# Patient Record
Sex: Female | Born: 1945
Health system: Southern US, Community
[De-identification: ages and names within clinical notes are randomized; demographics above are authoritative.]

## PROBLEM LIST (undated history)

## (undated) DIAGNOSIS — I10 Essential (primary) hypertension: Secondary | ICD-10-CM

## (undated) DIAGNOSIS — K449 Diaphragmatic hernia without obstruction or gangrene: Secondary | ICD-10-CM

## (undated) DIAGNOSIS — K224 Dyskinesia of esophagus: Secondary | ICD-10-CM

## (undated) DIAGNOSIS — I4891 Unspecified atrial fibrillation: Secondary | ICD-10-CM

## (undated) HISTORY — DX: Essential (primary) hypertension: I10

## (undated) HISTORY — DX: Dyskinesia of esophagus: K22.4

## (undated) HISTORY — DX: Diaphragmatic hernia without obstruction or gangrene: K44.9

## (undated) HISTORY — DX: Unspecified atrial fibrillation: I48.91

---
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
## 1988-01-05 HISTORY — PX: ABDOMINAL HYSTERECTOMY: SHX81

## 2000-01-26 ENCOUNTER — Encounter: Admission: RE | Admit: 2000-01-26 | Discharge: 2000-01-26 | Payer: Self-pay | Admitting: Internal Medicine

## 2000-01-26 ENCOUNTER — Encounter: Payer: Self-pay | Admitting: Obstetrics and Gynecology

## 2000-03-02 ENCOUNTER — Encounter: Admission: RE | Admit: 2000-03-02 | Discharge: 2000-03-02 | Payer: Self-pay | Admitting: Obstetrics and Gynecology

## 2000-03-02 ENCOUNTER — Encounter: Payer: Self-pay | Admitting: Obstetrics and Gynecology

## 2000-04-04 ENCOUNTER — Encounter: Admission: RE | Admit: 2000-04-04 | Discharge: 2000-04-04 | Payer: Self-pay | Admitting: Internal Medicine

## 2000-04-04 ENCOUNTER — Encounter: Payer: Self-pay | Admitting: Internal Medicine

## 2000-07-28 ENCOUNTER — Encounter: Payer: Self-pay | Admitting: Obstetrics and Gynecology

## 2000-07-28 ENCOUNTER — Encounter: Admission: RE | Admit: 2000-07-28 | Discharge: 2000-07-28 | Payer: Self-pay | Admitting: Obstetrics and Gynecology

## 2000-09-26 ENCOUNTER — Other Ambulatory Visit: Admission: RE | Admit: 2000-09-26 | Discharge: 2000-09-26 | Payer: Self-pay | Admitting: Obstetrics and Gynecology

## 2001-02-07 ENCOUNTER — Encounter: Payer: Self-pay | Admitting: *Deleted

## 2001-02-07 ENCOUNTER — Encounter: Admission: RE | Admit: 2001-02-07 | Discharge: 2001-02-07 | Payer: Self-pay | Admitting: *Deleted

## 2001-09-08 LAB — HM PAP SMEAR

## 2001-09-18 ENCOUNTER — Other Ambulatory Visit: Admission: RE | Admit: 2001-09-18 | Discharge: 2001-09-18 | Payer: Self-pay | Admitting: *Deleted

## 2002-03-22 ENCOUNTER — Encounter: Payer: Self-pay | Admitting: *Deleted

## 2002-03-22 ENCOUNTER — Encounter: Admission: RE | Admit: 2002-03-22 | Discharge: 2002-03-22 | Payer: Self-pay | Admitting: *Deleted

## 2002-12-13 ENCOUNTER — Encounter: Admission: RE | Admit: 2002-12-13 | Discharge: 2002-12-13 | Payer: Self-pay | Admitting: *Deleted

## 2004-01-09 ENCOUNTER — Encounter: Admission: RE | Admit: 2004-01-09 | Discharge: 2004-01-09 | Payer: Self-pay | Admitting: Internal Medicine

## 2004-12-01 ENCOUNTER — Encounter: Admission: RE | Admit: 2004-12-01 | Discharge: 2004-12-01 | Payer: Self-pay | Admitting: Obstetrics and Gynecology

## 2004-12-21 ENCOUNTER — Encounter: Admission: RE | Admit: 2004-12-21 | Discharge: 2004-12-21 | Payer: Self-pay | Admitting: Internal Medicine

## 2004-12-31 ENCOUNTER — Encounter: Admission: RE | Admit: 2004-12-31 | Discharge: 2004-12-31 | Payer: Self-pay | Admitting: Internal Medicine

## 2005-01-11 ENCOUNTER — Ambulatory Visit (HOSPITAL_COMMUNITY): Admission: RE | Admit: 2005-01-11 | Discharge: 2005-01-11 | Payer: Self-pay | Admitting: Internal Medicine

## 2005-01-15 ENCOUNTER — Ambulatory Visit (HOSPITAL_COMMUNITY): Admission: RE | Admit: 2005-01-15 | Discharge: 2005-01-15 | Payer: Self-pay | Admitting: Internal Medicine

## 2005-08-12 ENCOUNTER — Encounter: Admission: RE | Admit: 2005-08-12 | Discharge: 2005-08-12 | Payer: Self-pay | Admitting: Internal Medicine

## 2005-12-15 ENCOUNTER — Encounter: Admission: RE | Admit: 2005-12-15 | Discharge: 2005-12-15 | Payer: Self-pay | Admitting: Obstetrics and Gynecology

## 2007-01-11 ENCOUNTER — Encounter: Admission: RE | Admit: 2007-01-11 | Discharge: 2007-01-11 | Payer: Self-pay | Admitting: Internal Medicine

## 2008-01-16 ENCOUNTER — Encounter: Admission: RE | Admit: 2008-01-16 | Discharge: 2008-01-16 | Payer: Self-pay | Admitting: Internal Medicine

## 2008-10-02 ENCOUNTER — Ambulatory Visit (HOSPITAL_COMMUNITY): Admission: RE | Admit: 2008-10-02 | Discharge: 2008-10-02 | Payer: Self-pay | Admitting: Gastroenterology

## 2008-10-02 ENCOUNTER — Encounter (INDEPENDENT_AMBULATORY_CARE_PROVIDER_SITE_OTHER): Payer: Self-pay | Admitting: Gastroenterology

## 2009-02-06 ENCOUNTER — Encounter: Admission: RE | Admit: 2009-02-06 | Discharge: 2009-02-06 | Payer: Self-pay | Admitting: Obstetrics and Gynecology

## 2009-03-04 ENCOUNTER — Ambulatory Visit: Payer: Self-pay | Admitting: Internal Medicine

## 2009-03-18 ENCOUNTER — Ambulatory Visit: Payer: Self-pay | Admitting: Internal Medicine

## 2009-09-02 ENCOUNTER — Ambulatory Visit: Payer: Self-pay | Admitting: Internal Medicine

## 2010-01-24 ENCOUNTER — Other Ambulatory Visit: Payer: Self-pay | Admitting: Internal Medicine

## 2010-01-24 DIAGNOSIS — Z1239 Encounter for other screening for malignant neoplasm of breast: Secondary | ICD-10-CM

## 2010-02-10 ENCOUNTER — Ambulatory Visit
Admission: RE | Admit: 2010-02-10 | Discharge: 2010-02-10 | Disposition: A | Discharge status: Home / Self Care | Payer: Commercial Managed Care - PPO | Source: Ambulatory Visit | Attending: Internal Medicine | Admitting: Internal Medicine

## 2010-02-10 DIAGNOSIS — Z1239 Encounter for other screening for malignant neoplasm of breast: Secondary | ICD-10-CM

## 2010-02-24 ENCOUNTER — Ambulatory Visit: Payer: Self-pay | Admitting: Internal Medicine

## 2010-03-27 ENCOUNTER — Ambulatory Visit (INDEPENDENT_AMBULATORY_CARE_PROVIDER_SITE_OTHER): Payer: Commercial Managed Care - PPO | Admitting: Internal Medicine

## 2010-03-27 DIAGNOSIS — I10 Essential (primary) hypertension: Secondary | ICD-10-CM

## 2010-03-27 DIAGNOSIS — K219 Gastro-esophageal reflux disease without esophagitis: Secondary | ICD-10-CM

## 2010-03-27 DIAGNOSIS — E119 Type 2 diabetes mellitus without complications: Secondary | ICD-10-CM

## 2010-03-27 DIAGNOSIS — F411 Generalized anxiety disorder: Secondary | ICD-10-CM

## 2010-10-06 ENCOUNTER — Encounter: Payer: Self-pay | Admitting: Internal Medicine

## 2010-10-08 ENCOUNTER — Other Ambulatory Visit: Payer: 59 | Admitting: Internal Medicine

## 2010-10-08 DIAGNOSIS — Z0000 Encounter for general adult medical examination without abnormal findings: Secondary | ICD-10-CM

## 2010-10-08 DIAGNOSIS — E119 Type 2 diabetes mellitus without complications: Secondary | ICD-10-CM

## 2010-10-08 LAB — COMPREHENSIVE METABOLIC PANEL
ALT: 22 U/L (ref 0–35)
AST: 22 U/L (ref 0–37)
Albumin: 4.5 g/dL (ref 3.5–5.2)
Alkaline Phosphatase: 145 U/L — ABNORMAL HIGH (ref 39–117)
BUN: 11 mg/dL (ref 6–23)
CO2: 27 mEq/L (ref 19–32)
Calcium: 9.5 mg/dL (ref 8.4–10.5)
Chloride: 102 mEq/L (ref 96–112)
Creat: 0.73 mg/dL (ref 0.50–1.10)
Glucose, Bld: 129 mg/dL — ABNORMAL HIGH (ref 70–99)
Potassium: 4.8 mEq/L (ref 3.5–5.3)
Sodium: 141 mEq/L (ref 135–145)
Total Bilirubin: 0.4 mg/dL (ref 0.3–1.2)
Total Protein: 6.8 g/dL (ref 6.0–8.3)

## 2010-10-08 LAB — CBC WITH DIFFERENTIAL/PLATELET
Basophils Absolute: 0 10*3/uL (ref 0.0–0.1)
Basophils Relative: 1 % (ref 0–1)
Eosinophils Absolute: 0.1 10*3/uL (ref 0.0–0.7)
Eosinophils Relative: 2 % (ref 0–5)
HCT: 45 % (ref 36.0–46.0)
Hemoglobin: 14.9 g/dL (ref 12.0–15.0)
Lymphocytes Relative: 24 % (ref 12–46)
Lymphs Abs: 1.6 10*3/uL (ref 0.7–4.0)
MCH: 30.4 pg (ref 26.0–34.0)
MCHC: 33.1 g/dL (ref 30.0–36.0)
MCV: 91.8 fL (ref 78.0–100.0)
Monocytes Absolute: 0.4 10*3/uL (ref 0.1–1.0)
Monocytes Relative: 6 % (ref 3–12)
Neutro Abs: 4.5 10*3/uL (ref 1.7–7.7)
Neutrophils Relative %: 68 % (ref 43–77)
Platelets: 251 10*3/uL (ref 150–400)
RBC: 4.9 MIL/uL (ref 3.87–5.11)
RDW: 14 % (ref 11.5–15.5)
WBC: 6.6 10*3/uL (ref 4.0–10.5)

## 2010-10-08 LAB — LIPID PANEL
Cholesterol: 183 mg/dL (ref 0–200)
HDL: 51 mg/dL (ref >39)
LDL Cholesterol: 111 mg/dL — ABNORMAL HIGH (ref 0–99)
Total CHOL/HDL Ratio: 3.6 Ratio
Triglycerides: 103 mg/dL (ref <150)
VLDL: 21 mg/dL (ref 0–40)

## 2010-10-08 LAB — HEMOGLOBIN A1C
Hgb A1c MFr Bld: 6.4 % — ABNORMAL HIGH (ref <5.7)
Mean Plasma Glucose: 137 mg/dL — ABNORMAL HIGH (ref <117)

## 2010-10-08 LAB — TSH: TSH: 1.05 u[IU]/mL (ref 0.350–4.500)

## 2010-10-09 ENCOUNTER — Encounter: Payer: Self-pay | Admitting: Internal Medicine

## 2010-10-09 ENCOUNTER — Ambulatory Visit (INDEPENDENT_AMBULATORY_CARE_PROVIDER_SITE_OTHER): Payer: 59 | Admitting: Internal Medicine

## 2010-10-09 ENCOUNTER — Ambulatory Visit (HOSPITAL_COMMUNITY)
Admission: RE | Admit: 2010-10-09 | Discharge: 2010-10-09 | Disposition: A | Discharge status: Home / Self Care | Payer: 59 | Source: Ambulatory Visit | Attending: Internal Medicine | Admitting: Internal Medicine

## 2010-10-09 VITALS — BP 116/80 | HR 72 | Temp 99.0°F | Ht 63.0 in | Wt 261.0 lb

## 2010-10-09 DIAGNOSIS — K224 Dyskinesia of esophagus: Secondary | ICD-10-CM

## 2010-10-09 DIAGNOSIS — Z0000 Encounter for general adult medical examination without abnormal findings: Secondary | ICD-10-CM

## 2010-10-09 DIAGNOSIS — R0609 Other forms of dyspnea: Secondary | ICD-10-CM | POA: Insufficient documentation

## 2010-10-09 DIAGNOSIS — E119 Type 2 diabetes mellitus without complications: Secondary | ICD-10-CM

## 2010-10-09 DIAGNOSIS — R0989 Other specified symptoms and signs involving the circulatory and respiratory systems: Secondary | ICD-10-CM | POA: Insufficient documentation

## 2010-10-09 DIAGNOSIS — I10 Essential (primary) hypertension: Secondary | ICD-10-CM

## 2010-10-09 DIAGNOSIS — K209 Esophagitis, unspecified without bleeding: Secondary | ICD-10-CM

## 2010-10-09 DIAGNOSIS — R0600 Dyspnea, unspecified: Secondary | ICD-10-CM

## 2010-10-09 LAB — POCT URINALYSIS DIPSTICK
Bilirubin, UA: NEGATIVE
Blood, UA: NEGATIVE
Glucose, UA: NEGATIVE
Leukocytes, UA: NEGATIVE
Nitrite, UA: NEGATIVE
Protein, UA: NEGATIVE
Spec Grav, UA: 1.01
Urobilinogen, UA: NEGATIVE
pH, UA: 6

## 2010-10-09 LAB — VITAMIN D 25 HYDROXY (VIT D DEFICIENCY, FRACTURES): Vit D, 25-Hydroxy: 88 ng/mL (ref 30–89)

## 2010-11-04 DIAGNOSIS — K209 Esophagitis, unspecified without bleeding: Secondary | ICD-10-CM | POA: Insufficient documentation

## 2010-11-04 DIAGNOSIS — E119 Type 2 diabetes mellitus without complications: Secondary | ICD-10-CM | POA: Insufficient documentation

## 2010-11-04 DIAGNOSIS — R0600 Dyspnea, unspecified: Secondary | ICD-10-CM | POA: Insufficient documentation

## 2010-11-04 DIAGNOSIS — K224 Dyskinesia of esophagus: Secondary | ICD-10-CM | POA: Insufficient documentation

## 2010-11-04 DIAGNOSIS — I10 Essential (primary) hypertension: Secondary | ICD-10-CM | POA: Insufficient documentation

## 2010-11-04 NOTE — Progress Notes (Signed)
  Subjective:    Patient ID: Ebony Ashley, female    DOB: 1945-03-10, 65 y.o.   MRN: [id number]  HPI 65 year old white female with history of esophageal spasm, esophagitis, diabetes mellitus and hypertension for health maintenance and evaluation of medical problems. History of fractured left lower leg 1998. Total abdominal hysterectomy without oophorectomy for menorrhagia in 1991. Has had 2 D&C procedures. Had colonoscopy 2009 by Dr. Loreta Ave. Diabetic eye exam December 2010. Was reminded to do this. Tetanus immunization 2007, Pneumovax immunization August 2011. Patient complaining of some anterior chest pain. She is followed by [facility] [place]    Review of Systems  Constitutional: Negative.   HENT: Negative.   Eyes: Negative.   Cardiovascular: Positive for chest pain. Negative for palpitations and leg swelling.  Gastrointestinal: Negative.   Genitourinary: Negative.   Musculoskeletal: Negative.   Neurological: Negative.   Hematological: Negative.   Psychiatric/Behavioral: Negative.        Objective:   Physical Exam  Vitals reviewed. Constitutional: She is oriented to person, place, and time. She appears well-nourished.  HENT:  Head: Normocephalic and atraumatic.  Right Ear: External ear normal.  Left Ear: External ear normal.  Mouth/Throat: Oropharynx is clear and moist.  Eyes: Conjunctivae and EOM are normal. Pupils are equal, round, and reactive to light. No scleral icterus.  Neck: Normal range of motion. Neck supple. No JVD present. No thyromegaly present.       No carotid bruits  Cardiovascular: Normal rate and regular rhythm.   Murmur heard. Pulmonary/Chest: Effort normal and breath sounds normal. She has no wheezes. She has no rales.       Breasts normal female  Abdominal: Soft. Bowel sounds are normal. She exhibits no mass. There is no tenderness.  Musculoskeletal: She exhibits no edema.  Neurological: She is alert and oriented to person, place, and time. She  has normal reflexes. No cranial nerve deficit. Coordination normal.  Skin: Skin is warm and dry.  Psychiatric: She has a normal mood and affect. Her behavior is normal.          Assessment & Plan:  Hypertension  Diabetes mellitus  Recent episode of DOE  History of esophagitis and esophageal spasm  Systolic ejection murmur not previously noted  Plan: Cardiology evaluation. EKG performed today shows no change from EKG 2008

## 2010-11-04 NOTE — Patient Instructions (Signed)
Please continue to watch diet and try to exercise. We will arrange cardiology evaluation regarding shortness of breath and cardiac murmur. Return in 6 months for office visit in hemoglobin A 1C

## 2010-11-05 ENCOUNTER — Telehealth: Payer: Self-pay

## 2010-11-05 DIAGNOSIS — R011 Cardiac murmur, unspecified: Secondary | ICD-10-CM

## 2010-11-05 NOTE — Telephone Encounter (Signed)
Ambulatory order to Cardiology entered in Epic

## 2011-01-01 ENCOUNTER — Telehealth: Payer: Self-pay

## 2011-01-01 DIAGNOSIS — R011 Cardiac murmur, unspecified: Secondary | ICD-10-CM

## 2011-01-01 NOTE — Telephone Encounter (Signed)
Patient scheduled for an appointment with Dr. Antoine Poche at [facility] on Feb 09, 2011 at 3:30pm. Patient informed via mail.

## 2011-01-07 ENCOUNTER — Telehealth: Payer: Self-pay

## 2011-01-07 NOTE — Telephone Encounter (Signed)
Patient scheduled for an appointment with Dr. Antoine Poche on Feb 09, 2011 at 3:30 pm. Note mailed to her with this information.

## 2011-02-09 ENCOUNTER — Institutional Professional Consult (permissible substitution): Payer: 59 | Admitting: Cardiology

## 2011-03-01 ENCOUNTER — Encounter: Payer: Self-pay | Admitting: Cardiology

## 2011-03-01 ENCOUNTER — Ambulatory Visit (INDEPENDENT_AMBULATORY_CARE_PROVIDER_SITE_OTHER): Payer: 59 | Admitting: Cardiology

## 2011-03-01 VITALS — BP 140/80 | HR 57 | Ht 65.0 in | Wt 272.0 lb

## 2011-03-01 DIAGNOSIS — E669 Obesity, unspecified: Secondary | ICD-10-CM

## 2011-03-01 DIAGNOSIS — R011 Cardiac murmur, unspecified: Secondary | ICD-10-CM

## 2011-03-01 DIAGNOSIS — R0600 Dyspnea, unspecified: Secondary | ICD-10-CM

## 2011-03-01 DIAGNOSIS — I10 Essential (primary) hypertension: Secondary | ICD-10-CM

## 2011-03-01 NOTE — Assessment & Plan Note (Signed)
I suspect some mild septal hypertrophy.  I will order an echo and follow as needed based on these results.

## 2011-03-01 NOTE — Assessment & Plan Note (Signed)
The patient understands the need to lose weight with diet and exercise. We have discussed specific strategies for this.  

## 2011-03-01 NOTE — Progress Notes (Signed)
HPI  The patient presents for evaluation of a heart murmur. She has no prior cardiac history. She was told many years ago she had a heart murmur but Dr. Lenord Fellers noticed this recently as a new finding. The patient reports having an echo possibly a few years ago but doesn't recall the results. She's had no other cardiovascular testing. She does not exercise routinely but she takes care of her home. She might notice some shortness of breath when she's walking a moderate distance or up stairs. This is not new. She does not describe PND or orthopnea. She does not describe chest pressure, neck or arm discomfort. She doesn't notice palpitations, presyncope or syncope. She has had no edema.   Allergies  Allergen Reactions  . Adhesive (Tape) Rash  . Polysporin (Bacitracin-Polymyxin B) Rash    Current Outpatient Prescriptions  Medication Sig Dispense Refill  . amLODipine (NORVASC) 10 MG tablet Take 10 mg by mouth daily.        . Ascorbic Acid (VITAMIN C) 1000 MG tablet Take 1,000 mg by mouth daily.      Marland Kitchen CALCIUM PO Take 1,500 mg by mouth daily. Three tabs daily      . cholecalciferol (VITAMIN D) 1000 UNITS tablet Take 1,000 Units by mouth daily.        Marland Kitchen esomeprazole (NEXIUM) 40 MG capsule Take 40 mg by mouth daily before breakfast.        . fish oil-omega-3 fatty acids 1000 MG capsule Take 2 g by mouth daily.        Marland Kitchen FLAXSEED, LINSEED, PO Take by mouth daily.      . metoprolol (TOPROL-XL) 100 MG 24 hr tablet Take 100 mg by mouth daily.        . nitroGLYCERIN (NITROSTAT) 0.4 MG SL tablet Place 0.4 mg under the tongue every 5 (five) minutes as needed.        . solifenacin (VESICARE) 5 MG tablet Take 10 mg by mouth daily.        . vitamin E (VITAMIN E) 400 UNIT capsule Take 400 Units by mouth daily.        Past Medical History  Diagnosis Date  . Diabetes mellitus   . Esophageal spasm   . Esophagitis   . Hypertension     Past Surgical History  Procedure Date  . Abdominal hysterectomy   .  Tonsillectomy and adenoidectomy     Family History  Problem Relation Age of Onset  . Arthritis Mother   . Atrial fibrillation Mother 37    History   Social History  . Marital Status: Married    Spouse Name: N/A    Number of Children: N/A  . Years of Education: N/A   Occupational History  . Not on file.   Social History Main Topics  . Smoking status: Never Smoker   . Smokeless tobacco: Never Used  . Alcohol Use: Yes     socially  . Drug Use: No  . Sexually Active: Not on file   Other Topics Concern  . Not on file   Social History Narrative  . No narrative on file    ROS:  Positive for reflux, elbow and wrist pain, varicose veins. Otherwise as stated in the HPI and negative for all other systems.  PHYSICAL EXAM BP 140/80  Pulse 57  Ht 5\' 5"  (1.651 m)  Wt 272 lb (123.378 kg)  BMI 45.26 kg/m2 GENERAL:  Well appearing HEENT:  Pupils equal round and reactive, fundi  not visualized, oral mucosa unremarkable NECK:  No jugular venous distention, waveform within normal limits, carotid upstroke brisk and symmetric, no bruits, no thyromegaly LYMPHATICS:  No cervical, inguinal adenopathy LUNGS:  Clear to auscultation bilaterally BACK:  No CVA tenderness CHEST:  Unremarkable HEART:  PMI not displaced or sustained,S1 and S2 within normal limits, no S3, no S4, no clicks, no rubs, apical systolic radiating out the outflow tract early peaking murmur increased slightly with the strain phase of Valsalva.   ABD:  Flat, positive bowel sounds normal in frequency in pitch, no bruits, no rebound, no guarding, no midline pulsatile mass, no hepatomegaly, no splenomegaly EXT:  2 plus pulses throughout, no edema, no cyanosis no clubbing SKIN:  No rashes no nodules NEURO:  Cranial nerves II through XII grossly intact, motor grossly intact throughout PSYCH:  Cognitively intact, oriented to person place and time  EKG:  Sinus rhythm, rate 57, axis within normal limits, intervals within normal  limits, no acute ST-T wave changes.   ASSESSMENT AND PLAN

## 2011-03-01 NOTE — Assessment & Plan Note (Signed)
Her blood pressure is upper limits of normal. I reviewed this with her and suggested followup surveillance.

## 2011-03-01 NOTE — Patient Instructions (Signed)
The current medical regimen is effective;  continue present plan and medications.  Your physician has requested that you have an echocardiogram. Echocardiography is a painless test that uses sound waves to create images of your heart. It provides your doctor with information about the size and shape of your heart and how well your heart's chambers and valves are working. This procedure takes approximately one hour. There are no restrictions for this procedure.  Follow up in 6 months with Dr Hochrein.  You will receive a letter in the mail 2 months before you are due.  Please call us when you receive this letter to schedule your follow up appointment.  

## 2011-03-01 NOTE — Assessment & Plan Note (Signed)
I suspect this is related to weight and deconditioning. We discussed the specific strategy to include diet and exercise. If her breathing does not improve with this I would need to further evaluate.

## 2011-03-03 ENCOUNTER — Other Ambulatory Visit: Payer: Self-pay | Admitting: Internal Medicine

## 2011-03-03 DIAGNOSIS — Z1231 Encounter for screening mammogram for malignant neoplasm of breast: Secondary | ICD-10-CM

## 2011-03-09 ENCOUNTER — Ambulatory Visit
Admission: RE | Admit: 2011-03-09 | Discharge: 2011-03-09 | Disposition: A | Discharge status: Home / Self Care | Payer: 59 | Source: Ambulatory Visit | Attending: Internal Medicine | Admitting: Internal Medicine

## 2011-03-09 DIAGNOSIS — Z1231 Encounter for screening mammogram for malignant neoplasm of breast: Secondary | ICD-10-CM

## 2011-03-10 ENCOUNTER — Ambulatory Visit (HOSPITAL_COMMUNITY): Payer: 59 | Attending: Cardiology

## 2011-03-10 ENCOUNTER — Other Ambulatory Visit: Payer: Self-pay

## 2011-03-10 DIAGNOSIS — R0609 Other forms of dyspnea: Secondary | ICD-10-CM | POA: Insufficient documentation

## 2011-03-10 DIAGNOSIS — E119 Type 2 diabetes mellitus without complications: Secondary | ICD-10-CM | POA: Insufficient documentation

## 2011-03-10 DIAGNOSIS — R0989 Other specified symptoms and signs involving the circulatory and respiratory systems: Secondary | ICD-10-CM | POA: Insufficient documentation

## 2011-03-10 DIAGNOSIS — R011 Cardiac murmur, unspecified: Secondary | ICD-10-CM | POA: Insufficient documentation

## 2011-03-10 DIAGNOSIS — I10 Essential (primary) hypertension: Secondary | ICD-10-CM | POA: Insufficient documentation

## 2011-03-10 DIAGNOSIS — E669 Obesity, unspecified: Secondary | ICD-10-CM | POA: Insufficient documentation

## 2011-04-08 ENCOUNTER — Other Ambulatory Visit: Payer: 59 | Admitting: Internal Medicine

## 2011-04-08 LAB — HEMOGLOBIN A1C
Hgb A1c MFr Bld: 6.8 % — ABNORMAL HIGH (ref <5.7)
Mean Plasma Glucose: 148 mg/dL — ABNORMAL HIGH (ref <117)

## 2011-04-12 ENCOUNTER — Ambulatory Visit: Payer: 59 | Admitting: Internal Medicine

## 2011-04-14 ENCOUNTER — Ambulatory Visit (INDEPENDENT_AMBULATORY_CARE_PROVIDER_SITE_OTHER): Payer: 59 | Admitting: Internal Medicine

## 2011-04-14 ENCOUNTER — Encounter: Payer: Self-pay | Admitting: Internal Medicine

## 2011-04-14 VITALS — BP 136/90 | HR 68 | Temp 98.9°F | Wt 272.0 lb

## 2011-04-14 DIAGNOSIS — I10 Essential (primary) hypertension: Secondary | ICD-10-CM

## 2011-04-14 DIAGNOSIS — R5381 Other malaise: Secondary | ICD-10-CM

## 2011-04-14 DIAGNOSIS — R5383 Other fatigue: Secondary | ICD-10-CM

## 2011-04-14 DIAGNOSIS — E669 Obesity, unspecified: Secondary | ICD-10-CM

## 2011-04-14 DIAGNOSIS — E119 Type 2 diabetes mellitus without complications: Secondary | ICD-10-CM

## 2011-04-15 LAB — COMPREHENSIVE METABOLIC PANEL
ALT: 17 U/L (ref 0–35)
AST: 21 U/L (ref 0–37)
Albumin: 4.4 g/dL (ref 3.5–5.2)
Alkaline Phosphatase: 152 U/L — ABNORMAL HIGH (ref 39–117)
BUN: 14 mg/dL (ref 6–23)
CO2: 28 mEq/L (ref 19–32)
Calcium: 8.8 mg/dL (ref 8.4–10.5)
Chloride: 108 mEq/L (ref 96–112)
Creat: 0.62 mg/dL (ref 0.50–1.10)
Glucose, Bld: 101 mg/dL — ABNORMAL HIGH (ref 70–99)
Potassium: 4.5 mEq/L (ref 3.5–5.3)
Sodium: 142 mEq/L (ref 135–145)
Total Bilirubin: 0.2 mg/dL — ABNORMAL LOW (ref 0.3–1.2)
Total Protein: 6.3 g/dL (ref 6.0–8.3)

## 2011-04-15 LAB — CBC WITH DIFFERENTIAL/PLATELET
Basophils Absolute: 0 10*3/uL (ref 0.0–0.1)
Basophils Relative: 0 % (ref 0–1)
Eosinophils Absolute: 0.1 10*3/uL (ref 0.0–0.7)
Eosinophils Relative: 1 % (ref 0–5)
HCT: 42.9 % (ref 36.0–46.0)
Hemoglobin: 14.1 g/dL (ref 12.0–15.0)
Lymphocytes Relative: 23 % (ref 12–46)
Lymphs Abs: 1.8 10*3/uL (ref 0.7–4.0)
MCH: 30.1 pg (ref 26.0–34.0)
MCHC: 32.9 g/dL (ref 30.0–36.0)
MCV: 91.7 fL (ref 78.0–100.0)
Monocytes Absolute: 0.6 10*3/uL (ref 0.1–1.0)
Monocytes Relative: 7 % (ref 3–12)
Neutro Abs: 5.3 10*3/uL (ref 1.7–7.7)
Neutrophils Relative %: 68 % (ref 43–77)
Platelets: 251 10*3/uL (ref 150–400)
RBC: 4.68 MIL/uL (ref 3.87–5.11)
RDW: 14.5 % (ref 11.5–15.5)
WBC: 7.7 10*3/uL (ref 4.0–10.5)

## 2011-04-15 LAB — TSH: TSH: 1.244 u[IU]/mL (ref 0.350–4.500)

## 2011-04-26 ENCOUNTER — Other Ambulatory Visit: Payer: Self-pay | Admitting: Internal Medicine

## 2011-05-02 ENCOUNTER — Encounter: Payer: Self-pay | Admitting: Internal Medicine

## 2011-05-02 NOTE — Patient Instructions (Signed)
Continue same medications and return in 6 months 

## 2011-05-02 NOTE — Progress Notes (Signed)
  Subjective:    Patient ID: Ebony Ashley, female    DOB: 01-13-1945, 66 y.o.   MRN: [id number]  HPI 66 year old white female with history of diabetes mellitus, obesity, hypertension, esophagitis for six-month recheck. She takes Nexium, metoprolol, Vesicare for urinary incontinence, amlodipine, calcium supplement and vitamin D. Diabetes is currently diet controlled.    Review of Systems     Objective:   Physical Exam neck supple no JVD thyromegaly or carotid bruits. Chest clear to auscultation. Cardiac exam regular rate and rhythm. Extremities without edema. Diabetic foot exam no ulcers. Pulses intact.        Assessment & Plan:  Diet controlled diabetes mellitus type 2  Hypertension  Obesity  History of esophagitis  Plan: Patient is to return in 6 months for physical examination. Patient had CBC, TSH, C. met, hemoglobin A1c drawn. In 6 months she will just need a fasting lipid panel with hemoglobin A1c.

## 2011-06-28 ENCOUNTER — Other Ambulatory Visit: Payer: Self-pay | Admitting: Internal Medicine

## 2011-07-27 ENCOUNTER — Other Ambulatory Visit (HOSPITAL_COMMUNITY): Payer: Self-pay | Admitting: Orthopedic Surgery

## 2011-07-27 DIAGNOSIS — M25561 Pain in right knee: Secondary | ICD-10-CM

## 2011-07-29 ENCOUNTER — Other Ambulatory Visit: Payer: Self-pay

## 2011-07-29 ENCOUNTER — Inpatient Hospital Stay (HOSPITAL_COMMUNITY): Admission: RE | Admit: 2011-07-29 | Payer: 59 | Source: Ambulatory Visit

## 2011-07-29 MED ORDER — DIAZEPAM 10 MG PO TABS: 10.0000 mg | ORAL_TABLET | Freq: Two times a day (BID) | ORAL | Status: AC | PRN

## 2011-08-19 ENCOUNTER — Other Ambulatory Visit (HOSPITAL_COMMUNITY): Payer: 59

## 2011-09-10 ENCOUNTER — Encounter: Payer: Self-pay | Admitting: Internal Medicine

## 2011-09-10 ENCOUNTER — Ambulatory Visit (INDEPENDENT_AMBULATORY_CARE_PROVIDER_SITE_OTHER): Payer: 59 | Admitting: Internal Medicine

## 2011-09-10 VITALS — BP 124/92 | HR 60 | Temp 98.4°F | Ht 64.0 in | Wt 272.0 lb

## 2011-09-10 DIAGNOSIS — R32 Unspecified urinary incontinence: Secondary | ICD-10-CM

## 2011-09-10 DIAGNOSIS — F439 Reaction to severe stress, unspecified: Secondary | ICD-10-CM

## 2011-09-10 DIAGNOSIS — K219 Gastro-esophageal reflux disease without esophagitis: Secondary | ICD-10-CM

## 2011-09-10 DIAGNOSIS — F430 Acute stress reaction: Secondary | ICD-10-CM

## 2011-09-10 DIAGNOSIS — E785 Hyperlipidemia, unspecified: Secondary | ICD-10-CM

## 2011-09-10 DIAGNOSIS — E669 Obesity, unspecified: Secondary | ICD-10-CM

## 2011-09-10 DIAGNOSIS — Z8719 Personal history of other diseases of the digestive system: Secondary | ICD-10-CM

## 2011-09-10 DIAGNOSIS — E119 Type 2 diabetes mellitus without complications: Secondary | ICD-10-CM

## 2011-09-10 DIAGNOSIS — E8881 Metabolic syndrome: Secondary | ICD-10-CM

## 2011-09-10 LAB — LIPID PANEL
Cholesterol: 175 mg/dL (ref 0–200)
HDL: 44 mg/dL (ref >39)
LDL Cholesterol: 110 mg/dL — ABNORMAL HIGH (ref 0–99)
Total CHOL/HDL Ratio: 4 Ratio
Triglycerides: 104 mg/dL (ref <150)
VLDL: 21 mg/dL (ref 0–40)

## 2011-09-10 LAB — HEMOGLOBIN A1C
Hgb A1c MFr Bld: 6.9 % — ABNORMAL HIGH (ref <5.7)
Mean Plasma Glucose: 151 mg/dL — ABNORMAL HIGH (ref <117)

## 2011-09-10 NOTE — Progress Notes (Signed)
  Subjective:    Patient ID: Ebony Ashley, female    DOB: 02-Nov-1945, 66 y.o.   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 Her job was a limited dated and some cutbacks. This is been devastating to her self-esteem. Says her hair has been falling out and  her scalp has been tight. This is her six-month recheck appointment.         Review of Systems     Objective:   Physical Exam neck is supple without thyromegaly carotid bruits or adenopathy. Chest clear to auscultation. Cardiac exam regular rate and rhythm normal S1 and S2. Extremities: Trace lower extremity edema         Assessment & Plan:  Obesity  Situational stress  Urinary incontinence  GE reflux  Esophageal spasm  History of type 2 diabetes mellitus  Plan: At patient's request, Tenuate 75 mg dosepak and to take one tablet daily for appetite suppression. Also, we may substitute Protonix for Nexium if insurance will not cover this, we can substitute Ditropan for Vesicare if insurance will not cover this . Return in 6 months for physical exam.  Spent 30 minutes speaking with patient about job loss, situational stress, medical issues

## 2011-09-10 NOTE — Patient Instructions (Addendum)
Since your going on Medicare October 1, we can switch Nexium to Protonix if necessary. We can switch Vesicare to Protonix  if necessary. We will see in 6 months for physical exam. Continue diet exercise and weight loss.

## 2011-09-14 ENCOUNTER — Other Ambulatory Visit (HOSPITAL_COMMUNITY): Payer: Self-pay | Admitting: Orthopedic Surgery

## 2011-09-14 DIAGNOSIS — M25561 Pain in right knee: Secondary | ICD-10-CM

## 2011-09-15 ENCOUNTER — Ambulatory Visit (HOSPITAL_COMMUNITY)
Admission: RE | Admit: 2011-09-15 | Discharge: 2011-09-15 | Disposition: A | Discharge status: Home / Self Care | Payer: 59 | Source: Ambulatory Visit | Attending: Orthopedic Surgery | Admitting: Orthopedic Surgery

## 2011-09-15 DIAGNOSIS — M25561 Pain in right knee: Secondary | ICD-10-CM

## 2011-09-15 DIAGNOSIS — M23305 Other meniscus derangements, unspecified medial meniscus, unspecified knee: Secondary | ICD-10-CM | POA: Insufficient documentation

## 2011-09-15 DIAGNOSIS — M25569 Pain in unspecified knee: Secondary | ICD-10-CM | POA: Insufficient documentation

## 2011-09-24 ENCOUNTER — Other Ambulatory Visit: Payer: Self-pay | Admitting: Internal Medicine

## 2012-01-18 ENCOUNTER — Telehealth: Payer: Self-pay | Admitting: Internal Medicine

## 2012-01-18 ENCOUNTER — Other Ambulatory Visit: Payer: Self-pay

## 2012-01-18 MED ORDER — NEXIUM 40 MG PO CPDR: 40.0000 mg | DELAYED_RELEASE_CAPSULE | Freq: Every day | ORAL | Status: DC

## 2012-01-18 MED ORDER — METOPROLOL SUCCINATE ER 100 MG PO TB24: 100.0000 mg | ORAL_TABLET | Freq: Every day | ORAL | Status: DC

## 2012-01-18 MED ORDER — AMLODIPINE BESYLATE 10 MG PO TABS: 10.0000 mg | ORAL_TABLET | Freq: Every day | ORAL | Status: DC

## 2012-01-18 NOTE — Telephone Encounter (Signed)
I thought we gave her Rxs when she was here in late 2013. If not, please refill these for one year.

## 2012-02-23 LAB — HM DIABETES EYE EXAM

## 2012-07-28 ENCOUNTER — Other Ambulatory Visit: Payer: Self-pay

## 2012-07-28 MED ORDER — AMLODIPINE BESYLATE 10 MG PO TABS: 10.0000 mg | ORAL_TABLET | Freq: Every day | ORAL | Status: DC

## 2012-07-31 ENCOUNTER — Other Ambulatory Visit: Payer: Self-pay

## 2012-07-31 MED ORDER — METOPROLOL SUCCINATE ER 100 MG PO TB24: 100.0000 mg | ORAL_TABLET | Freq: Every day | ORAL | Status: DC

## 2012-09-13 ENCOUNTER — Encounter: Payer: Self-pay | Admitting: Nurse Practitioner

## 2012-09-13 ENCOUNTER — Other Ambulatory Visit: Payer: Self-pay

## 2012-09-13 ENCOUNTER — Ambulatory Visit (INDEPENDENT_AMBULATORY_CARE_PROVIDER_SITE_OTHER): Payer: MEDICARE | Admitting: Nurse Practitioner

## 2012-09-13 VITALS — BP 122/84 | HR 60 | Resp 16 | Ht 63.25 in | Wt 263.0 lb

## 2012-09-13 DIAGNOSIS — Z01419 Encounter for gynecological examination (general) (routine) without abnormal findings: Secondary | ICD-10-CM

## 2012-09-13 DIAGNOSIS — N3281 Overactive bladder: Secondary | ICD-10-CM

## 2012-09-13 DIAGNOSIS — E8881 Metabolic syndrome: Secondary | ICD-10-CM

## 2012-09-13 DIAGNOSIS — N318 Other neuromuscular dysfunction of bladder: Secondary | ICD-10-CM

## 2012-09-13 DIAGNOSIS — E669 Obesity, unspecified: Secondary | ICD-10-CM

## 2012-09-13 DIAGNOSIS — Z1231 Encounter for screening mammogram for malignant neoplasm of breast: Secondary | ICD-10-CM

## 2012-09-13 MED ORDER — SOLIFENACIN SUCCINATE 10 MG PO TABS: 10.0000 mg | ORAL_TABLET | Freq: Every day | ORAL | Status: DC

## 2012-09-13 NOTE — Patient Instructions (Addendum)

## 2012-09-13 NOTE — Progress Notes (Signed)
Patient ID: Ebony Ashley, female   DOB: 11-Oct-1945, 67 y.o.   MRN: [id number] 67 y.o. G3P2002 Married Caucasian Fe here for annual exam. Husband with recent illness of osteomyelitis and had to be on IV antibiotics for months.   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  OAB symptoms are about the same and doing well on Vesicare.  No LMP recorded. Patient has had a hysterectomy.          Sexually active: no  The current method of family planning is status post hysterectomy.    Exercising: no  The patient does not participate in regular exercise at present. Smoker:  no  Health Maintenance: Pap:  09/18/01, WNL MMG:  03/09/11, BI-Rads 1: negative scheduled 10/2 Colonoscopy:  09/2008, sigmoid diverticulitis, repeat 10 years EDG: 02/2008 gastric polyps benign BMD:   01/2010 T Score: spine 1.1; hip 1.4  TDaP:  09/2005 Labs: PCP   reports that she has never smoked. She has never used smokeless tobacco. She reports that  drinks alcohol. She reports that she does not use illicit drugs.  Past Medical History  Diagnosis Date  . Diabetes mellitus   . Esophageal spasm   . Esophagitis   . Hypertension   . Hiatal hernia     Past Surgical History  Procedure Laterality Date  . Abdominal hysterectomy  1990  . Tonsillectomy and adenoidectomy  1951    Current Outpatient Prescriptions  Medication Sig Dispense Refill  . amLODipine (NORVASC) 10 MG tablet Take 1 tablet (10 mg total) by mouth daily.  90 tablet  1  . Ascorbic Acid (VITAMIN C) 1000 MG tablet Take 1,000 mg by mouth daily.      Marland Kitchen CALCIUM PO Take 1,500 mg by mouth daily. Three tabs daily      . cholecalciferol (VITAMIN D) 1000 UNITS tablet Take 1,000 Units by mouth daily.        . fish oil-omega-3 fatty acids 1000 MG capsule Take 2 g by mouth daily.        Marland Kitchen FLAXSEED, LINSEED, PO Take by mouth daily.      . metoprolol succinate (TOPROL-XL) 100 MG 24 hr tablet Take 1 tablet (100 mg total) by mouth daily. Take with or  immediately following a meal.  90 tablet  1  . NEXIUM 40 MG capsule Take 1 capsule (40 mg total) by mouth daily before breakfast.  90 capsule  PRN  . nitroGLYCERIN (NITROSTAT) 0.4 MG SL tablet Place 0.4 mg under the tongue every 5 (five) minutes as needed.        . VESICARE 5 MG tablet TAKE 1 TABLET BY MOUTH ONCE A DAY AS DIRECTED  90 each  PRN  . vitamin E (VITAMIN E) 400 UNIT capsule Take 400 Units by mouth daily.       No current facility-administered medications for this visit.    Family History  Problem Relation Age of Onset  . Arthritis Mother   . Atrial fibrillation Mother 25  . Fibromyalgia Mother   . Osteoporosis Mother     ROS:  Pertinent items are noted in HPI.  Otherwise, a comprehensive ROS was negative.  Exam:   There were no vitals taken for this visit.    Ht Readings from Last 3 Encounters:  09/10/11 5\' 4"  (1.626 m)  03/01/11 5\' 5"  (1.651 m)  10/09/10 5\' 3"  (1.6 m)    General appearance: alert, cooperative and appears stated age Head: Normocephalic, without obvious abnormality, atraumatic  Neck: no adenopathy, supple, symmetrical, trachea midline and thyroid normal to inspection and palpation Lungs: clear to auscultation bilaterally Breasts: normal appearance, no masses or tenderness Heart: regular rate and rhythm Abdomen: soft, non-tender; no masses,  no organomegaly Extremities: extremities normal, atraumatic, no cyanosis or edema Skin: Skin color, texture, turgor normal. No rashes or lesions Lymph nodes: Cervical, supraclavicular, and axillary nodes normal. No abnormal inguinal nodes palpated Neurologic: Grossly normal   Pelvic: External genitalia:  no lesions              Urethra:  normal appearing urethra with no masses, tenderness or lesions              Bartholin's and Skene's: normal                 Vagina: normal appearing vagina with normal color and discharge, no lesions              Cervix: absent              Pap taken: no Bimanual Exam:   Uterus:  uterus absent              Adnexa: no mass, fullness, tenderness               Rectovaginal: Confirms               Anus:  normal sphincter tone, no lesions  A:  Well Woman with normal exam  S/P TAH secondary to DUB 1990, off ERT 04/2009  OAB - doing well on Vesicare  Obesity  Recent family stressors with husbands illness.    P:   Pap smear as per guidelines - not indicated  Mammogram due now and patient will schedule  Refill on Vesicare 10 mg for a year  counseled on breast self exam, adequate intake of calcium and vitamin D, diet and exercise return annually or prn  An After Visit Summary was printed and given to the patient.

## 2012-09-14 NOTE — Progress Notes (Signed)
Encounter reviewed by Dr. Brook Silva.  

## 2012-09-18 ENCOUNTER — Other Ambulatory Visit: Payer: MEDICARE | Admitting: Internal Medicine

## 2012-09-18 DIAGNOSIS — Z13220 Encounter for screening for lipoid disorders: Secondary | ICD-10-CM

## 2012-09-18 DIAGNOSIS — Z79899 Other long term (current) drug therapy: Secondary | ICD-10-CM

## 2012-09-18 DIAGNOSIS — Z1329 Encounter for screening for other suspected endocrine disorder: Secondary | ICD-10-CM

## 2012-09-18 DIAGNOSIS — E119 Type 2 diabetes mellitus without complications: Secondary | ICD-10-CM

## 2012-09-18 DIAGNOSIS — Z130 Encounter for screening for diseases of the blood and blood-forming organs and certain disorders involving the immune mechanism: Secondary | ICD-10-CM

## 2012-09-18 DIAGNOSIS — I10 Essential (primary) hypertension: Secondary | ICD-10-CM

## 2012-09-18 LAB — COMPREHENSIVE METABOLIC PANEL
ALT: 22 U/L (ref 0–35)
AST: 19 U/L (ref 0–37)
Albumin: 4.1 g/dL (ref 3.5–5.2)
Alkaline Phosphatase: 140 U/L — ABNORMAL HIGH (ref 39–117)
BUN: 14 mg/dL (ref 6–23)
CO2: 28 mEq/L (ref 19–32)
Calcium: 9.8 mg/dL (ref 8.4–10.5)
Chloride: 103 mEq/L (ref 96–112)
Creat: 0.7 mg/dL (ref 0.50–1.10)
Glucose, Bld: 136 mg/dL — ABNORMAL HIGH (ref 70–99)
Potassium: 4.7 mEq/L (ref 3.5–5.3)
Sodium: 139 mEq/L (ref 135–145)
Total Bilirubin: 0.4 mg/dL (ref 0.3–1.2)
Total Protein: 6.5 g/dL (ref 6.0–8.3)

## 2012-09-18 LAB — LIPID PANEL
Cholesterol: 166 mg/dL (ref 0–200)
HDL: 40 mg/dL (ref >39)
LDL Cholesterol: 104 mg/dL — ABNORMAL HIGH (ref 0–99)
Total CHOL/HDL Ratio: 4.2 Ratio
Triglycerides: 111 mg/dL (ref <150)
VLDL: 22 mg/dL (ref 0–40)

## 2012-09-18 LAB — CBC WITH DIFFERENTIAL/PLATELET
Basophils Absolute: 0 10*3/uL (ref 0.0–0.1)
Basophils Relative: 0 % (ref 0–1)
Eosinophils Absolute: 0.1 10*3/uL (ref 0.0–0.7)
Eosinophils Relative: 1 % (ref 0–5)
HCT: 45.6 % (ref 36.0–46.0)
Hemoglobin: 15.2 g/dL — ABNORMAL HIGH (ref 12.0–15.0)
Lymphocytes Relative: 19 % (ref 12–46)
Lymphs Abs: 1.3 10*3/uL (ref 0.7–4.0)
MCH: 29.1 pg (ref 26.0–34.0)
MCHC: 33.3 g/dL (ref 30.0–36.0)
MCV: 87.2 fL (ref 78.0–100.0)
Monocytes Absolute: 0.5 10*3/uL (ref 0.1–1.0)
Monocytes Relative: 7 % (ref 3–12)
Neutro Abs: 5.3 10*3/uL (ref 1.7–7.7)
Neutrophils Relative %: 73 % (ref 43–77)
Platelets: 269 10*3/uL (ref 150–400)
RBC: 5.23 MIL/uL — ABNORMAL HIGH (ref 3.87–5.11)
RDW: 15.3 % (ref 11.5–15.5)
WBC: 7.2 10*3/uL (ref 4.0–10.5)

## 2012-09-18 LAB — HEMOGLOBIN A1C
Hgb A1c MFr Bld: 7.1 % — ABNORMAL HIGH (ref <5.7)
Mean Plasma Glucose: 157 mg/dL — ABNORMAL HIGH (ref <117)

## 2012-09-19 ENCOUNTER — Encounter: Payer: Self-pay | Admitting: Internal Medicine

## 2012-09-19 ENCOUNTER — Ambulatory Visit (INDEPENDENT_AMBULATORY_CARE_PROVIDER_SITE_OTHER): Payer: MEDICARE | Admitting: Internal Medicine

## 2012-09-19 VITALS — BP 136/78 | HR 72 | Temp 97.8°F | Ht 63.5 in | Wt 262.0 lb

## 2012-09-19 DIAGNOSIS — E119 Type 2 diabetes mellitus without complications: Secondary | ICD-10-CM

## 2012-09-19 DIAGNOSIS — I10 Essential (primary) hypertension: Secondary | ICD-10-CM

## 2012-09-19 DIAGNOSIS — Z0000 Encounter for general adult medical examination without abnormal findings: Secondary | ICD-10-CM

## 2012-09-19 DIAGNOSIS — Z23 Encounter for immunization: Secondary | ICD-10-CM

## 2012-09-19 DIAGNOSIS — Z8719 Personal history of other diseases of the digestive system: Secondary | ICD-10-CM

## 2012-09-19 DIAGNOSIS — E669 Obesity, unspecified: Secondary | ICD-10-CM

## 2012-09-19 DIAGNOSIS — K219 Gastro-esophageal reflux disease without esophagitis: Secondary | ICD-10-CM

## 2012-09-19 LAB — TSH: TSH: 1.141 u[IU]/mL (ref 0.350–4.500)

## 2012-09-19 LAB — VITAMIN D 25 HYDROXY (VIT D DEFICIENCY, FRACTURES): Vit D, 25-Hydroxy: 86 ng/mL (ref 30–89)

## 2012-09-19 LAB — POCT URINALYSIS DIPSTICK
Bilirubin, UA: NEGATIVE
Blood, UA: NEGATIVE
Glucose, UA: NEGATIVE
Ketones, UA: NEGATIVE
Leukocytes, UA: NEGATIVE
Nitrite, UA: NEGATIVE
Protein, UA: NEGATIVE
Spec Grav, UA: <=1.005
Urobilinogen, UA: NEGATIVE
pH, UA: 6

## 2012-09-19 NOTE — Progress Notes (Signed)
Subjective:    Patient ID: Ebony Ashley, female    DOB: 1945-01-22, 67 y.o.   MRN: [id number]  HPI  67 year old White female for health maintenance and evaluation of medical problems. 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 She has a history of esophageal spasm, esophagitis, type 2 diabetes mellitus, and hypertension.  Past medical history: History of fractured left lower leg 1998. Total abdominal hysterectomy without oophorectomy for menorrhagia in 1991. Has had 2 D&C procedures in the past. Had colonoscopy in 2009 by Dr. Loreta Ave.  Tetanus immunization 2007, Pneumovax immunization August 2011.  History of urinary incontinence  GYN care done through [facility]   Social history: Patient does not smoke. Social alcohol consumption consisting of drinking a wine cooler on vacation. Patient graduated from [employer]. Formerly worked at [employer]. She is married. One adult daughter and one son.  Patient says Polysporin causes a rash and adhesives cause rash and irritation  Family history: Mother died with colon cancer at age 68. Father died at age 68 and now mobile accident. Stepbrother died at age 68 of a stroke. No sisters.  Patient had 2-D echocardiogram done by Dr. Baltazar Apo neck in 2009 showing mild left ventricular diastolic dysfunction  Patient had bone density study in 2010 with normal T-scores.    Review of Systems  Constitutional: Positive for fatigue.  HENT: Negative.   Eyes:       Wears  glasses  Respiratory: Negative.   Cardiovascular:       Some edema late day  Gastrointestinal: Negative.   Endocrine: Negative.   Genitourinary: Negative.   Allergic/Immunologic: Negative.   Neurological: Negative.   Hematological: Negative.   Psychiatric/Behavioral: Negative.        Objective:   Physical Exam  Vitals reviewed. Constitutional: She is oriented  to person, place, and time. She appears well-developed and well-nourished. No distress.  HENT:  Head: Normocephalic and atraumatic.  Right Ear: External ear normal.  Left Ear: External ear normal.  Mouth/Throat: Oropharynx is clear and moist. No oropharyngeal exudate.  Eyes: Conjunctivae and EOM are normal. Pupils are equal, round, and reactive to light. Right eye exhibits no discharge. Left eye exhibits no discharge. No scleral icterus.  Neck: No JVD present. No thyromegaly present.  Cardiovascular: Normal rate, regular rhythm, normal heart sounds and intact distal pulses.   No murmur heard. Pulmonary/Chest: Effort normal and breath sounds normal. No respiratory distress. She has no wheezes. She has no rales. She exhibits no tenderness.  Abdominal: Soft. Bowel sounds are normal. She exhibits no distension and no mass. There is no tenderness. There is no rebound and no guarding.  Genitourinary:  Pap deferred to [facility] s/p hysterectomy  Musculoskeletal: Normal range of motion.  Lymphadenopathy:    She has no cervical adenopathy.  Neurological: She is alert and oriented to person, place, and time. No cranial nerve deficit. Coordination normal.  Skin: Skin is warm and dry. No rash noted. She is not diaphoretic.  Psychiatric: She has a normal mood and affect. Her behavior is normal. Judgment and thought content normal.          Assessment & Plan:  History of GE reflux and esophagitis  Type 2 diabetes mellitus controlled  Hypertension-controlled  Obesity  Plan: Return in one year or as needed. Influenza immunization given today. Continue to monitor Accu-Cheks once daily. Try to maintain strict diet and  exercise regimen. Recommend annual eye exam and annual mammogram   Subjective:   Patient presents for Medicare Annual/Subsequent preventive examination.   Review Past Medical/Family/Social: see EPIC   Risk Factors  Current exercise habits: sedentary Dietary issues  discussed: low fat low carb  Cardiac risk factors: see above  Depression Screen  (Note: if answer to either of the following is "Yes", a more complete depression screening is indicated)   Over the past two weeks, have you felt down, depressed or hopeless? No  Over the past two weeks, have you felt little interest or pleasure in doing things? No Have you lost interest or pleasure in daily life? No Do you often feel hopeless? No Do you cry easily over simple problems? No   Activities of Daily Living  In your present state of health, do you have any difficulty performing the following activities?:   Driving? No  Managing money? No  Feeding yourself? No  Getting from bed to chair? No  Climbing a flight of stairs? No  Preparing food and eating?: No  Bathing or showering? No  Getting dressed: No  Getting to the toilet? No  Using the toilet:No  Moving around from place to place: No  In the past year have you fallen or had a near fall?:No  Are you sexually active? No  Do you have more than one partner? No   Hearing Difficulties: No  Do you often ask people to speak up or repeat themselves? No  Do you experience ringing or noises in your ears? No  Do you have difficulty understanding soft or whispered voices? No  Do you feel that you have a problem with memory? No Do you often misplace items? No    Home Safety:  Do you have a smoke alarm at your residence? Yes Do you have grab bars in the bathroom? no Do you have throw rugs in your house? yes   Cognitive Testing  Alert? Yes Normal Appearance?Yes  Oriented to person? Yes Place? Yes  Time? Yes  Recall of three objects? Yes  Can perform simple calculations? Yes  Displays appropriate judgment?Yes  Can read the correct time from a watch face?Yes   List the Names of Other Physician/Practitioners you currently use:  See referral list for the physicians patient is currently seeing.  Dr. Jimmey Ralph for eyes, [facility], NP for  GYN    Review of Systems: see EPIC   Objective:     General appearance: Appears stated age and mildly obese  Head: Normocephalic, without obvious abnormality, atraumatic  Eyes: conj clear, EOMi PEERLA  Ears: normal TM's and external ear canals both ears  Nose: Nares normal. Septum midline. Mucosa normal. No drainage or sinus tenderness.  Throat: lips, mucosa, and tongue normal; teeth and gums normal  Neck: no adenopathy, no carotid bruit, no JVD, supple, symmetrical, trachea midline and thyroid not enlarged, symmetric, no tenderness/mass/nodules  No CVA tenderness.  Lungs: clear to auscultation bilaterally  Breasts: normal appearance, no masses or tenderness. Heart: regular rate and rhythm, S1, S2 normal, no murmur, click, rub or gallop  Abdomen: soft, non-tender; bowel sounds normal; no masses, no organomegaly  Musculoskeletal: ROM normal in all joints, no crepitus, no deformity, Normal muscle strengthen. Back  is symmetric, no curvature. Skin: Skin color, texture, turgor normal. No rashes or lesions  Lymph nodes: Cervical, supraclavicular, and axillary nodes normal.  Neurologic: CN 2 -12 Normal, Normal symmetric reflexes. Normal coordination and gait  Psych: Alert & Oriented x 3, Mood  appear stable.    Assessment:    Annual wellness medicare exam   Plan:    During the course of the visit the patient was educated and counseled about appropriate screening and preventive services including:   Mammogram scheduled Flu vaccine given     Patient Instructions (the written plan) was given to the patient.  Medicare Attestation  I have personally reviewed:  The patient's medical and social history  Their use of alcohol, tobacco or illicit drugs  Their current medications and supplements  The patient's functional ability including ADLs,fall risks, home safety risks, cognitive, and hearing and visual impairment  Diet and physical activities  Evidence for depression or mood  disorders  The patient's weight, height, BMI, and visual acuity have been recorded in the chart. I have made referrals, counseling, and provided education to the patient based on review of the above and I have provided the patient with a written personalized care plan for preventive services.

## 2012-09-23 NOTE — Patient Instructions (Addendum)
Tried to be strict about diet exercise and weight loss. Return in one year or as needed. Continue same medications. Recommend annual diabetic eye exam. Recommend annual mammogram. Influenza vaccine given today.

## 2012-10-05 ENCOUNTER — Ambulatory Visit: Payer: 59

## 2012-10-10 ENCOUNTER — Ambulatory Visit
Admission: RE | Admit: 2012-10-10 | Discharge: 2012-10-10 | Disposition: A | Discharge status: Home / Self Care | Payer: Medicare Other | Source: Ambulatory Visit

## 2012-10-10 DIAGNOSIS — Z1231 Encounter for screening mammogram for malignant neoplasm of breast: Secondary | ICD-10-CM

## 2012-10-27 ENCOUNTER — Ambulatory Visit (INDEPENDENT_AMBULATORY_CARE_PROVIDER_SITE_OTHER): Payer: MEDICARE | Admitting: Internal Medicine

## 2012-10-27 DIAGNOSIS — Z23 Encounter for immunization: Secondary | ICD-10-CM

## 2013-03-05 ENCOUNTER — Telehealth: Payer: Self-pay | Admitting: Internal Medicine

## 2013-03-05 ENCOUNTER — Other Ambulatory Visit: Payer: Self-pay

## 2013-03-05 MED ORDER — METOPROLOL SUCCINATE ER 100 MG PO TB24: 100.0000 mg | ORAL_TABLET | Freq: Every day | ORAL | Status: DC

## 2013-03-05 MED ORDER — AMLODIPINE BESYLATE 10 MG PO TABS: 10.0000 mg | ORAL_TABLET | Freq: Every day | ORAL | Status: DC

## 2013-03-05 NOTE — Telephone Encounter (Signed)
Please refill.

## 2013-03-20 ENCOUNTER — Other Ambulatory Visit: Payer: MEDICARE | Admitting: Internal Medicine

## 2013-03-22 ENCOUNTER — Ambulatory Visit: Payer: MEDICARE | Admitting: Internal Medicine

## 2013-04-02 ENCOUNTER — Other Ambulatory Visit: Payer: Self-pay

## 2013-04-24 ENCOUNTER — Other Ambulatory Visit: Payer: MEDICARE | Admitting: Internal Medicine

## 2013-04-26 ENCOUNTER — Ambulatory Visit: Payer: MEDICARE | Admitting: Internal Medicine

## 2013-05-17 ENCOUNTER — Other Ambulatory Visit: Payer: MEDICARE | Admitting: Internal Medicine

## 2013-05-18 ENCOUNTER — Ambulatory Visit: Payer: MEDICARE | Admitting: Internal Medicine

## 2013-06-28 ENCOUNTER — Other Ambulatory Visit: Payer: MEDICARE | Admitting: Internal Medicine

## 2013-06-29 ENCOUNTER — Ambulatory Visit: Payer: MEDICARE | Admitting: Internal Medicine

## 2013-07-16 ENCOUNTER — Other Ambulatory Visit: Payer: Self-pay

## 2013-07-16 MED ORDER — METOPROLOL SUCCINATE ER 100 MG PO TB24: 100.0000 mg | ORAL_TABLET | Freq: Every day | ORAL | Status: DC

## 2013-07-16 MED ORDER — AMLODIPINE BESYLATE 10 MG PO TABS
10.0000 mg | ORAL_TABLET | Freq: Every day | ORAL | Status: DC
Start: 2013-07-16 — End: 2014-04-08

## 2013-08-30 ENCOUNTER — Other Ambulatory Visit: Payer: MEDICARE | Admitting: Internal Medicine

## 2013-08-31 ENCOUNTER — Ambulatory Visit: Payer: MEDICARE | Admitting: Internal Medicine

## 2013-09-18 ENCOUNTER — Encounter: Payer: Self-pay | Admitting: Nurse Practitioner

## 2013-09-18 ENCOUNTER — Ambulatory Visit (INDEPENDENT_AMBULATORY_CARE_PROVIDER_SITE_OTHER): Payer: Medicare Other | Admitting: Nurse Practitioner

## 2013-09-18 VITALS — BP 120/76 | HR 60 | Ht 63.0 in | Wt 243.0 lb

## 2013-09-18 DIAGNOSIS — Z01419 Encounter for gynecological examination (general) (routine) without abnormal findings: Secondary | ICD-10-CM

## 2013-09-18 DIAGNOSIS — Z1211 Encounter for screening for malignant neoplasm of colon: Secondary | ICD-10-CM

## 2013-09-18 MED ORDER — OXYBUTYNIN CHLORIDE ER 5 MG PO TB24: 5.0000 mg | ORAL_TABLET | Freq: Every day | ORAL | Status: DC

## 2013-09-18 NOTE — Progress Notes (Signed)
Patient ID: Ebony Ashley, female   DOB: 1945/01/10, 68 y.o.   MRN: [id number] 68 y.o. G5P2002 Married Caucasian Fe here for annual exam.  No new health problems.  Patient's last menstrual period was 01/05/1988.          Sexually active: no  The current method of family planning is status post hysterectomy.  Exercising: no The patient does not participate in regular exercise at present.  Smoker: no   Health Maintenance:  Pap: 09/18/01, WNL  MMG: 10/10/12, BI-Rads 1: negative  Colonoscopy: 09/2008, sigmoid diverticulitis, repeat 10 years, needs IFOB EDG: 02/2008 gastric polyps benign  BMD: 01/2010 T Score: spine 1.1; hip 1.4  TDaP: 09/2005  Shingles: 10/2012 Pneumonia: 10/27/12 TB: 09/2005 Labs:  PCP   reports that she has never smoked. She has never used smokeless tobacco. She reports that she drinks alcohol. She reports that she does not use illicit drugs.  Past Medical History  Diagnosis Date  . Diabetes mellitus   . Esophageal spasm   . Esophagitis   . Hypertension   . Hiatal hernia     Past Surgical History  Procedure Laterality Date  . Tonsillectomy and adenoidectomy  1951  . Abdominal hysterectomy  1990    DUB, ovaries remain  Dr. Laurena Bering    Current Outpatient Prescriptions  Medication Sig Dispense Refill  . amLODipine (NORVASC) 10 MG tablet Take 1 tablet (10 mg total) by mouth daily.  90 tablet  1  . Ascorbic Acid (VITAMIN C) 1000 MG tablet Take 1,000 mg by mouth daily.      Marland Kitchen CALCIUM PO Take 1,500 mg by mouth daily. Three tabs daily      . cholecalciferol (VITAMIN D) 1000 UNITS tablet Take 1,000 Units by mouth daily.        . fish oil-omega-3 fatty acids 1000 MG capsule Take 2 g by mouth daily.        Marland Kitchen FLAXSEED, LINSEED, PO Take by mouth daily.      . metoprolol succinate (TOPROL-XL) 100 MG 24 hr tablet Take 1 tablet (100 mg total) by mouth daily. Take with or immediately following a meal.  90 tablet  1  . nitroGLYCERIN (NITROSTAT) 0.4 MG SL tablet Place 0.4 mg  under the tongue every 5 (five) minutes as needed.        . vitamin E (VITAMIN E) 400 UNIT capsule Take 400 Units by mouth daily.      Marland Kitchen oxybutynin (DITROPAN XL) 5 MG 24 hr tablet Take 1 tablet (5 mg total) by mouth at bedtime.  90 tablet  3   No current facility-administered medications for this visit.    Family History  Problem Relation Age of Onset  . Arthritis Mother   . Atrial fibrillation Mother 20  . Fibromyalgia Mother   . Osteoporosis Mother   . Colon cancer Mother 48  . Hypertension Brother   . Diabetes Brother   . Asthma Brother     ROS:  Pertinent items are noted in HPI.  Otherwise, a comprehensive ROS was negative.  Exam:   BP 120/76  Pulse 60  Ht  (1.6 m)  Wt 243 lb (110.224 kg)  BMI 43.06 kg/m2  LMP 01/05/1988 Height:  (160 cm)  Ht Readings from Last 3 Encounters:  09/18/13  (1.6 m)  09/19/12 5' 3.5" (1.613 m)  09/13/12 5' 3.25" (1.607 m)    General appearance: alert, cooperative and appears stated age Head: Normocephalic, without obvious abnormality, atraumatic Neck: no  adenopathy, supple, symmetrical, trachea midline and thyroid normal to inspection and palpation Lungs: clear to auscultation bilaterally Breasts: normal appearance, no masses or tenderness Heart: regular rate and rhythm Abdomen: soft, non-tender; no masses,  no organomegaly Extremities: extremities normal, atraumatic, no cyanosis or edema Skin: Skin color, texture, turgor normal. No rashes or lesions Lymph nodes: Cervical, supraclavicular, and axillary nodes normal. No abnormal inguinal nodes palpated Neurologic: Grossly normal   Pelvic: External genitalia:  no lesions              Urethra:  normal appearing urethra with no masses, tenderness or lesions              Bartholin's and Skene's: normal                 Vagina: normal appearing vagina with normal color and discharge, no lesions              Cervix: absent              Pap taken: No. Bimanual Exam:  Uterus:   uterus absent              Adnexa: no mass, fullness, tenderness               Rectovaginal: Confirms               Anus:  normal sphincter tone, no lesions  A:  Well Woman with normal exam  S/P TAH secondary to DUB 1990, off ERT 04/2009   OAB - doing well on Vesicare but too expensive  Obesity   Recent family stressors with husbands illness and mother's passing   P:   Reviewed health and wellness pertinent to exam  Pap smear not taken today  Mammogram is due 10/15  Will DC Vesicare secondary to expense.  She had been on Ditropan XL 5 mg at he in the past.  Will try that again.  RX given for a year.  Unsure as to cost and she will let us know if too expensive.   IFOB is given  Counseled on breast self exam, mammography screening, adequate intake of calcium and vitamin D, diet and exercise, Kegel's exercises return annually or prn  An After Visit Summary was printed and given to the patient.

## 2013-09-18 NOTE — Patient Instructions (Addendum)

## 2013-09-25 NOTE — Progress Notes (Signed)
Encounter reviewed by Dr. Brook Silva.  

## 2013-09-27 LAB — FECAL OCCULT BLOOD, IMMUNOCHEMICAL: IFOBT: POSITIVE

## 2013-09-27 NOTE — Addendum Note (Signed)
Addended by: Luisa Dago on: 09/27/2013 10:53 AM   Modules accepted: Orders

## 2013-10-01 ENCOUNTER — Telehealth: Payer: Self-pay | Admitting: Emergency Medicine

## 2013-10-01 NOTE — Telephone Encounter (Signed)
Called patient and message from Lauro Franklin, FNP given.  Patient states "this is not a good time for me. I have appointments for my husband I need to worry about." Last colonoscopy with Dr. Loreta Ave in 2010.   Offered patient assistance with scheduling follow up with Dr. Loreta Ave.  Patient requests appointment only at the end of November, Tuesday/Wednesday/Thursday afternoon only. Patient does not want to be seen any earlier.  Advised patient would call Dr. Kenna Gilbert office with appointment information and will call her back. She is agreeable.

## 2013-10-01 NOTE — Telephone Encounter (Signed)
Patient notified she is scheduled for Tuesday 11/27/13 at 1600 with Dr. Loreta Ave.  Patient is agreeable to this.  She will follow up prn.  Routing to provider for final review. Patient agreeable to disposition. Will close encounter

## 2013-10-01 NOTE — Telephone Encounter (Signed)
Message copied by Joeseph Amor on Mon Oct 01, 2013 10:42 AM ------      Message from: Luisa Dago      Created: Fri Sep 28, 2013 12:27 PM                   ----- Message -----         From: Lauro Franklin, FNP         Sent: 09/28/2013  10:29 AM           To: Luisa Dago, CMA            Please let patient know that IFOB was positive and needs appointment for follow up with GI - do we need to help make that appointment. ------

## 2013-10-03 ENCOUNTER — Other Ambulatory Visit: Payer: Self-pay

## 2013-10-03 DIAGNOSIS — Z1231 Encounter for screening mammogram for malignant neoplasm of breast: Secondary | ICD-10-CM

## 2013-10-11 ENCOUNTER — Other Ambulatory Visit: Payer: Self-pay | Admitting: Internal Medicine

## 2013-10-12 ENCOUNTER — Ambulatory Visit: Payer: Self-pay | Admitting: Internal Medicine

## 2013-10-23 ENCOUNTER — Ambulatory Visit: Payer: Medicare Other

## 2013-11-05 ENCOUNTER — Encounter: Payer: Self-pay | Admitting: Nurse Practitioner

## 2013-11-21 ENCOUNTER — Ambulatory Visit: Payer: Medicare Other

## 2013-12-06 ENCOUNTER — Other Ambulatory Visit: Payer: Self-pay | Admitting: Internal Medicine

## 2013-12-07 ENCOUNTER — Ambulatory Visit: Payer: Self-pay | Admitting: Internal Medicine

## 2013-12-10 ENCOUNTER — Telehealth: Payer: Self-pay

## 2013-12-10 NOTE — Telephone Encounter (Signed)
Left message for patient to call office to let us know if/when they received flu vaccine. 

## 2013-12-11 ENCOUNTER — Ambulatory Visit
Admission: RE | Admit: 2013-12-11 | Discharge: 2013-12-11 | Disposition: A | Discharge status: Home / Self Care | Payer: Medicare Other | Source: Ambulatory Visit

## 2013-12-11 DIAGNOSIS — Z1231 Encounter for screening mammogram for malignant neoplasm of breast: Secondary | ICD-10-CM

## 2014-01-24 ENCOUNTER — Other Ambulatory Visit: Payer: Self-pay | Admitting: Internal Medicine

## 2014-01-25 ENCOUNTER — Ambulatory Visit: Payer: Self-pay | Admitting: Internal Medicine

## 2014-03-07 ENCOUNTER — Other Ambulatory Visit: Payer: Self-pay | Admitting: Internal Medicine

## 2014-03-08 ENCOUNTER — Ambulatory Visit: Payer: Self-pay | Admitting: Internal Medicine

## 2014-04-08 ENCOUNTER — Other Ambulatory Visit: Payer: Self-pay | Admitting: *Deleted

## 2014-04-08 MED ORDER — AMLODIPINE BESYLATE 10 MG PO TABS: 10.0000 mg | ORAL_TABLET | Freq: Every day | ORAL | Status: DC

## 2014-04-08 NOTE — Telephone Encounter (Signed)
Amlodipine refilled.

## 2014-04-24 ENCOUNTER — Other Ambulatory Visit: Payer: Self-pay | Admitting: Internal Medicine

## 2014-04-30 ENCOUNTER — Other Ambulatory Visit: Payer: Self-pay | Admitting: Internal Medicine

## 2014-05-02 ENCOUNTER — Ambulatory Visit: Payer: Self-pay | Admitting: Internal Medicine

## 2014-06-04 ENCOUNTER — Other Ambulatory Visit: Payer: Self-pay | Admitting: Internal Medicine

## 2014-07-02 ENCOUNTER — Other Ambulatory Visit: Payer: Self-pay | Admitting: Internal Medicine

## 2014-07-04 ENCOUNTER — Ambulatory Visit: Payer: Self-pay | Admitting: Internal Medicine

## 2014-07-05 ENCOUNTER — Other Ambulatory Visit: Payer: Self-pay | Admitting: Internal Medicine

## 2014-08-27 ENCOUNTER — Other Ambulatory Visit: Payer: Self-pay | Admitting: Internal Medicine

## 2014-08-28 ENCOUNTER — Other Ambulatory Visit: Payer: Self-pay | Admitting: Internal Medicine

## 2014-08-28 ENCOUNTER — Other Ambulatory Visit: Payer: Self-pay | Admitting: *Deleted

## 2014-08-28 MED ORDER — OXYBUTYNIN CHLORIDE ER 5 MG PO TB24: 5.0000 mg | ORAL_TABLET | Freq: Every day | ORAL | Status: DC

## 2014-08-28 NOTE — Telephone Encounter (Signed)
Faxed Medication refill request from [facility]: Oxybutynin Chloride ER Tablet  Last AEX:  09/18/13 with PG  Next AEX: 09/24/14 with PG  Last MMG (if hormonal medication request): N/A Refill authorized: #[id number]

## 2014-08-29 ENCOUNTER — Ambulatory Visit: Payer: Self-pay | Admitting: Internal Medicine

## 2014-09-24 ENCOUNTER — Encounter: Payer: Self-pay | Admitting: *Deleted

## 2014-09-24 ENCOUNTER — Ambulatory Visit (INDEPENDENT_AMBULATORY_CARE_PROVIDER_SITE_OTHER): Payer: Medicare Other | Admitting: Nurse Practitioner

## 2014-09-24 VITALS — BP 120/78 | HR 60 | Ht 63.0 in | Wt 251.0 lb

## 2014-09-24 DIAGNOSIS — F439 Reaction to severe stress, unspecified: Secondary | ICD-10-CM

## 2014-09-24 DIAGNOSIS — Z658 Other specified problems related to psychosocial circumstances: Secondary | ICD-10-CM

## 2014-09-24 DIAGNOSIS — Z01419 Encounter for gynecological examination (general) (routine) without abnormal findings: Secondary | ICD-10-CM | POA: Diagnosis not present

## 2014-09-24 DIAGNOSIS — N3281 Overactive bladder: Secondary | ICD-10-CM

## 2014-09-24 DIAGNOSIS — E8881 Metabolic syndrome: Secondary | ICD-10-CM

## 2014-09-24 DIAGNOSIS — I10 Essential (primary) hypertension: Secondary | ICD-10-CM

## 2014-09-24 MED ORDER — OXYBUTYNIN CHLORIDE ER 5 MG PO TB24: 5.0000 mg | ORAL_TABLET | Freq: Every day | ORAL | Status: DC

## 2014-09-24 NOTE — Patient Instructions (Addendum)

## 2014-09-24 NOTE — Progress Notes (Signed)
Patient ID: Ebony Ashley, female   DOB: 06/21/1945, 69 y.o.   MRN: [id number] 69 y.o. G2P2002 Married  Caucasian Fe here for annual exam.  No new health problems.  Husband had a stroke last October - still with some limitations.  They are handling a change in roles.  Also 4 friends passed this year.  Patient's last menstrual period was 01/05/1988 (approximate).          Sexually active: No.  The current method of family planning is none.    Exercising: No.  The patient does not participate in regular exercise at present. Smoker:  no  Health Maintenance: Pap: 09/18/01, WNL  MMG: 12/11/13, 3D, BI-Rads 1: Negative  Colonoscopy: 09/2008, sigmoid diverticulitis, repeat 10 years EDG: 02/2008 gastric polyps benign  BMD: 01/2010 T Score: spine 1.1; hip 1.4  TDaP: 09/2005  Shingles: 10/2012 Pneumonia: 10/27/12 TB: 09/2005 Labs: PCP   reports that she has never smoked. She has never used smokeless tobacco. She reports that she drinks alcohol. She reports that she does not use illicit drugs.  Past Medical History  Diagnosis Date  . Diabetes mellitus   . Esophageal spasm   . Esophagitis   . Hypertension   . Hiatal hernia     Past Surgical History  Procedure Laterality Date  . Tonsillectomy and adenoidectomy  1951  . Abdominal hysterectomy  1990    DUB, ovaries remain  Dr. Laurena Bering, off ERT 4/11    Current Outpatient Prescriptions  Medication Sig Dispense Refill  . amLODipine (NORVASC) 10 MG tablet TAKE 1 BY MOUTH DAILY 90 tablet 0  . Ascorbic Acid (VITAMIN C) 1000 MG tablet Take 1,000 mg by mouth daily.    Marland Kitchen CALCIUM PO Take 1,500 mg by mouth daily. Three tabs daily    . cholecalciferol (VITAMIN D) 1000 UNITS tablet Take 1,000 Units by mouth daily.      . fish oil-omega-3 fatty acids 1000 MG capsule Take 2 g by mouth daily.      Marland Kitchen FLAXSEED, LINSEED, PO Take by mouth daily.    . metoprolol succinate (TOPROL-XL) 100 MG 24 hr tablet TAKE 1 BY MOUTH DAILY WITH OR IMMEDIATELY FOLLOWING A  MEAL 90 tablet 0  . nitroGLYCERIN (NITROSTAT) 0.4 MG SL tablet Place 0.4 mg under the tongue every 5 (five) minutes as needed.      Marland Kitchen oxybutynin (DITROPAN XL) 5 MG 24 hr tablet Take 1 tablet (5 mg total) by mouth at bedtime. 90 tablet 4  . vitamin E (VITAMIN E) 400 UNIT capsule Take 400 Units by mouth daily.     No current facility-administered medications for this visit.    Family History  Problem Relation Age of Onset  . Arthritis Mother   . Atrial fibrillation Mother 69  . Fibromyalgia Mother   . Osteoporosis Mother   . Colon cancer Mother 34  . Hypertension Brother   . Diabetes Brother   . Asthma Brother     ROS:  Pertinent items are noted in HPI.  Otherwise, a comprehensive ROS was negative.  Exam:   BP 120/78 mmHg  Pulse 60  Ht  (1.6 m)  Wt 251 lb (113.853 kg)  BMI 44.47 kg/m2  LMP 01/05/1988 (Approximate) Height:  (160 cm) Ht Readings from Last 3 Encounters:  09/24/14  (1.6 m)  09/18/13  (1.6 m)  09/19/12 5' 3.5" (1.613 m)    General appearance: alert, cooperative and appears stated age Head: Normocephalic, without obvious abnormality, atraumatic Neck:  no adenopathy, supple, symmetrical, trachea midline and thyroid normal to inspection and palpation Lungs: clear to auscultation bilaterally Breasts: normal appearance, no masses or tenderness Heart: regular rate and rhythm Abdomen: soft, non-tender; no masses,  no organomegaly Extremities: extremities normal, atraumatic, no cyanosis or edema Skin: Skin color, texture, turgor normal. No rashes or lesions Lymph nodes: Cervical, supraclavicular, and axillary nodes normal. No abnormal inguinal nodes palpated Neurologic: Grossly normal   Pelvic: External genitalia:  no lesions              Urethra:  normal appearing urethra with no masses, tenderness or lesions              Bartholin's and Skene's: normal                 Vagina: normal appearing vagina with normal color and discharge, no  lesions              Cervix: absent              Pap taken: No. Bimanual Exam:  Uterus:  uterus absent              Adnexa: no mass, fullness, tenderness               Rectovaginal: Confirms               Anus:  normal sphincter tone, no lesions  Chaperone present: no  A:  Well Woman with normal exam  S/P TAH secondary to DUB 1990, off ERT 04/2009  OAB - doing well on Ditropan used only prn Obesity, DM, HTN, Metabolic syndrome  Recent family stressors with husbands illness and mother's passing   P:   Reviewed health and wellness pertinent to exam  Pap smear as above  Mammogram is due 12/16  Refill on Ditropan for a year  Counseled on breast self exam, mammography screening, adequate intake of calcium and vitamin D, diet and exercise, Kegel's exercises return annually or prn  An After Visit Summary was printed and given to the patient.

## 2014-09-29 NOTE — Progress Notes (Signed)
Encounter reviewed by Dr. Brook Amundson C. Silva.  

## 2014-10-03 ENCOUNTER — Telehealth: Payer: Self-pay | Admitting: Nurse Practitioner

## 2014-10-03 NOTE — Telephone Encounter (Signed)
Patient states that Ria Comment, FNP recommended she has the Pneumococcal Vaccine, but she will need a prescription for this. Routing to Leota Sauers CNM for paper order to fax to pharmacy.

## 2014-10-03 NOTE — Telephone Encounter (Signed)
Patient says Ebony Ashley want her to get a pneumonia shot but she need a prescription for it.

## 2014-10-04 ENCOUNTER — Other Ambulatory Visit: Payer: Self-pay | Admitting: Certified Nurse Midwife

## 2014-10-04 NOTE — Telephone Encounter (Signed)
Per epic it looks like she had it with Candis Shine PCP

## 2014-10-04 NOTE — Telephone Encounter (Signed)
Per immunization record patient had Pneumococcal 23 in 2011 and 2014. No record of PCV13. Could possibly need PCV13?  Left message for patient to call Desirey Keahey at [phone].

## 2014-10-08 NOTE — Telephone Encounter (Signed)
Spoke with patient. Advised of message as seen below from Ria Comment, FNP. Patient states the she [facility] pneumonia [facility] along with her flu shot. Advised I will let Ria Comment, FNP know. Patient is agreeable.  Routing to provider for final review. Patient agreeable to disposition. Will close encounter.

## 2014-10-08 NOTE — Telephone Encounter (Signed)
Yes this is correct.

## 2014-10-08 NOTE — Telephone Encounter (Signed)
She may be able to get this at PCP or at [facility] (they advertise that you can get it there).  I do not know the cost but they can tell her.  Do not need an RX as far as I can tell.

## 2014-10-08 NOTE — Telephone Encounter (Signed)
Is it PCV13 that is recommended for the patient to have at this time?

## 2014-10-08 NOTE — Telephone Encounter (Signed)
Routing to Patricia Grubb, FNP for review. 

## 2014-11-05 ENCOUNTER — Ambulatory Visit (INDEPENDENT_AMBULATORY_CARE_PROVIDER_SITE_OTHER): Payer: Medicare Other | Admitting: Internal Medicine

## 2014-11-05 VITALS — BP 112/70 | HR 53 | Temp 97.8°F | Resp 18 | Ht 63.0 in | Wt 250.0 lb

## 2014-11-05 DIAGNOSIS — K219 Gastro-esophageal reflux disease without esophagitis: Secondary | ICD-10-CM

## 2014-11-05 DIAGNOSIS — E669 Obesity, unspecified: Secondary | ICD-10-CM | POA: Diagnosis not present

## 2014-11-05 DIAGNOSIS — Z0000 Encounter for general adult medical examination without abnormal findings: Secondary | ICD-10-CM | POA: Diagnosis not present

## 2014-11-05 DIAGNOSIS — E119 Type 2 diabetes mellitus without complications: Secondary | ICD-10-CM

## 2014-11-05 LAB — HEPATIC FUNCTION PANEL
ALT: 15 U/L (ref 6–29)
AST: 16 U/L (ref 10–35)
Albumin: 4.2 g/dL (ref 3.6–5.1)
Alkaline Phosphatase: 121 U/L (ref 33–130)
Bilirubin, Direct: 0.1 mg/dL (ref <=0.2)
Indirect Bilirubin: 0.4 mg/dL (ref 0.2–1.2)
Total Bilirubin: 0.5 mg/dL (ref 0.2–1.2)
Total Protein: 6.4 g/dL (ref 6.1–8.1)

## 2014-11-05 LAB — LIPID PANEL
Cholesterol: 168 mg/dL (ref 125–200)
HDL: 40 mg/dL — ABNORMAL LOW (ref >=46)
LDL Cholesterol: 103 mg/dL (ref <130)
Total CHOL/HDL Ratio: 4.2 Ratio (ref <=5.0)
Triglycerides: 124 mg/dL (ref <150)
VLDL: 25 mg/dL (ref <30)

## 2014-11-05 LAB — HEMOGLOBIN A1C
Hgb A1c MFr Bld: 6.3 % — ABNORMAL HIGH (ref <5.7)
Mean Plasma Glucose: 134 mg/dL — ABNORMAL HIGH (ref <117)

## 2014-11-05 NOTE — Progress Notes (Signed)
   Subjective:    Patient ID: Ebony Ashley, female    DOB: 07-Dec-1945, 69 y.o.   MRN: [id number]  HPI 69 year old Female not seen since 2014. She has been seeing GYN nurse practitioner, Rock NephewPatti Grubb regularly. Patient retired from hospital a couple of years ago. Last year, her husband had a stroke. He has some issues ambulating. She would make appointments here and then cancel  them after we refilled her medications. I had a conversation with her about this last night. I convinced her to come in today to be seen.  Patient says she was frustrated that she had not been able to lose weight, diet and exercise. She was afraid that I would be angry with her. Told her I was not angry with her and I wanted her to take care of herself.  Weight is GYN office in September 2015 was 243 pounds and weight is now 250 pain  Review of Systems     Objective:   Physical Exam  Skin is warm and dry. Nodes none. Neck is supple without JVD thyromegaly or carotid bruits. Chest clear to auscultation. Cardiac exam regular rate and rhythm normal S1 and S2. Extremities without pitting edema. Lipid panel is within normal limits on statin medication. Liver panel normal. Hemoglobin A1c is 6.3% and previously was 7.1% in 2014     Assessment & Plan:  Obesity-advise diet and exercise  GE reflux-treated with PPI  Controlled type 2 diabetes mellitus-diet controlled  Metabolic syndrome  Essential hypertension-2 drug regimen to control  Plan: Refill amlodipine and Toprol. Return April 2017 at which time she is scheduled for CPE.

## 2014-12-03 ENCOUNTER — Encounter: Payer: Self-pay | Admitting: Internal Medicine

## 2014-12-03 NOTE — Patient Instructions (Signed)
Continue same medications and return for physical exam in April 2017. It was a pleasure to see you today.

## 2015-01-23 ENCOUNTER — Telehealth: Payer: Self-pay | Admitting: Internal Medicine

## 2015-01-23 MED ORDER — AMLODIPINE BESYLATE 10 MG PO TABS: 10.0000 mg | ORAL_TABLET | Freq: Every day | ORAL | Status: DC

## 2015-01-23 NOTE — Telephone Encounter (Signed)
Needs a refill on Amlodipine  daily.  Refill for 6 months per Dr. Beryle Quant verbal order.    She has changed insurance to [facility] and therefore she has to change pharmacy's.    [facility] [place] [phone]

## 2015-02-04 DIAGNOSIS — R7303 Prediabetes: Secondary | ICD-10-CM | POA: Diagnosis not present

## 2015-02-04 DIAGNOSIS — Z23 Encounter for immunization: Secondary | ICD-10-CM | POA: Diagnosis not present

## 2015-02-04 DIAGNOSIS — Z8639 Personal history of other endocrine, nutritional and metabolic disease: Secondary | ICD-10-CM | POA: Diagnosis not present

## 2015-02-04 DIAGNOSIS — Z13818 Encounter for screening for other digestive system disorders: Secondary | ICD-10-CM | POA: Diagnosis not present

## 2015-02-04 DIAGNOSIS — E119 Type 2 diabetes mellitus without complications: Secondary | ICD-10-CM | POA: Diagnosis not present

## 2015-02-04 DIAGNOSIS — E669 Obesity, unspecified: Secondary | ICD-10-CM | POA: Diagnosis not present

## 2015-02-04 DIAGNOSIS — I10 Essential (primary) hypertension: Secondary | ICD-10-CM | POA: Diagnosis not present

## 2015-02-10 ENCOUNTER — Other Ambulatory Visit: Payer: Self-pay

## 2015-02-10 DIAGNOSIS — Z1231 Encounter for screening mammogram for malignant neoplasm of breast: Secondary | ICD-10-CM

## 2015-02-27 ENCOUNTER — Ambulatory Visit
Admission: RE | Admit: 2015-02-27 | Discharge: 2015-02-27 | Disposition: A | Discharge status: Home / Self Care | Payer: Medicare Other | Source: Ambulatory Visit

## 2015-02-27 DIAGNOSIS — Z1231 Encounter for screening mammogram for malignant neoplasm of breast: Secondary | ICD-10-CM | POA: Diagnosis not present

## 2015-04-08 ENCOUNTER — Ambulatory Visit (INDEPENDENT_AMBULATORY_CARE_PROVIDER_SITE_OTHER): Payer: Medicare Other | Admitting: Internal Medicine

## 2015-04-08 ENCOUNTER — Encounter: Payer: Self-pay | Admitting: Internal Medicine

## 2015-04-08 VITALS — BP 128/84 | HR 58 | Temp 97.8°F | Resp 18 | Ht 63.0 in | Wt 256.0 lb

## 2015-04-08 DIAGNOSIS — Z1329 Encounter for screening for other suspected endocrine disorder: Secondary | ICD-10-CM | POA: Diagnosis not present

## 2015-04-08 DIAGNOSIS — Z79899 Other long term (current) drug therapy: Secondary | ICD-10-CM

## 2015-04-08 DIAGNOSIS — K2100 Gastro-esophageal reflux disease with esophagitis, without bleeding: Secondary | ICD-10-CM

## 2015-04-08 DIAGNOSIS — E119 Type 2 diabetes mellitus without complications: Secondary | ICD-10-CM | POA: Diagnosis not present

## 2015-04-08 DIAGNOSIS — Z0000 Encounter for general adult medical examination without abnormal findings: Secondary | ICD-10-CM | POA: Diagnosis not present

## 2015-04-08 DIAGNOSIS — E669 Obesity, unspecified: Secondary | ICD-10-CM

## 2015-04-08 DIAGNOSIS — I10 Essential (primary) hypertension: Secondary | ICD-10-CM

## 2015-04-08 DIAGNOSIS — E559 Vitamin D deficiency, unspecified: Secondary | ICD-10-CM

## 2015-04-08 DIAGNOSIS — Z13220 Encounter for screening for lipoid disorders: Secondary | ICD-10-CM | POA: Diagnosis not present

## 2015-04-08 LAB — LIPID PANEL
Cholesterol: 162 mg/dL (ref 125–200)
HDL: 40 mg/dL — ABNORMAL LOW (ref >=46)
LDL Cholesterol: 99 mg/dL (ref <130)
Total CHOL/HDL Ratio: 4.1 Ratio (ref <=5.0)
Triglycerides: 113 mg/dL (ref <150)
VLDL: 23 mg/dL (ref <30)

## 2015-04-08 LAB — CBC WITH DIFFERENTIAL/PLATELET
Basophils Absolute: 64 cells/uL (ref 0–200)
Basophils Relative: 1 %
Eosinophils Absolute: 128 cells/uL (ref 15–500)
Eosinophils Relative: 2 %
HCT: 46.8 % — ABNORMAL HIGH (ref 35.0–45.0)
Hemoglobin: 15.3 g/dL (ref 11.7–15.5)
Lymphocytes Relative: 25 %
Lymphs Abs: 1600 cells/uL (ref 850–3900)
MCH: 29.7 pg (ref 27.0–33.0)
MCHC: 32.7 g/dL (ref 32.0–36.0)
MCV: 90.9 fL (ref 80.0–100.0)
MPV: 12.1 fL (ref 7.5–12.5)
Monocytes Absolute: 448 cells/uL (ref 200–950)
Monocytes Relative: 7 %
Neutro Abs: 4160 cells/uL (ref 1500–7800)
Neutrophils Relative %: 65 %
Platelets: 279 10*3/uL (ref 140–400)
RBC: 5.15 MIL/uL — ABNORMAL HIGH (ref 3.80–5.10)
RDW: 14.3 % (ref 11.0–15.0)
WBC: 6.4 10*3/uL (ref 3.8–10.8)

## 2015-04-08 LAB — COMPLETE METABOLIC PANEL WITH GFR
ALT: 15 U/L (ref 6–29)
AST: 15 U/L (ref 10–35)
Albumin: 4.2 g/dL (ref 3.6–5.1)
Alkaline Phosphatase: 130 U/L (ref 33–130)
BUN: 12 mg/dL (ref 7–25)
CO2: 29 mmol/L (ref 20–31)
Calcium: 9.2 mg/dL (ref 8.6–10.4)
Chloride: 103 mmol/L (ref 98–110)
Creat: 0.54 mg/dL — ABNORMAL LOW (ref 0.60–0.93)
GFR, Est African American: >89 mL/min (ref >=60)
GFR, Est Non African American: >89 mL/min (ref >=60)
Glucose, Bld: 128 mg/dL — ABNORMAL HIGH (ref 65–99)
Potassium: 4.8 mmol/L (ref 3.5–5.3)
Sodium: 141 mmol/L (ref 135–146)
Total Bilirubin: 0.3 mg/dL (ref 0.2–1.2)
Total Protein: 6.2 g/dL (ref 6.1–8.1)

## 2015-04-08 LAB — TSH: TSH: 1.02 mIU/L

## 2015-04-08 NOTE — Progress Notes (Signed)
Subjective:    Patient ID: Ebony RosalesClaudia S Ashley, female    DOB: 1945-11-14, 70 y.o.   MRN: [id number]  HPI 70 year old White Female in today for health maintenance exam and evaluation of medical issues. She has a history of type 2 diabetes mellitus, hypertension, esophageal spasm and esophagitis. History of overactive bladder. History of obesity.  Past medical history: Fractured left lower leg 1998. Total abdominal hysterectomy without oophorectomy for menorrhagia in 1991. Patient has had 2 D&C procedures in the past prior to her hysterectomy. Colonoscopy in 2009 by Dr. Loreta AveMann.  Tetanus immunization 2007. Pneumovax immunization August 2011. Prevnar 09/17/2014. Flu vaccine September 2016. T dab vaccine June 2010.  GYN care is done through Rock NephewPatti Grubb, [facility].  Social history: Patient does not smoke. Social alcohol consumption consisting of drinking a wine cooler on vacation. She graduated from [employer]. Formerly worked at [employer]. 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 She is married. One adult daughter and one adult son.  Patient says Polysporin causes a rash and adhesive cause rash and irritation.  Patient is had a 2-D echocardiogram in 2009 showing mild left ventricular diastolic dysfunction.This was done to evaluate murmur that both Dr. Antoine PocheHochrein and I heard. He thought it was probably some septal hypertrophy. 2-D echocardiogram did not show valvular stenosis.  Had bone density study in 2010 with normal T scores.  Family history: Mother died with colon cancer days 95. Father died at age 71 in automobile accident. Stepbrother died at age 71 of a stroke. No sisters.      Review of Systems  Constitutional: Negative.   HENT: Negative.   Respiratory: Negative.   Cardiovascular: Negative for chest pain and palpitations.  Neurological: Negative.     Psychiatric/Behavioral: Negative.        Objective:   Physical Exam  Constitutional: She is oriented to person, place, and time. She appears well-developed and well-nourished. No distress.  HENT:  Head: Normocephalic and atraumatic.  Right Ear: External ear normal.  Left Ear: External ear normal.  Mouth/Throat: Oropharynx is clear and moist. No oropharyngeal exudate.  Eyes: Conjunctivae and EOM are normal. Pupils are equal, round, and reactive to light. Right eye exhibits no discharge. Left eye exhibits no discharge. No scleral icterus.  Neck: Neck supple. No JVD present. No thyromegaly present.  Cardiovascular: Normal rate.   Murmur heard. 1 to 2/6 systolic ejection murmur heard along the upper left sternal border and radiates into both carotids  Pulmonary/Chest: Effort normal and breath sounds normal. No respiratory distress. She has no wheezes. She has no rales.  Abdominal: Soft. Bowel sounds are normal. She exhibits no distension and no mass. There is no tenderness. There is no rebound and no guarding.  Genitourinary:  Deferred to GYN. Still goes annually.  Musculoskeletal: She exhibits no edema.  Lymphadenopathy:    She has no cervical adenopathy.  Neurological: She is alert and oriented to person, place, and time. She has normal reflexes.  Skin: Skin is warm and dry. No rash noted. She is not diaphoretic.  Onychomycosis  Psychiatric: She has a normal mood and affect. Her behavior is normal. Judgment and thought content normal.  Vitals reviewed.         Assessment & Plan:  Essential hypertension-stable on current medication  Controlled Type 2 diabetes mellitus-hemoglobin A1c acceptable  History of GE reflux and esophagitis  Obesity  Plan: Return in  one year or as needed. Recommend annual flu vaccine. Watch diet and exercise. Continue annual mammogram and annual exam.  Subjective:   Patient presents for Medicare Annual/Subsequent preventive examination.  Review  Past Medical/Family/Social:See above   Risk Factors  Current exercise habits: Not a lot of exercise Dietary issues discussed: Low fat low carbohydrate  Cardiac risk factors:Obesity, diabetes  Depression Screen  (Note: if answer to either of the following is "Yes", a more complete depression screening is indicated)   Over the past two weeks, have you felt down, depressed or hopeless? No  Over the past two weeks, have you felt little interest or pleasure in doing things? No Have you lost interest or pleasure in daily life? No Do you often feel hopeless? No Do you cry easily over simple problems? No   Activities of Daily Living  In your present state of health, do you have any difficulty performing the following activities?:   Driving? No  Managing money? No  Feeding yourself? No  Getting from bed to chair? No  Climbing a flight of stairs? No  Preparing food and eating?: No  Bathing or showering? No  Getting dressed: No  Getting to the toilet? No  Using the toilet:No  Moving around from place to place: No  In the past year have you fallen or had a near fall?:No  Are you sexually active? No  Do you have more than one partner? No   Hearing Difficulties: No  Do you often ask people to speak up or repeat themselves? No  Do you experience ringing or noises in your ears? No  Do you have difficulty understanding soft or whispered voices? No  Do you feel that you have a problem with memory? No Do you often misplace items? No    Home Safety:  Do you have a smoke alarm at your residence? Yes Do you have grab bars in the bathroom? Do you have throw rugs in your house?   Cognitive Testing  Alert? Yes Normal Appearance?Yes  Oriented to person? Yes Place? Yes  Time? Yes  Recall of three objects? Yes  Can perform simple calculations? Yes  Displays appropriate judgment?Yes  Can read the correct time from a watch face?Yes   List the Names of Other Physician/Practitioners you  currently use:  See referral list for the physicians patient is currently seeing.     Review of Systems: See above   Objective:     General appearance: Appears stated age and mildly obese  Head: Normocephalic, without obvious abnormality, atraumatic  Eyes: conj clear, EOMi PEERLA  Ears: normal TM's and external ear canals both ears  Nose: Nares normal. Septum midline. Mucosa normal. No drainage or sinus tenderness.  Throat: lips, mucosa, and tongue normal; teeth and gums normal  Neck: no adenopathy, no carotid bruit, no JVD, supple, symmetrical, trachea midline and thyroid not enlarged, symmetric, no tenderness/mass/nodules  No CVA tenderness.  Lungs: clear to auscultation bilaterally  Breasts: normal appearance, no masses or tenderness Heart: regular rate and rhythm, S1, S2 normal, no murmur, click, rub or gallop  Abdomen: soft, non-tender; bowel sounds normal; no masses, no organomegaly  Musculoskeletal: ROM normal in all joints, no crepitus, no deformity, Normal muscle strengthen. Back  is symmetric, no curvature. Skin: Skin color, texture, turgor normal. No rashes or lesions  Lymph nodes: Cervical, supraclavicular, and axillary nodes normal.  Neurologic: CN 2 -12 Normal, Normal symmetric reflexes. Normal coordination and gait  Psych: Alert & Oriented x 3, Mood appear  stable.    Assessment:    Annual wellness medicare exam   Plan:    During the course of the visit the patient was educated and counseled about appropriate screening and preventive services including:   Annual mammogram  Annual flu vaccine     Patient Instructions (the written plan) was given to the patient.  Medicare Attestation  I have personally reviewed:  The patient's medical and social history  Their use of alcohol, tobacco or illicit drugs  Their current medications and supplements  The patient's functional ability including ADLs,fall risks, home safety risks, cognitive, and hearing and visual  impairment  Diet and physical activities  Evidence for depression or mood disorders  The patient's weight, height, BMI, and visual acuity have been recorded in the chart. I have made referrals, counseling, and provided education to the patient based on review of the above and I have provided the patient with a written personalized care plan for preventive services.

## 2015-04-09 LAB — HEMOGLOBIN A1C
Hgb A1c MFr Bld: 7 % — ABNORMAL HIGH (ref <5.7)
Mean Plasma Glucose: 154 mg/dL

## 2015-04-09 LAB — VITAMIN D 25 HYDROXY (VIT D DEFICIENCY, FRACTURES): Vit D, 25-Hydroxy: 49 ng/mL (ref 30–100)

## 2015-04-09 LAB — MICROALBUMIN, URINE: Microalb, Ur: 1.2 mg/dL

## 2015-04-26 NOTE — Patient Instructions (Addendum)
It was a pleasure to see you today. Continue same medications and return in one year. 

## 2015-05-08 MED FILL — OMEPRAZOLE DR 40 MG CAPSULE: 40 | 90 days supply | Qty: 90 | Fill #0

## 2015-05-13 DIAGNOSIS — E119 Type 2 diabetes mellitus without complications: Secondary | ICD-10-CM | POA: Diagnosis not present

## 2015-05-13 DIAGNOSIS — K219 Gastro-esophageal reflux disease without esophagitis: Secondary | ICD-10-CM | POA: Diagnosis not present

## 2015-05-13 DIAGNOSIS — M25562 Pain in left knee: Secondary | ICD-10-CM | POA: Diagnosis not present

## 2015-05-13 DIAGNOSIS — I10 Essential (primary) hypertension: Secondary | ICD-10-CM | POA: Diagnosis not present

## 2015-06-10 DIAGNOSIS — H04123 Dry eye syndrome of bilateral lacrimal glands: Secondary | ICD-10-CM | POA: Diagnosis not present

## 2015-06-10 DIAGNOSIS — H5213 Myopia, bilateral: Secondary | ICD-10-CM | POA: Diagnosis not present

## 2015-06-10 DIAGNOSIS — H25813 Combined forms of age-related cataract, bilateral: Secondary | ICD-10-CM | POA: Diagnosis not present

## 2015-06-10 DIAGNOSIS — H43813 Vitreous degeneration, bilateral: Secondary | ICD-10-CM | POA: Diagnosis not present

## 2015-06-10 DIAGNOSIS — E119 Type 2 diabetes mellitus without complications: Secondary | ICD-10-CM | POA: Diagnosis not present

## 2015-06-10 LAB — HM DIABETES EYE EXAM

## 2015-08-14 DIAGNOSIS — E119 Type 2 diabetes mellitus without complications: Secondary | ICD-10-CM | POA: Diagnosis not present

## 2015-08-14 DIAGNOSIS — I10 Essential (primary) hypertension: Secondary | ICD-10-CM | POA: Diagnosis not present

## 2015-08-14 DIAGNOSIS — H8110 Benign paroxysmal vertigo, unspecified ear: Secondary | ICD-10-CM | POA: Diagnosis not present

## 2015-08-20 DIAGNOSIS — R748 Abnormal levels of other serum enzymes: Secondary | ICD-10-CM | POA: Diagnosis not present

## 2015-08-20 DIAGNOSIS — E119 Type 2 diabetes mellitus without complications: Secondary | ICD-10-CM | POA: Diagnosis not present

## 2015-08-20 DIAGNOSIS — I10 Essential (primary) hypertension: Secondary | ICD-10-CM | POA: Diagnosis not present

## 2015-09-30 ENCOUNTER — Ambulatory Visit (INDEPENDENT_AMBULATORY_CARE_PROVIDER_SITE_OTHER): Payer: Medicare Other | Admitting: Nurse Practitioner

## 2015-09-30 ENCOUNTER — Encounter: Payer: Self-pay | Admitting: Nurse Practitioner

## 2015-09-30 VITALS — BP 124/78 | HR 64 | Ht 62.75 in | Wt 261.0 lb

## 2015-09-30 DIAGNOSIS — Z01419 Encounter for gynecological examination (general) (routine) without abnormal findings: Secondary | ICD-10-CM | POA: Diagnosis not present

## 2015-09-30 DIAGNOSIS — N3281 Overactive bladder: Secondary | ICD-10-CM

## 2015-09-30 DIAGNOSIS — I10 Essential (primary) hypertension: Secondary | ICD-10-CM

## 2015-09-30 DIAGNOSIS — E8881 Metabolic syndrome: Secondary | ICD-10-CM

## 2015-09-30 DIAGNOSIS — Z1211 Encounter for screening for malignant neoplasm of colon: Secondary | ICD-10-CM

## 2015-09-30 MED ORDER — OXYBUTYNIN CHLORIDE ER 5 MG PO TB24: 5.0000 mg | ORAL_TABLET | Freq: Every day | ORAL | 4 refills | Status: DC

## 2015-09-30 NOTE — Patient Instructions (Signed)

## 2015-09-30 NOTE — Progress Notes (Signed)
Patient ID: Ebony Ashley, female   DOB: Oct 21, 1945, 70 y.o.   MRN: [id number]  70 y.o. 822P2002 Married  Caucasian Fe here for annual exam.  Last HGB AIC was 6.1 and now started on Metformin a month ago.  Patient's last menstrual period was 01/05/1988 (approximate).          Sexually active: No.  The current method of family planning is none.    Exercising: No.  The patient does not participate in regular exercise at present. Smoker:  no  Health Maintenance: Pap: 09/18/01, Negative (hysterectomy) MMG: 02/27/15, 3D, BI-Rads 1: Negative  Colonoscopy: 09/2008, sigmoid diverticulitis, repeat 10 years, needs IFOB EDG: 02/2008 gastric polyps benign  BMD: 01/16/08 T Score: 1.1 Spine / 1.4 Left Femur Neck TDaP: 02/04/15 Shingles: 10/04/12 Pneumonia: 10/03/14 Prevnar 13, 10/27/12, 08/04/09 Hep C: 01/2015 Dr. Shirlee LatchMcLean Labs: in EPIC   reports that she has never smoked. She has never used smokeless tobacco. She reports that she drinks alcohol. She reports that she does not use drugs.  Past Medical History:  Diagnosis Date  . Diabetes mellitus   . Esophageal spasm   . Esophagitis   . Hiatal hernia   . Hypertension     Past Surgical History:  Procedure Laterality Date  . ABDOMINAL HYSTERECTOMY  1990   DUB, ovaries remain  Dr. Laurena BeringSchaal, off ERT 4/11  . TONSILLECTOMY AND ADENOIDECTOMY  1951    Current Outpatient Prescriptions  Medication Sig Dispense Refill  . amLODipine (NORVASC) 10 MG tablet Take 1 tablet (10 mg total) by mouth daily. 90 tablet 1  . Ascorbic Acid (VITAMIN C) 1000 MG tablet Take 1,000 mg by mouth daily.    Ebony Ashley. CALCIUM PO Take 1,500 mg by mouth daily. Three tabs daily    . cholecalciferol (VITAMIN D) 1000 UNITS tablet Take 1,000 Units by mouth daily.      . metoprolol succinate (TOPROL-XL) 100 MG 24 hr tablet TAKE 1 BY MOUTH DAILY WITH OR IMMEDIATELY FOLLOWING A MEAL 90 tablet 0  . nitroGLYCERIN (NITROSTAT) 0.4 MG SL tablet Place 0.4 mg under the tongue every 5 (five) minutes as  needed.      Ebony Ashley. oxybutynin (DITROPAN XL) 5 MG 24 hr tablet Take 1 tablet (5 mg total) by mouth at bedtime. 90 tablet 4  . vitamin E (VITAMIN E) 400 UNIT capsule Take 400 Units by mouth daily.     No current facility-administered medications for this visit.     Family History  Problem Relation Age of Onset  . Arthritis Mother   . Atrial fibrillation Mother [id number]  . Fibromyalgia Mother   . Osteoporosis Mother   . Colon cancer Mother [phone]  . Hypertension Brother   . Diabetes Brother   . Asthma Brother     ROS:  Pertinent items are noted in HPI.  Otherwise, a comprehensive ROS was negative.  Exam:   LMP 01/05/1988 (Approximate)    Ht Readings from Last 3 Encounters:  04/08/15 5\' 3"  (1.6 m)  11/05/14 5\' 3"  (1.6 m)  09/24/14 5\' 3"  (1.6 m)    General appearance: alert, cooperative and appears stated age Head: Normocephalic, without obvious abnormality, atraumatic Neck: no adenopathy, supple, symmetrical, trachea midline and thyroid normal to inspection and palpation Lungs: clear to auscultation bilaterally Breasts: normal appearance, no masses or tenderness Heart: regular rate and rhythm Abdomen: soft, non-tender; no masses,  no organomegaly Extremities: extremities normal, atraumatic, no cyanosis or edema Skin: Skin color, texture, turgor normal. No rashes or lesions Lymph nodes:  Cervical, supraclavicular, and axillary nodes normal. No abnormal inguinal nodes palpated Neurologic: Grossly normal   Pelvic: External genitalia:  no lesions              Urethra:  normal appearing urethra with no masses, tenderness or lesions              Bartholin's and Skene's: normal                 Vagina: normal appearing vagina with normal color and discharge, no lesions              Cervix: absent              Pap taken: No. Bimanual Exam:  Uterus:  uterus absent              Adnexa: no mass, fullness, tenderness               Rectovaginal: Confirms               Anus:  normal sphincter tone,  no lesions  Chaperone present: no  A:  Well Woman with normal exam      S/P TAH secondary to DUB 1990, off ERT 04/2009  OAB - doing well on Ditropan used only prn Obesity, DM, HTN, Metabolic syndrome  Recent family stressors with husbands illness and mother's passing   P:   Reviewed health and wellness pertinent to exam  Pap smear not done  Mammogram is due 02/2016  Refill on Ditropan for a year  IFOB is given  Counseled on breast self exam, mammography screening, adequate intake of calcium and vitamin D, diet and exercise, Kegel's exercises return annually or prn  An After Visit Summary was printed and given to the patient.

## 2015-10-02 NOTE — Progress Notes (Signed)
Encounter reviewed by Dr. Brook Amundson C. Silva.  

## 2015-10-23 LAB — FECAL OCCULT BLOOD, IMMUNOCHEMICAL: IFOBT: NEGATIVE

## 2015-10-23 NOTE — Addendum Note (Signed)
Addended by: Zenovia JordanMITCHELL, Oniel Meleski A on: 10/23/2015 10:52 AM   Modules accepted: Orders

## 2015-12-11 DIAGNOSIS — R748 Abnormal levels of other serum enzymes: Secondary | ICD-10-CM | POA: Diagnosis not present

## 2015-12-11 DIAGNOSIS — R749 Abnormal serum enzyme level, unspecified: Secondary | ICD-10-CM | POA: Diagnosis not present

## 2015-12-11 DIAGNOSIS — R197 Diarrhea, unspecified: Secondary | ICD-10-CM | POA: Diagnosis not present

## 2015-12-11 DIAGNOSIS — E215 Disorder of parathyroid gland, unspecified: Secondary | ICD-10-CM | POA: Diagnosis not present

## 2015-12-11 DIAGNOSIS — B372 Candidiasis of skin and nail: Secondary | ICD-10-CM | POA: Diagnosis not present

## 2015-12-11 DIAGNOSIS — I10 Essential (primary) hypertension: Secondary | ICD-10-CM | POA: Diagnosis not present

## 2015-12-11 DIAGNOSIS — E119 Type 2 diabetes mellitus without complications: Secondary | ICD-10-CM | POA: Diagnosis not present

## 2015-12-11 DIAGNOSIS — D582 Other hemoglobinopathies: Secondary | ICD-10-CM | POA: Diagnosis not present

## 2016-05-24 ENCOUNTER — Encounter: Payer: Self-pay | Admitting: Internal Medicine

## 2016-07-21 ENCOUNTER — Telehealth: Payer: Self-pay | Admitting: Obstetrics and Gynecology

## 2016-07-21 NOTE — Telephone Encounter (Signed)
Left message regarding upcoming appointment has been canceled and needs to be rescheduled. °

## 2016-07-28 DIAGNOSIS — H25813 Combined forms of age-related cataract, bilateral: Secondary | ICD-10-CM | POA: Diagnosis not present

## 2016-07-28 DIAGNOSIS — E119 Type 2 diabetes mellitus without complications: Secondary | ICD-10-CM | POA: Diagnosis not present

## 2016-07-28 DIAGNOSIS — H35372 Puckering of macula, left eye: Secondary | ICD-10-CM | POA: Diagnosis not present

## 2016-07-28 DIAGNOSIS — H35033 Hypertensive retinopathy, bilateral: Secondary | ICD-10-CM | POA: Diagnosis not present

## 2016-07-28 DIAGNOSIS — H43813 Vitreous degeneration, bilateral: Secondary | ICD-10-CM | POA: Diagnosis not present

## 2016-08-10 DIAGNOSIS — Z13220 Encounter for screening for lipoid disorders: Secondary | ICD-10-CM | POA: Diagnosis not present

## 2016-08-10 DIAGNOSIS — E119 Type 2 diabetes mellitus without complications: Secondary | ICD-10-CM | POA: Diagnosis not present

## 2016-08-10 DIAGNOSIS — Z1329 Encounter for screening for other suspected endocrine disorder: Secondary | ICD-10-CM | POA: Diagnosis not present

## 2016-08-10 DIAGNOSIS — I10 Essential (primary) hypertension: Secondary | ICD-10-CM | POA: Diagnosis not present

## 2016-08-13 ENCOUNTER — Other Ambulatory Visit: Payer: Self-pay | Admitting: Internal Medicine

## 2016-08-13 DIAGNOSIS — Z1231 Encounter for screening mammogram for malignant neoplasm of breast: Secondary | ICD-10-CM

## 2016-08-24 DIAGNOSIS — Z1212 Encounter for screening for malignant neoplasm of rectum: Secondary | ICD-10-CM | POA: Diagnosis not present

## 2016-08-24 DIAGNOSIS — Z1211 Encounter for screening for malignant neoplasm of colon: Secondary | ICD-10-CM | POA: Diagnosis not present

## 2016-08-24 LAB — COLOGUARD

## 2016-10-05 ENCOUNTER — Ambulatory Visit: Payer: Medicare Other | Admitting: Nurse Practitioner

## 2016-11-17 ENCOUNTER — Telehealth: Payer: Self-pay

## 2016-11-17 NOTE — Telephone Encounter (Signed)
Per [facility] called pt to make yearly CPE, had to LVM for pt to return call to schedule 

## 2016-11-22 ENCOUNTER — Telehealth: Payer: Self-pay

## 2016-11-22 NOTE — Telephone Encounter (Signed)
Pt said no for now but will discuss at CPE in next few weeks

## 2016-11-22 NOTE — Telephone Encounter (Signed)
Per Dr. Lenord FellersBaxley called her to see if she would be willing to try a statin medication because of her Diabetes diagnosis. Had to LVM

## 2016-12-22 ENCOUNTER — Other Ambulatory Visit: Payer: Self-pay | Admitting: Internal Medicine

## 2016-12-22 DIAGNOSIS — Z1329 Encounter for screening for other suspected endocrine disorder: Secondary | ICD-10-CM

## 2016-12-22 DIAGNOSIS — Z1321 Encounter for screening for nutritional disorder: Secondary | ICD-10-CM

## 2016-12-22 DIAGNOSIS — I10 Essential (primary) hypertension: Secondary | ICD-10-CM

## 2016-12-22 DIAGNOSIS — E119 Type 2 diabetes mellitus without complications: Secondary | ICD-10-CM

## 2016-12-22 DIAGNOSIS — Z0000 Encounter for general adult medical examination without abnormal findings: Secondary | ICD-10-CM

## 2016-12-22 DIAGNOSIS — Z13220 Encounter for screening for lipoid disorders: Secondary | ICD-10-CM

## 2016-12-24 ENCOUNTER — Encounter: Payer: Self-pay | Admitting: Internal Medicine

## 2016-12-24 ENCOUNTER — Other Ambulatory Visit: Payer: Medicare Other | Admitting: Internal Medicine

## 2016-12-24 DIAGNOSIS — I10 Essential (primary) hypertension: Secondary | ICD-10-CM

## 2016-12-24 DIAGNOSIS — Z0000 Encounter for general adult medical examination without abnormal findings: Secondary | ICD-10-CM

## 2016-12-24 DIAGNOSIS — Z13220 Encounter for screening for lipoid disorders: Secondary | ICD-10-CM

## 2016-12-24 DIAGNOSIS — R8290 Unspecified abnormal findings in urine: Secondary | ICD-10-CM | POA: Diagnosis not present

## 2016-12-24 DIAGNOSIS — Z1321 Encounter for screening for nutritional disorder: Secondary | ICD-10-CM

## 2016-12-24 DIAGNOSIS — Z1329 Encounter for screening for other suspected endocrine disorder: Secondary | ICD-10-CM | POA: Diagnosis not present

## 2016-12-24 DIAGNOSIS — E559 Vitamin D deficiency, unspecified: Secondary | ICD-10-CM | POA: Diagnosis not present

## 2016-12-24 DIAGNOSIS — E119 Type 2 diabetes mellitus without complications: Secondary | ICD-10-CM

## 2016-12-24 NOTE — Patient Instructions (Signed)
Labs drawn

## 2016-12-24 NOTE — Progress Notes (Signed)
Labs drawn

## 2016-12-24 NOTE — Addendum Note (Signed)
Addended by: Gregery NaVALENCIA, Roxanne Orner P on: 12/24/2016 02:37 PM   Modules accepted: Orders

## 2016-12-25 LAB — COMPLETE METABOLIC PANEL WITH GFR
AG Ratio: 1.7 (calc) (ref 1.0–2.5)
ALT: 16 U/L (ref 6–29)
AST: 16 U/L (ref 10–35)
Albumin: 3.8 g/dL (ref 3.6–5.1)
Alkaline phosphatase (APISO): 109 U/L (ref 33–130)
BUN: 11 mg/dL (ref 7–25)
CO2: 26 mmol/L (ref 20–32)
Calcium: 9.1 mg/dL (ref 8.6–10.4)
Chloride: 104 mmol/L (ref 98–110)
Creat: 0.64 mg/dL (ref 0.60–0.93)
GFR, Est African American: 103 mL/min/{1.73_m2} (ref >=60)
GFR, Est Non African American: 89 mL/min/{1.73_m2} (ref >=60)
Globulin: 2.2 g/dL (calc) (ref 1.9–3.7)
Glucose, Bld: 154 mg/dL — ABNORMAL HIGH (ref 65–99)
Potassium: 4.6 mmol/L (ref 3.5–5.3)
Sodium: 141 mmol/L (ref 135–146)
Total Bilirubin: 0.4 mg/dL (ref 0.2–1.2)
Total Protein: 6 g/dL — ABNORMAL LOW (ref 6.1–8.1)

## 2016-12-25 LAB — CBC WITH DIFFERENTIAL/PLATELET
Basophils Absolute: 58 cells/uL (ref 0–200)
Basophils Relative: 0.8 %
Eosinophils Absolute: 122 cells/uL (ref 15–500)
Eosinophils Relative: 1.7 %
HCT: 43.7 % (ref 35.0–45.0)
Hemoglobin: 14.6 g/dL (ref 11.7–15.5)
Lymphs Abs: 1267 cells/uL (ref 850–3900)
MCH: 29.6 pg (ref 27.0–33.0)
MCHC: 33.4 g/dL (ref 32.0–36.0)
MCV: 88.6 fL (ref 80.0–100.0)
MPV: 12.3 fL (ref 7.5–12.5)
Monocytes Relative: 8.8 %
Neutro Abs: 5119 cells/uL (ref 1500–7800)
Neutrophils Relative %: 71.1 %
Platelets: 267 10*3/uL (ref 140–400)
RBC: 4.93 10*6/uL (ref 3.80–5.10)
RDW: 13.5 % (ref 11.0–15.0)
Total Lymphocyte: 17.6 %
WBC mixed population: 634 cells/uL (ref 200–950)
WBC: 7.2 10*3/uL (ref 3.8–10.8)

## 2016-12-25 LAB — HEMOGLOBIN A1C
Hgb A1c MFr Bld: 7.2 % of total Hgb — ABNORMAL HIGH (ref <5.7)
Mean Plasma Glucose: 160 (calc)
eAG (mmol/L): 8.9 (calc)

## 2016-12-25 LAB — LIPID PANEL
Cholesterol: 149 mg/dL (ref <200)
HDL: 44 mg/dL — ABNORMAL LOW (ref >50)
LDL Cholesterol (Calc): 85 mg/dL (calc)
Non-HDL Cholesterol (Calc): 105 mg/dL (calc) (ref <130)
Total CHOL/HDL Ratio: 3.4 (calc) (ref <5.0)
Triglycerides: 100 mg/dL (ref <150)

## 2016-12-25 LAB — TSH: TSH: 0.97 mIU/L (ref 0.40–4.50)

## 2016-12-25 LAB — VITAMIN D 25 HYDROXY (VIT D DEFICIENCY, FRACTURES): Vit D, 25-Hydroxy: 40 ng/mL (ref 30–100)

## 2016-12-30 ENCOUNTER — Encounter: Payer: Medicare Other | Admitting: Internal Medicine

## 2017-01-06 ENCOUNTER — Other Ambulatory Visit: Payer: Self-pay

## 2017-01-06 NOTE — Telephone Encounter (Signed)
Medication refill request: Oxybutynin Last AEX:  09/30/15 PG Next AEX: 03/08/17 DL Last MMG (if hormonal medication request): 02/27/15 BIRADS 1 negative.density c -- patient would like to wait until AEX before getting MMG Refill authorized: 09/30/15 #90 w/4 refills; today please advise -- patient states that she is completely out of medication.

## 2017-01-11 NOTE — Telephone Encounter (Signed)
Will  Need to know if changes in medical status or blood pressure prior to one refill. Shes see PCP regularaly

## 2017-01-12 NOTE — Telephone Encounter (Signed)
Tried calling patient, no answer. Left message to return my call at [phone].

## 2017-01-13 MED ORDER — OXYBUTYNIN CHLORIDE ER 5 MG PO TB24: 5.0000 mg | ORAL_TABLET | Freq: Every day | ORAL | 0 refills | Status: DC

## 2017-01-13 NOTE — Telephone Encounter (Signed)
Spoke with patient, advised as seen below per Deborah Leonard, CNM. Patient verbalizes understanding and is agreeable. 

## 2017-01-13 NOTE — Telephone Encounter (Signed)
Will do one refill. 

## 2017-01-13 NOTE — Telephone Encounter (Signed)
Will do one refill  Until she has PCP visit.

## 2017-01-13 NOTE — Telephone Encounter (Signed)
Patient is returning call.  °

## 2017-01-13 NOTE — Telephone Encounter (Signed)
Spoke with patient. Patient denies any recent medical changes, last B/P was 6 months ago, unsure what it was, was not aware of it being abnormal.    Switched to [facility] for PCP for about a year, is  returning to Dr. Lenord FellersBaxley 02/15/17.   Advised will update Leota Sauerseborah Leonard, CNM and return call regarding refill. Patient is agreeable.   Leota Sauerseborah Leonard, CNM -please advise on refill?

## 2017-01-25 ENCOUNTER — Other Ambulatory Visit: Payer: Self-pay | Admitting: Internal Medicine

## 2017-01-25 MED ORDER — AMLODIPINE BESYLATE 10 MG PO TABS: 10.0000 mg | ORAL_TABLET | Freq: Every day | ORAL | 2 refills | Status: DC

## 2017-01-25 MED ORDER — AMLODIPINE BESYLATE 10 MG PO TABS: 10.0000 mg | ORAL_TABLET | Freq: Every day | ORAL | 0 refills | Status: DC

## 2017-01-25 NOTE — Telephone Encounter (Signed)
Refill through November 

## 2017-01-25 NOTE — Addendum Note (Signed)
Addended by: Gregery NaVALENCIA, Sarit Sparano P on: 01/25/2017 03:48 PM   Modules accepted: Orders

## 2017-01-25 NOTE — Telephone Encounter (Signed)
Patient has appointment with you on 2/12 for her CPE.  She has run out of her Amlodipine 10mg .  She is calling to request a refill please.    Pharmacy:  [facility] [address]  Best # for contact:  [phone]  Thank you.

## 2017-02-11 ENCOUNTER — Other Ambulatory Visit: Payer: Self-pay | Admitting: Certified Nurse Midwife

## 2017-02-11 NOTE — Telephone Encounter (Signed)
Medication refill request: oxybutynin Last AEX:  09-30-15  Next AEX: 03-08-17  Last MMG (if hormonal medication request): 02-27-15 WNL  Refill authorized: please advise

## 2017-02-15 ENCOUNTER — Ambulatory Visit (INDEPENDENT_AMBULATORY_CARE_PROVIDER_SITE_OTHER): Payer: Medicare Other | Admitting: Internal Medicine

## 2017-02-15 ENCOUNTER — Encounter: Payer: Self-pay | Admitting: Internal Medicine

## 2017-02-15 VITALS — BP 140/90 | HR 68 | Ht 63.0 in | Wt 260.0 lb

## 2017-02-15 DIAGNOSIS — Z6841 Body Mass Index (BMI) 40.0 and over, adult: Secondary | ICD-10-CM

## 2017-02-15 DIAGNOSIS — N390 Urinary tract infection, site not specified: Secondary | ICD-10-CM | POA: Diagnosis not present

## 2017-02-15 DIAGNOSIS — K2100 Gastro-esophageal reflux disease with esophagitis, without bleeding: Secondary | ICD-10-CM

## 2017-02-15 DIAGNOSIS — E119 Type 2 diabetes mellitus without complications: Secondary | ICD-10-CM

## 2017-02-15 DIAGNOSIS — R8290 Unspecified abnormal findings in urine: Secondary | ICD-10-CM

## 2017-02-15 DIAGNOSIS — I10 Essential (primary) hypertension: Secondary | ICD-10-CM

## 2017-02-15 DIAGNOSIS — Z0000 Encounter for general adult medical examination without abnormal findings: Secondary | ICD-10-CM

## 2017-02-15 DIAGNOSIS — N3941 Urge incontinence: Secondary | ICD-10-CM

## 2017-02-15 LAB — POCT URINALYSIS DIPSTICK
Appearance: ABNORMAL
Bilirubin, UA: NEGATIVE
Glucose, UA: NEGATIVE
Ketones, UA: NEGATIVE
Nitrite, UA: POSITIVE
Odor: ABNORMAL
Protein, UA: NEGATIVE
Spec Grav, UA: 1.01 (ref 1.010–1.025)
Urobilinogen, UA: 0.2 E.U./dL
pH, UA: 6.5 (ref 5.0–8.0)

## 2017-02-15 MED ORDER — OXYBUTYNIN CHLORIDE ER 10 MG PO TB24: 10.0000 mg | ORAL_TABLET | Freq: Every day | ORAL | 11 refills | Status: DC

## 2017-02-15 NOTE — Progress Notes (Signed)
Subjective:    Patient ID: Ebony Ashley, female    DOB: 1945-07-11, 72 y.o.   MRN: [id number]  HPI 72 year old Female for Medicare wellness, health maintenance and evaluation of medical issues.  History of type 2 diabetes mellitus, hypertension, esophageal spasm and esophagitis.  History of overactive bladder.  History of obesity.  Past medical history: Fractured left lower leg 1998.  Total abdominal hysterectomy without oophorectomy for menorrhagia in 1991.  Patient has had 2 D&C procedures in the past prior to her hysterectomy.  Colonoscopy in 2009 by Dr. Loreta Ave.  Tetanus immunization 2007.  Pneumovax immunization August 2011.  Prevnar done in 2016.  Tdap 2010.  GYN care have been done through [facility] growth nurse practitioner but she has retired.  We will do bimanual today.  Social history: Patient does not smoke.  Social alcohol consumption consisting of drinking wine cooler on vacation.  She graduated from [employer].  She formerly worked at [employer].  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  She is married.  One adult daughter and one adult son.  Husband has not been in good health recently.  Patient says Polysporin causes a rash and adhesive causes rash and irritation.  She had a 2D echocardiogram in 2009 showing mild left ventricular diastolic dysfunction.  This was done to evaluate murmur that both Dr.  Antoine Poche and I heard.  He thought it was probably some septal hypertrophy.  2D echo did not show valvular stenosis.  Bone density study in 2010 with normal T-scores.  Family history: Mother died with colon cancer at age 81.  Father died at age 69 in an automobile accident.  Step brother died at age 75 of a stroke.  No sisters.    Review of Systems  Constitutional: Negative.   Respiratory: Negative.   Cardiovascular: Negative.   Gastrointestinal: Negative.   Genitourinary: Negative.        Objective:   Physical Exam  Constitutional: She is oriented to person, place, and time. She appears well-developed and well-nourished. No distress.  HENT:  Head: Normocephalic and atraumatic.  Right Ear: External ear normal.  Left Ear: External ear normal.  Mouth/Throat: Oropharynx is clear and moist.  Eyes: Conjunctivae and EOM are normal. Pupils are equal, round, and reactive to light. Right eye exhibits no discharge. Left eye exhibits no discharge. No scleral icterus.  Neck: Neck supple. No JVD present. No thyromegaly present.  Cardiovascular: Normal rate and regular rhythm.  Murmur heard. 1/6 to 2/6 systolic ejection murmur heard along the upper left sternal border and radiates into both carotids  Pulmonary/Chest: Effort normal and breath sounds normal. She has no wheezes.  Abdominal: Soft. Bowel sounds are normal. She exhibits no distension and no mass. There is no tenderness. There is no rebound and no guarding.  Genitourinary:  Genitourinary Comments: Pap deferred due to hysterectomy.  Bimanual normal.  Musculoskeletal: She exhibits no edema.  Lymphadenopathy:    She has no cervical adenopathy.  Neurological: She is alert and oriented to person, place, and time. She has normal reflexes. No cranial nerve deficit. Coordination normal.  Skin: Skin is warm and dry. No rash noted. She is not diaphoretic.  Psychiatric: She has a normal mood and affect. Her behavior is normal. Judgment and thought content normal.  Vitals reviewed.         Assessment & Plan:  Essential hypertension continue same medications Controlled type  2 diabetes mellitus  History of GE reflux and esophagitis treated with Prilosec  Obesity  History of cardiac murmur-stable  Urge urinary incontinence treated with Ditropan  Family history of colon cancer-discussed need for colonoscopy.  She thinks it would be problematic with someone trying to sit with her husband but will consider it.  The other option  is Cologuard  Plan: Return in 1 year or as needed.  Has situational stress with husband who has had a stroke and needs her attention.  Try to watch diet and get some exercise.  Addendum: Urine dipstick was abnormal.  Culture was sent and grew E. coli.  Will be treated with Cipro 250 mg twice a day for 10 days with nurse visit in 2 weeks.  Hemoglobin A1c has increased to 7.2%.  She is on metformin 850 mg daily and will watch her diet more closely.  Has been worried about her husband.  Subjective:   Patient presents for Medicare Annual/Subsequent preventive examination.  Review Past Medical/Family/Social: See above  Risk Factors  Current exercise habits: Not all that physically active Dietary issues discussed: Low-fat low carbohydrate  Cardiac risk factors: Obesity and diabetes  Depression Screen  (Note: if answer to either of the following is "Yes", a more complete depression screening is indicated)   Over the past two weeks, have you felt down, depressed or hopeless? No just tired due to stress with husband Over the past two weeks, have you felt little interest or pleasure in doing things? No Have you lost interest or pleasure in daily life? No Do you often feel hopeless? No Do you cry easily over simple problems? No   Activities of Daily Living  In your present state of health, do you have any difficulty performing the following activities?:   Driving? No  Managing money? No  Feeding yourself? No  Getting from bed to chair? No  Climbing a flight of stairs? No  Preparing food and eating?: No  Bathing or showering? No  Getting dressed: No  Getting to the toilet? No  Using the toilet:No  Moving around from place to place: No  In the past year have you fallen or had a near fall?:No  Are you sexually active? No  Do you have more than one partner? No   Hearing Difficulties: No  Do you often ask people to speak up or repeat themselves? No  Do you experience ringing or noises  in your ears? No  Do you have difficulty understanding soft or whispered voices? No  Do you feel that you have a problem with memory?  Yes sometimes Do you often misplace items?  yes sometimes   Home Safety:  Do you have a smoke alarm at your residence? Yes Do you have grab bars in the bathroom?  No Do you have throw rugs in your house?  No   Cognitive Testing  Alert? Yes Normal Appearance?Yes  Oriented to person? Yes Place? Yes  Time? Yes  Recall of three objects? Yes  Can perform simple calculations? Yes  Displays appropriate judgment?Yes  Can read the correct time from a watch face?Yes   List the Names of Other Physician/Practitioners you currently use:  See referral list for the physicians patient is currently seeing.     Review of Systems: See above   Objective:     General appearance: Appears stated age and obese  Head: Normocephalic, without obvious abnormality, atraumatic  Eyes: conj clear, EOMi PEERLA  Ears: normal TM's and external ear  canals both ears  Nose: Nares normal. Septum midline. Mucosa normal. No drainage or sinus tenderness.  Throat: lips, mucosa, and tongue normal; teeth and gums normal  Neck: no adenopathy, no carotid bruit, no JVD, supple, symmetrical, trachea midline and thyroid not enlarged, symmetric, no tenderness/mass/nodules  No CVA tenderness.  Lungs: clear to auscultation bilaterally  Breasts: normal appearance, no masses or tenderness Heart: regular rate and rhythm, S1, S2 normal, murmur present Abdomen: soft, non-tender; bowel sounds normal; no masses, no organomegaly  Musculoskeletal: ROM normal in all joints, no crepitus, no deformity, Normal muscle strengthen. Back  is symmetric, no curvature. Skin: Skin color, texture, turgor normal. No rashes or lesions  Lymph nodes: Cervical, supraclavicular, and axillary nodes normal.  Neurologic: CN 2 -12 Normal, Normal symmetric reflexes. Normal coordination and gait  Psych: Alert & Oriented x  3, Mood appear stable.    Assessment:    Annual wellness medicare exam   Plan:    During the course of the visit the patient was educated and counseled about appropriate screening and preventive services including:   Recommend annual flu vaccine  Recommend annual mammogram     Patient Instructions (the written plan) was given to the patient.  Medicare Attestation  I have personally reviewed:  The patient's medical and social history  Their use of alcohol, tobacco or illicit drugs  Their current medications and supplements  The patient's functional ability including ADLs,fall risks, home safety risks, cognitive, and hearing and visual impairment  Diet and physical activities  Evidence for depression or mood disorders  The patient's weight, height, BMI, and visual acuity have been recorded in the chart. I have made referrals, counseling, and provided education to the patient based on review of the above and I have provided the patient with a written personalized care plan for preventive services.

## 2017-02-17 ENCOUNTER — Other Ambulatory Visit: Payer: Self-pay | Admitting: Internal Medicine

## 2017-02-17 LAB — URINE CULTURE
MICRO NUMBER:: 90186797
SPECIMEN QUALITY:: ADEQUATE

## 2017-02-17 MED ORDER — CIPROFLOXACIN HCL 250 MG PO TABS: 250.0000 mg | ORAL_TABLET | Freq: Two times a day (BID) | ORAL | 0 refills | Status: AC

## 2017-03-02 NOTE — Patient Instructions (Addendum)
It was a pleasure to see you today.  Culture is pending of urine.  Try to get some exercise and watch diet.  Continue metformin for diabetes  Addendum: Urinary tract infection will be treated with Cipro with follow-up in 2 weeks.  Continue antihypertensive medication and PPI.

## 2017-03-03 ENCOUNTER — Encounter: Payer: Medicare Other | Admitting: Internal Medicine

## 2017-03-07 ENCOUNTER — Ambulatory Visit (INDEPENDENT_AMBULATORY_CARE_PROVIDER_SITE_OTHER): Payer: Medicare Other | Admitting: Internal Medicine

## 2017-03-07 VITALS — BP 120/80 | HR 82 | Temp 99.1°F

## 2017-03-07 DIAGNOSIS — R8290 Unspecified abnormal findings in urine: Secondary | ICD-10-CM

## 2017-03-07 DIAGNOSIS — N390 Urinary tract infection, site not specified: Secondary | ICD-10-CM

## 2017-03-07 LAB — POCT URINALYSIS DIPSTICK
Appearance: NORMAL
Bilirubin, UA: NEGATIVE
Blood, UA: NEGATIVE
Glucose, UA: NEGATIVE
Ketones, UA: NEGATIVE
Leukocytes, UA: NEGATIVE
Nitrite, UA: NEGATIVE
Odor: NORMAL
Protein, UA: NEGATIVE
Spec Grav, UA: 1.015 (ref 1.010–1.025)
Urobilinogen, UA: 0.2 E.U./dL
pH, UA: 6.5 (ref 5.0–8.0)

## 2017-03-08 ENCOUNTER — Ambulatory Visit: Payer: Self-pay | Admitting: Certified Nurse Midwife

## 2017-03-08 LAB — URINE CULTURE
MICRO NUMBER:: 90275133
Result:: NO GROWTH
SPECIMEN QUALITY:: ADEQUATE

## 2017-03-16 ENCOUNTER — Encounter: Payer: Self-pay | Admitting: Internal Medicine

## 2017-03-16 NOTE — Patient Instructions (Signed)
Dipstick UA status post treatment for UTI now normal

## 2017-03-16 NOTE — Progress Notes (Signed)
Nurse visit to follow up on UTI s/p treatment. Dipstick U/A now normal

## 2017-04-14 ENCOUNTER — Ambulatory Visit: Payer: Self-pay | Admitting: Certified Nurse Midwife

## 2017-04-24 ENCOUNTER — Other Ambulatory Visit: Payer: Self-pay | Admitting: Internal Medicine

## 2017-05-19 ENCOUNTER — Encounter: Payer: Self-pay | Admitting: Certified Nurse Midwife

## 2017-05-19 ENCOUNTER — Ambulatory Visit (INDEPENDENT_AMBULATORY_CARE_PROVIDER_SITE_OTHER): Payer: Medicare Other | Admitting: Certified Nurse Midwife

## 2017-05-19 ENCOUNTER — Other Ambulatory Visit: Payer: Self-pay

## 2017-05-19 VITALS — BP 140/82 | HR 70 | Resp 16 | Ht 62.5 in | Wt 262.0 lb

## 2017-05-19 DIAGNOSIS — N6320 Unspecified lump in the left breast, unspecified quadrant: Secondary | ICD-10-CM | POA: Diagnosis not present

## 2017-05-19 DIAGNOSIS — Z01419 Encounter for gynecological examination (general) (routine) without abnormal findings: Secondary | ICD-10-CM | POA: Diagnosis not present

## 2017-05-19 DIAGNOSIS — N951 Menopausal and female climacteric states: Secondary | ICD-10-CM

## 2017-05-19 DIAGNOSIS — Z780 Asymptomatic menopausal state: Secondary | ICD-10-CM | POA: Diagnosis not present

## 2017-05-19 DIAGNOSIS — Z01411 Encounter for gynecological examination (general) (routine) with abnormal findings: Secondary | ICD-10-CM | POA: Diagnosis not present

## 2017-05-19 NOTE — Progress Notes (Signed)
Patient scheduled while in office for bilateral Dx MMG and left breast US, if needed. Scheduled at [facility] on 05/24/17 arriving at 10:40am for 11am appt. Patient verbalizes understanding and is agreeable.

## 2017-05-19 NOTE — Patient Instructions (Signed)

## 2017-05-19 NOTE — Progress Notes (Signed)
72 y.o. G3P2002 Married  Caucasian Fe here for annual exam. Post menopausal. Denies vaginal bleeding, ? Vaginal dryness. Busy with caring for spouse who is confined to wheelchair, with balance issues. Continues urgency/frequency at times, occasional leaking with vigorous cough only.. Has decreased Oxybuynin use, does not feel has helped. Sees Dr. Lenord Fellers for hypertension and glucose management, aex and labs. Weight has stained about the same, trying to eat healthy. She has noted balance issues if she jumps up to quickly, but no dizziness. No other health issues today.  Patient's last menstrual period was 01/05/1988 (approximate).          Sexually active: No.  The current method of family planning is status post hysterectomy.    Exercising: No.  exercise Smoker:  no  Health Maintenance: Pap:  09-18-01 neg History of Abnormal Pap: no MMG:  02-27-15 category c density birads 1:neg Self Breast exams: occ Colonoscopy:  2010 f/u 31yrs BMD:   2010  Declines repeat at this time TDaP:  2017 Shingles: 2014 Pneumonia: 2016 Hep C and HIV: hep c done with pcp Labs: no   reports that she has never smoked. She has never used smokeless tobacco. She reports that she drinks alcohol. She reports that she does not use drugs.  Past Medical History:  Diagnosis Date  . Diabetes mellitus   . Esophageal spasm   . Esophagitis   . Hiatal hernia   . Hypertension     Past Surgical History:  Procedure Laterality Date  . ABDOMINAL HYSTERECTOMY  1990   DUB, ovaries remain  Dr. Laurena Bering, off ERT 4/11  . TONSILLECTOMY AND ADENOIDECTOMY  1951    Current Outpatient Medications  Medication Sig Dispense Refill  . amLODipine (NORVASC) 10 MG tablet TAKE 1 TABLET BY MOUTH EVERY DAY 90 tablet 0  . Ascorbic Acid (VITAMIN C) 1000 MG tablet Take 1,000 mg by mouth daily.    Marland Kitchen CALCIUM PO Take 1,500 mg by mouth daily. Three tabs daily    . cholecalciferol (VITAMIN D) 1000 UNITS tablet Take 1,000 Units by mouth daily.       . clotrimazole (LOTRIMIN) 1 % cream Apply 1 application topically 2 (two) times daily.    . meclizine (ANTIVERT) 25 MG tablet Take 1 tablet by mouth 2 (two) times daily.    . metFORMIN (GLUCOPHAGE) 850 MG tablet Take 1 tablet by mouth daily.    . metoprolol succinate (TOPROL-XL) 100 MG 24 hr tablet TAKE 1 BY MOUTH DAILY WITH OR IMMEDIATELY FOLLOWING A MEAL 90 tablet 0  . nitroGLYCERIN (NITROSTAT) 0.4 MG SL tablet Place 0.4 mg under the tongue every 5 (five) minutes as needed.      Marland Kitchen omeprazole (PRILOSEC) 40 MG capsule Take 40 mg by mouth daily.    Marland Kitchen oxybutynin (DITROPAN XL) 10 MG 24 hr tablet Take 1 tablet (10 mg total) by mouth at bedtime. 30 tablet 11  . traMADol (ULTRAM) 50 MG tablet Take 50 mg by mouth every 12 (twelve) hours as needed (as needed).    . vitamin E (VITAMIN E) 400 UNIT capsule Take 400 Units by mouth daily.     No current facility-administered medications for this visit.     Family History  Problem Relation Age of Onset  . Arthritis Mother   . Atrial fibrillation Mother 36  . Fibromyalgia Mother   . Osteoporosis Mother   . Colon cancer Mother 43  . Hypertension Brother   . Diabetes Brother   . Asthma Brother  ROS:  Pertinent items are noted in HPI.  Otherwise, a comprehensive ROS was negative.  Exam:   LMP 01/05/1988 (Approximate)    Ht Readings from Last 3 Encounters:  02/15/17  (1.6 m)  09/30/15 5' 2.75" (1.594 m)  04/08/15  (1.6 m)    General appearance: alert, cooperative and appears stated age Head: Normocephalic, without obvious abnormality, atraumatic Neck: no adenopathy, supple, symmetrical, trachea midline and thyroid normal to inspection and palpation Lungs: clear to auscultation bilaterally CVAT negative bilateral Breasts: normal appearance, no masses or tenderness, No nipple retraction or dimpling, No nipple discharge or bleeding, No axillary or supraclavicular adenopathy, left breast mass noted in mid upper quadrant of breast,  firm to feel, palpated sitting and supine, non tender, Heart: regular rate and rhythm Abdomen: soft, non-tender; no masses,  no organomegaly, negative suprapubic Extremities: extremities normal, atraumatic, no cyanosis or edema Skin: Skin color, texture, turgor normal. No rashes or lesions Lymph nodes: Cervical, supraclavicular, and axillary nodes normal. No abnormal inguinal nodes palpated Neurologic: Grossly normal   Pelvic: External genitalia:  no lesions, normal female , atrophic appearance              Urethra:  normal appearing urethra with no masses, tenderness or lesions  Bladder, urethral meatus non tender              Bartholin's and Skene's: normal                 Vagina:atrophic appearing vagina with normal color and discharge, no lesions, no cystocele noted, some dryness noted at introitus and around urinary meatus              Cervix: absent              Pap taken: No. Bimanual Exam:  Uterus:  uterus absent              Adnexa: no mass, fullness, tenderness and adnexal not palpable, no masses noted               Rectovaginal: Confirms               Anus:  normal sphincter tone, no lesions  Chaperone present: yes  A:  Well Woman with normal exam  Post menopausal no HRT s/p TAH with ovaries retained, bleeding  Vaginal dryness  Left breast mass  Urinary urgency with Ditropan XL use,no change in urgency  MD management of glucose, hypertension management.  Morbid obesity, not interested in weight loss plan at present, aware of health concerns with weight.  P:   Reviewed health and wellness pertinent to exam  Discussed due to menopause vaginal dryness noted at entrance to vagina and vulva area. Discussed dryness and urgency and frequency connection. Discussed patting with urination and use of coconut oil or Olive oil for moisture in introital and urinary meatus area to see if this resolves the urgency. Patient will try and advise. Declines refill on Ditropan  Discussed  breast mass finding and need for evaluation with diagnostic mammogram and [place]. Patient agreeable. Patient last mammogram in 2017. She will be scheduled prior to leaving today.  Continue follow up with MD as  indicated  Pap smear: no   counseled on breast self exam, mammography screening, feminine hygiene, adequate intake of calcium and vitamin D, diet and exercise  return annually or prn  An After Visit Summary was printed and given to the patient.

## 2017-05-24 ENCOUNTER — Ambulatory Visit
Admission: RE | Admit: 2017-05-24 | Discharge: 2017-05-24 | Disposition: A | Discharge status: Home / Self Care | Payer: Medicare Other | Source: Ambulatory Visit | Attending: Certified Nurse Midwife | Admitting: Certified Nurse Midwife

## 2017-05-24 ENCOUNTER — Other Ambulatory Visit: Payer: Self-pay | Admitting: Certified Nurse Midwife

## 2017-05-24 DIAGNOSIS — R922 Inconclusive mammogram: Secondary | ICD-10-CM | POA: Diagnosis not present

## 2017-05-24 DIAGNOSIS — N6320 Unspecified lump in the left breast, unspecified quadrant: Secondary | ICD-10-CM

## 2017-05-24 DIAGNOSIS — N6322 Unspecified lump in the left breast, upper inner quadrant: Secondary | ICD-10-CM | POA: Diagnosis not present

## 2017-06-22 ENCOUNTER — Telehealth: Payer: Self-pay

## 2017-06-22 NOTE — Telephone Encounter (Signed)
Spoke with patient she needs to come in for an A1C, she said she will call back to schedule.

## 2017-06-30 ENCOUNTER — Other Ambulatory Visit: Payer: Self-pay | Admitting: Internal Medicine

## 2017-06-30 DIAGNOSIS — E119 Type 2 diabetes mellitus without complications: Secondary | ICD-10-CM

## 2017-07-05 ENCOUNTER — Other Ambulatory Visit: Payer: Medicare Other | Admitting: Internal Medicine

## 2017-07-05 DIAGNOSIS — E119 Type 2 diabetes mellitus without complications: Secondary | ICD-10-CM

## 2017-07-06 LAB — HEMOGLOBIN A1C
Hgb A1c MFr Bld: 7.4 % of total Hgb — ABNORMAL HIGH (ref <5.7)
Mean Plasma Glucose: 166 (calc)
eAG (mmol/L): 9.2 (calc)

## 2017-07-21 ENCOUNTER — Telehealth: Payer: Self-pay | Admitting: Internal Medicine

## 2017-07-21 DIAGNOSIS — E118 Type 2 diabetes mellitus with unspecified complications: Secondary | ICD-10-CM

## 2017-07-21 DIAGNOSIS — I10 Essential (primary) hypertension: Secondary | ICD-10-CM

## 2017-07-21 MED ORDER — SITAGLIPTIN PHOSPHATE 50 MG PO TABS: 50.0000 mg | ORAL_TABLET | Freq: Every day | ORAL | 1 refills | Status: DC

## 2017-07-21 MED ORDER — METOPROLOL SUCCINATE ER 100 MG PO TB24: ORAL_TABLET | ORAL | 0 refills | Status: DC

## 2017-07-21 NOTE — Telephone Encounter (Signed)
Patient returned the call today and I went over lab results with her regarding her A1C.  She is willing to try the Januvia [id number]$  daily.    Pharmacy:  [facility] in [place].  Phone:  [phone]  She is to return to the office for A1C and OV in 8 weeks.  We did NOT make this appointment until we KNOW whether her insurance will require authorization or not.  Once we KNOW she can start this medication, we can then make her appointment to return to the office at that time.  She ALSO needs refill on Metoprolol Succinate 100mg .    Thank you so much.

## 2017-07-23 ENCOUNTER — Other Ambulatory Visit: Payer: Self-pay | Admitting: Internal Medicine

## 2017-08-03 DIAGNOSIS — H35372 Puckering of macula, left eye: Secondary | ICD-10-CM | POA: Diagnosis not present

## 2017-08-03 DIAGNOSIS — H43813 Vitreous degeneration, bilateral: Secondary | ICD-10-CM | POA: Diagnosis not present

## 2017-08-03 DIAGNOSIS — H5212 Myopia, left eye: Secondary | ICD-10-CM | POA: Diagnosis not present

## 2017-08-03 DIAGNOSIS — H35033 Hypertensive retinopathy, bilateral: Secondary | ICD-10-CM | POA: Diagnosis not present

## 2017-08-03 DIAGNOSIS — E119 Type 2 diabetes mellitus without complications: Secondary | ICD-10-CM | POA: Diagnosis not present

## 2017-08-12 ENCOUNTER — Other Ambulatory Visit: Payer: Self-pay | Admitting: Internal Medicine

## 2017-08-12 DIAGNOSIS — E118 Type 2 diabetes mellitus with unspecified complications: Secondary | ICD-10-CM

## 2017-08-12 NOTE — Telephone Encounter (Signed)
Needs AIC and OV before we can refill. Medicare is asking this be done

## 2017-08-12 NOTE — Telephone Encounter (Signed)
Appointment was scheduled.

## 2017-08-13 ENCOUNTER — Other Ambulatory Visit: Payer: Self-pay | Admitting: Internal Medicine

## 2017-08-23 ENCOUNTER — Ambulatory Visit: Payer: Medicare Other | Admitting: Internal Medicine

## 2017-09-06 ENCOUNTER — Other Ambulatory Visit: Payer: Self-pay

## 2017-09-06 MED ORDER — AMLODIPINE BESYLATE 10 MG PO TABS
10.0000 mg | ORAL_TABLET | Freq: Every day | ORAL | 0 refills | Status: DC
Start: 2017-09-06 — End: 2017-10-30

## 2017-09-13 ENCOUNTER — Ambulatory Visit (INDEPENDENT_AMBULATORY_CARE_PROVIDER_SITE_OTHER): Payer: Medicare Other | Admitting: Internal Medicine

## 2017-09-13 ENCOUNTER — Encounter: Payer: Self-pay | Admitting: Internal Medicine

## 2017-09-13 VITALS — BP 130/88 | HR 60 | Ht 62.5 in | Wt 259.0 lb

## 2017-09-13 DIAGNOSIS — F4321 Adjustment disorder with depressed mood: Secondary | ICD-10-CM

## 2017-09-13 DIAGNOSIS — I10 Essential (primary) hypertension: Secondary | ICD-10-CM | POA: Diagnosis not present

## 2017-09-13 DIAGNOSIS — E118 Type 2 diabetes mellitus with unspecified complications: Secondary | ICD-10-CM | POA: Diagnosis not present

## 2017-09-13 DIAGNOSIS — N3941 Urge incontinence: Secondary | ICD-10-CM

## 2017-09-13 DIAGNOSIS — Z23 Encounter for immunization: Secondary | ICD-10-CM

## 2017-09-13 DIAGNOSIS — E119 Type 2 diabetes mellitus without complications: Secondary | ICD-10-CM

## 2017-09-13 DIAGNOSIS — F5102 Adjustment insomnia: Secondary | ICD-10-CM

## 2017-09-13 NOTE — Progress Notes (Signed)
   Subjective:    Patient ID: Ebony Ashley, female    DOB: [birth year], 72 y.o.   MRN: [id number]  HPI 72 year old Female in today after loss of her husband.  He apparently had an acute arrest at home and did not recover.  Patient is grieving.  Having some issues sleeping.  Have given small dose of Xanax to take sparingly.  She has a history of hypertension, urge urinary incontinence, controlled type 2 diabetes mellitus, GE reflux She is on metformin and Januvia for diabetes.  Hemoglobin A1c 7.6% in the couple of months ago was 7.4%.  Having some issues with back pain likely due to strain from trying to lift her husband.  Blood pressure is stable at 130/88.  BMI 46.62  History of GE reflux treated with Nexium  Review of Systems see above     Objective:   Physical Exam Neck is supple without JVD thyromegaly or carotid bruits.  Chest clear to auscultation.  Cardiac exam regular rate and rhythm normal S1 and S2.  Extremities without edema.  Straight leg raising is negative at 90 degrees bilaterally.  Deep tendon reflexes 1+ and symmetrical in the knees and absent in ankles.  Normal muscle strength in the lower extremities.       Assessment & Plan:  Controlled type 2 diabetes mellitus-continue metformin and Januvia.  Reassess in February.  Low back pain-likely mechanical due to lifting husband.  Consider physical therapy if not improving.  Essential hypertension-stable.  Diastolic pressure slightly elevated today but patient is crying in the office.  Urge urinary incontinence treated with Ditropan.  Grief reaction and insomnia-Xanax prescription given.  25 minutes spent with patient

## 2017-09-14 LAB — HEMOGLOBIN A1C
Hgb A1c MFr Bld: 7.6 % of total Hgb — ABNORMAL HIGH (ref <5.7)
Mean Plasma Glucose: 171 (calc)
eAG (mmol/L): 9.5 (calc)

## 2017-09-14 LAB — MICROALBUMIN / CREATININE URINE RATIO
Creatinine, Urine: 25 mg/dL (ref 20–275)
Microalb Creat Ratio: 20 mcg/mg creat (ref <30)
Microalb, Ur: 0.5 mg/dL

## 2017-10-02 ENCOUNTER — Encounter: Payer: Self-pay | Admitting: Internal Medicine

## 2017-10-02 MED ORDER — ALPRAZOLAM 0.5 MG PO TABS: 0.5000 mg | ORAL_TABLET | Freq: Every evening | ORAL | 2 refills | Status: DC | PRN

## 2017-10-02 NOTE — Patient Instructions (Addendum)
We are sorry to hear about the loss of your husband.  Take Xanax sparingly as needed especially for anxiety and insomnia.  Continue same medications and follow-up in 6 months.  Flu vaccine given.  If back pain persists, consider physical therapy

## 2017-10-15 ENCOUNTER — Other Ambulatory Visit: Payer: Self-pay | Admitting: Internal Medicine

## 2017-10-15 DIAGNOSIS — I10 Essential (primary) hypertension: Secondary | ICD-10-CM

## 2017-10-30 ENCOUNTER — Other Ambulatory Visit: Payer: Self-pay | Admitting: Internal Medicine

## 2017-11-13 ENCOUNTER — Other Ambulatory Visit: Payer: Self-pay | Admitting: Internal Medicine

## 2017-11-13 DIAGNOSIS — E118 Type 2 diabetes mellitus with unspecified complications: Secondary | ICD-10-CM

## 2017-11-17 ENCOUNTER — Other Ambulatory Visit: Payer: Self-pay | Admitting: Internal Medicine

## 2017-11-17 ENCOUNTER — Telehealth: Payer: Self-pay | Admitting: Certified Nurse Midwife

## 2017-11-17 MED ORDER — OXYBUTYNIN CHLORIDE ER 10 MG PO TB24: 10.0000 mg | ORAL_TABLET | Freq: Every day | ORAL | 3 refills | Status: DC

## 2017-11-17 NOTE — Telephone Encounter (Signed)
Done

## 2017-11-17 NOTE — Telephone Encounter (Signed)
Refill x one year °

## 2017-11-17 NOTE — Telephone Encounter (Signed)
Pharmacy calling for refill request for Oxybutynin 10 mg. Patient at the pharmacy now. [phone]

## 2017-11-17 NOTE — Telephone Encounter (Signed)
Please refill x one year 

## 2017-11-17 NOTE — Telephone Encounter (Signed)
Routing to Dr. Lenord FellersBaxley as this prescription is written by Dr. Lenord FellersBaxley.

## 2017-11-17 NOTE — Telephone Encounter (Signed)
Pharmacy calling to request a refill for patient. Pharmacist said last time this rx was filled was in February. They are requesting 90 days for insurance coverage. Thanks.

## 2017-11-18 MED ORDER — OXYBUTYNIN CHLORIDE ER 10 MG PO TB24: 10.0000 mg | ORAL_TABLET | Freq: Every day | ORAL | 0 refills | Status: DC

## 2017-11-29 ENCOUNTER — Other Ambulatory Visit: Payer: Self-pay | Admitting: Certified Nurse Midwife

## 2017-11-29 ENCOUNTER — Ambulatory Visit
Admission: RE | Admit: 2017-11-29 | Discharge: 2017-11-29 | Disposition: A | Discharge status: Home / Self Care | Payer: Medicare Other | Source: Ambulatory Visit | Attending: Certified Nurse Midwife | Admitting: Certified Nurse Midwife

## 2017-11-29 DIAGNOSIS — R922 Inconclusive mammogram: Secondary | ICD-10-CM | POA: Diagnosis not present

## 2017-11-29 DIAGNOSIS — N6320 Unspecified lump in the left breast, unspecified quadrant: Secondary | ICD-10-CM

## 2017-11-29 DIAGNOSIS — N6322 Unspecified lump in the left breast, upper inner quadrant: Secondary | ICD-10-CM | POA: Diagnosis not present

## 2017-11-30 ENCOUNTER — Other Ambulatory Visit: Payer: Self-pay | Admitting: Certified Nurse Midwife

## 2017-11-30 ENCOUNTER — Other Ambulatory Visit: Payer: Self-pay | Admitting: Internal Medicine

## 2017-11-30 NOTE — Telephone Encounter (Signed)
Medication refill request: oxybutynin  Last AEX:  06/30/15 PG Next AEX: 05/23/18  Last MMG (if hormonal medication request): 11/29/17 US left BIRADS3:Probably benign. appt 05/31/18 Refill authorized: Rx sent yesterday by Dr. Lenord FellersBaxley

## 2017-12-06 ENCOUNTER — Ambulatory Visit (HOSPITAL_COMMUNITY)
Admission: RE | Admit: 2017-12-06 | Discharge: 2017-12-06 | Disposition: A | Discharge status: Home / Self Care | Payer: Medicare Other | Source: Ambulatory Visit | Attending: Internal Medicine | Admitting: Internal Medicine

## 2017-12-06 ENCOUNTER — Ambulatory Visit (INDEPENDENT_AMBULATORY_CARE_PROVIDER_SITE_OTHER): Payer: Medicare Other | Admitting: Internal Medicine

## 2017-12-06 ENCOUNTER — Encounter: Payer: Self-pay | Admitting: Internal Medicine

## 2017-12-06 ENCOUNTER — Telehealth: Payer: Self-pay

## 2017-12-06 VITALS — BP 120/80 | HR 67 | Temp 98.3°F | Ht 62.5 in | Wt 263.0 lb

## 2017-12-06 DIAGNOSIS — M79605 Pain in left leg: Secondary | ICD-10-CM | POA: Diagnosis not present

## 2017-12-06 NOTE — Telephone Encounter (Signed)
Call pt and tell her no DVT. Ask her to place warm hot compresses on leg 20 minutes twice a day. Return on Thursday.

## 2017-12-06 NOTE — Progress Notes (Signed)
   Subjective:    Patient ID: Ebony RosalesClaudia S Ashley, female    DOB: 1945-11-15, 72 y.o.   MRN: [id number]  HPI 72 year old Female in for pain left medial lower leg with swelling onset 8 days ago.  Denies injury.  History of diabetes mellitus, esophageal spasm and esophagitis.  History of fractured left lower leg 1998.  Patient does not smoke.  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  History of mild left ventricular diastolic dysfunction and cardiac murmur thought to be due to septal hypertrophy.  2D echo did not show valvular stenosis.  No family history of clotting disorders.  No previous history of DVT.    Review of Systems denies any sort of injury to the leg at all.     Objective:   Physical Exam  Left lower leg below her knee medial aspect is very firm and tender.  There is increased warmth in the left lower leg as well.  There is a firmness that is palpable medial left upper leg below her knee.      Assessment & Plan:  Possible DVT left lower extremity versus cellulitis  Plan: Needs a stat Doppler of the left lower extremity with further instructions to follow.

## 2017-12-06 NOTE — Telephone Encounter (Signed)
Pt was notified of results and instructions, pt verbalized understanding.  Appt was scheduled.

## 2017-12-06 NOTE — Patient Instructions (Signed)
To have Doppler of the left lower extremity 3 PM today

## 2017-12-06 NOTE — Telephone Encounter (Signed)
Ebony Ashley called with results left leg doppler was negative for DVT.

## 2017-12-06 NOTE — Progress Notes (Signed)
Left lower extremity venous duplex has been completed. Negative for obvious evidence of DVT. Results were given to Araceli at Dr. Beryle QuantBaxley's office.  12/06/17 3:09 PM Olen CordialGreg Murrell Dome RVT

## 2017-12-08 ENCOUNTER — Encounter: Payer: Self-pay | Admitting: Internal Medicine

## 2017-12-08 ENCOUNTER — Ambulatory Visit (INDEPENDENT_AMBULATORY_CARE_PROVIDER_SITE_OTHER): Payer: Medicare Other | Admitting: Internal Medicine

## 2017-12-08 VITALS — BP 120/80 | HR 60 | Temp 98.1°F | Ht 62.5 in | Wt 258.0 lb

## 2017-12-08 DIAGNOSIS — L03116 Cellulitis of left lower limb: Secondary | ICD-10-CM | POA: Diagnosis not present

## 2017-12-08 DIAGNOSIS — I872 Venous insufficiency (chronic) (peripheral): Secondary | ICD-10-CM | POA: Diagnosis not present

## 2017-12-08 MED ORDER — TRIAMCINOLONE ACETONIDE 0.1 % EX CREA: 1.0000 "application " | TOPICAL_CREAM | Freq: Two times a day (BID) | CUTANEOUS | 2 refills | Status: DC

## 2017-12-08 MED ORDER — CEPHALEXIN 500 MG PO CAPS: 500.0000 mg | ORAL_CAPSULE | Freq: Four times a day (QID) | ORAL | 1 refills | Status: DC

## 2017-12-08 NOTE — Progress Notes (Signed)
   Subjective:    Patient ID: Ebony RosalesClaudia S Ashley, female    DOB: Mar 26, 1945, 72 y.o.   MRN: [id number]  HPI 72 year old Female for  Follow up of left leg tenderness.  Presented initially December 3 with left leg pain.  Left lower leg below her knee along the medial aspect was very firm and tender.  There was increased warmth in the left lower leg as well.  Doppler study showed no evidence of DVT.     Review of Systems no new complaints     Objective:   Physical Exam Left lower leg is less tender.  The prominence that was there previously on initial visit has decreased.  Decreased warmth as well but still erythema.       Assessment & Plan:  Stasis dermatitis-triamcinolone cream prescribed  Cellulitis left lower extremity-Keflex 500 mg 4 times a day for 7 days with 1 refill.  If not better in 7 days have prescription refilled  Plan: DVT has been ruled out.  Try triamcinolone cream 0.1% 3 times a day to see if redness and tenderness will improve.

## 2017-12-12 DIAGNOSIS — H2511 Age-related nuclear cataract, right eye: Secondary | ICD-10-CM | POA: Diagnosis not present

## 2017-12-12 DIAGNOSIS — H35033 Hypertensive retinopathy, bilateral: Secondary | ICD-10-CM | POA: Diagnosis not present

## 2017-12-12 DIAGNOSIS — H25812 Combined forms of age-related cataract, left eye: Secondary | ICD-10-CM | POA: Diagnosis not present

## 2017-12-12 DIAGNOSIS — H35372 Puckering of macula, left eye: Secondary | ICD-10-CM | POA: Diagnosis not present

## 2017-12-13 ENCOUNTER — Other Ambulatory Visit: Payer: Self-pay

## 2017-12-13 MED ORDER — ROSUVASTATIN CALCIUM 5 MG PO TABS: 5.0000 mg | ORAL_TABLET | ORAL | 11 refills | Status: DC

## 2017-12-13 NOTE — Telephone Encounter (Signed)
Patient called states you offered her a medical device/ fall alarm during her visit last time was here and she declined it but now she has changed her mind and her and her son thinks she needs this device. She said can we order it? She said if she needs to come in for an OV she will.

## 2017-12-13 NOTE — Telephone Encounter (Signed)
Spoke with patient and advised that she can order this online or she can take Rx to [facility] on [address] and shop for this.  Also advised there are other products that she/son can look into online that will provide reassurance for her and family as well.  Life Alert and other services can be placed on the patient and in the home and contact family and call 911.  Sending patient information on these and similar services.    Also patient agreed to go on Crestor 5mg  on Monday, Wednesday, Friday with supper.  Araceli is sending to patient's pharmacy.    Made Dr. Lenord FellersBaxley aware of this information.

## 2017-12-13 NOTE — Telephone Encounter (Signed)
I can give her a script for it but do not know how to order it. Her family will need to check into ordering it. Probably on internet or she can call [facility] to see what to do.  Also, I need for her to take Crestor 5 mg  3 times a week for elevated lipids.Will she do this?

## 2017-12-26 ENCOUNTER — Ambulatory Visit: Payer: Medicare Other | Admitting: Internal Medicine

## 2017-12-26 ENCOUNTER — Telehealth: Payer: Self-pay | Admitting: Internal Medicine

## 2017-12-26 ENCOUNTER — Encounter: Payer: Self-pay | Admitting: Internal Medicine

## 2017-12-26 VITALS — BP 130/90 | HR 60 | Temp 98.2°F | Ht 62.5 in | Wt 266.0 lb

## 2017-12-26 DIAGNOSIS — I872 Venous insufficiency (chronic) (peripheral): Secondary | ICD-10-CM

## 2017-12-26 DIAGNOSIS — L03116 Cellulitis of left lower limb: Secondary | ICD-10-CM

## 2017-12-26 MED ORDER — DOXYCYCLINE HYCLATE 100 MG PO TABS: 100.0000 mg | ORAL_TABLET | Freq: Two times a day (BID) | ORAL | 0 refills | Status: DC

## 2017-12-26 NOTE — Telephone Encounter (Signed)
Says the bottom of her leg is still a little tender.  She finished her antibiotic yesterday.  It is no where near as bad as when she came in originally.    Spoke with Dr. Lenord FellersBaxley and she would like for patient to come in for an OV.    Spoke with patient and gave her an appointment for today at 4:15.  Patient confirmed.

## 2017-12-26 NOTE — Progress Notes (Signed)
   Subjective:    Patient ID: Ebony RosalesClaudia S Ashley, female    DOB: Jun 10, 1945, 72 y.o.   MRN: [id number]  HPI 72 year old Female said she had finished course of Keflex previously prescribed and noticed that her leg was not completely well.  Still had some erythema and slight tenderness but was much improved.  We advised office visit.  We had previously ruled out DVT.  He has also been using triamcinolone cream topically on the area.    Review of Systems see above no fever or chills     Objective:   Physical Exam  She still has some persistent macular erythema left leg without any draining lesions.  There is slight increased warmth to the area but overall it has improved.      Assessment & Plan:  Cellulitis left lower leg-slowly improving, DVT previously ruled out  Plan: Doxycycline 100 mg twice daily for 10 days.

## 2017-12-31 NOTE — Patient Instructions (Signed)
Doxycycline 100 mg twice daily for 10 days

## 2017-12-31 NOTE — Patient Instructions (Signed)
Triamcinolone cream to left lower extremity 3 times a day.  Keflex 500 mg 4 times a day for 7 days with 1 refill.  If not significantly improved in 7 days have prescription refilled

## 2018-01-12 ENCOUNTER — Other Ambulatory Visit: Payer: Self-pay | Admitting: Internal Medicine

## 2018-01-12 DIAGNOSIS — I10 Essential (primary) hypertension: Secondary | ICD-10-CM

## 2018-01-28 ENCOUNTER — Other Ambulatory Visit: Payer: Self-pay | Admitting: Internal Medicine

## 2018-01-28 DIAGNOSIS — I10 Essential (primary) hypertension: Secondary | ICD-10-CM

## 2018-02-07 ENCOUNTER — Encounter: Payer: Self-pay | Admitting: Internal Medicine

## 2018-02-07 DIAGNOSIS — H35372 Puckering of macula, left eye: Secondary | ICD-10-CM | POA: Diagnosis not present

## 2018-02-07 DIAGNOSIS — H2511 Age-related nuclear cataract, right eye: Secondary | ICD-10-CM | POA: Diagnosis not present

## 2018-02-07 DIAGNOSIS — H25812 Combined forms of age-related cataract, left eye: Secondary | ICD-10-CM | POA: Diagnosis not present

## 2018-02-07 DIAGNOSIS — H35033 Hypertensive retinopathy, bilateral: Secondary | ICD-10-CM | POA: Diagnosis not present

## 2018-02-07 DIAGNOSIS — H04123 Dry eye syndrome of bilateral lacrimal glands: Secondary | ICD-10-CM | POA: Diagnosis not present

## 2018-02-07 LAB — HM DIABETES EYE EXAM

## 2018-02-08 ENCOUNTER — Encounter: Payer: Self-pay | Admitting: Internal Medicine

## 2018-02-09 ENCOUNTER — Telehealth: Payer: Self-pay

## 2018-02-09 ENCOUNTER — Encounter: Payer: Self-pay | Admitting: Internal Medicine

## 2018-02-09 ENCOUNTER — Ambulatory Visit (INDEPENDENT_AMBULATORY_CARE_PROVIDER_SITE_OTHER): Payer: Medicare Other | Admitting: Internal Medicine

## 2018-02-09 VITALS — BP 110/80 | HR 62 | Temp 98.3°F | Ht 62.5 in | Wt 260.0 lb

## 2018-02-09 DIAGNOSIS — N3941 Urge incontinence: Secondary | ICD-10-CM

## 2018-02-09 DIAGNOSIS — R350 Frequency of micturition: Secondary | ICD-10-CM

## 2018-02-09 DIAGNOSIS — M151 Heberden's nodes (with arthropathy): Secondary | ICD-10-CM

## 2018-02-09 LAB — POCT URINALYSIS DIPSTICK
Appearance: NEGATIVE
Bilirubin, UA: NEGATIVE
Blood, UA: NEGATIVE
Glucose, UA: NEGATIVE
Ketones, UA: NEGATIVE
Leukocytes, UA: NEGATIVE
Nitrite, UA: NEGATIVE
Odor: NEGATIVE
Protein, UA: NEGATIVE
Spec Grav, UA: 1.01 (ref 1.010–1.025)
Urobilinogen, UA: 0.2 E.U./dL
pH, UA: 6.5 (ref 5.0–8.0)

## 2018-02-09 MED ORDER — TRAMADOL HCL 50 MG PO TABS: 50.0000 mg | ORAL_TABLET | Freq: Three times a day (TID) | ORAL | 0 refills | Status: DC | PRN

## 2018-02-09 NOTE — Telephone Encounter (Signed)
Tramadol  was e scribed

## 2018-02-09 NOTE — Telephone Encounter (Signed)
Patient called to let you know that she is taking tramadol daily.

## 2018-02-14 ENCOUNTER — Other Ambulatory Visit: Payer: Self-pay | Admitting: Internal Medicine

## 2018-02-14 DIAGNOSIS — E118 Type 2 diabetes mellitus with unspecified complications: Secondary | ICD-10-CM

## 2018-02-21 ENCOUNTER — Other Ambulatory Visit: Payer: Medicare Other | Admitting: Internal Medicine

## 2018-02-21 NOTE — Addendum Note (Signed)
Addended by: Gregery Na on: 02/21/2018 09:45 AM   Modules accepted: Orders

## 2018-02-22 DIAGNOSIS — H35033 Hypertensive retinopathy, bilateral: Secondary | ICD-10-CM | POA: Diagnosis not present

## 2018-02-22 DIAGNOSIS — H35372 Puckering of macula, left eye: Secondary | ICD-10-CM | POA: Diagnosis not present

## 2018-02-22 DIAGNOSIS — H25812 Combined forms of age-related cataract, left eye: Secondary | ICD-10-CM | POA: Diagnosis not present

## 2018-02-22 DIAGNOSIS — H2511 Age-related nuclear cataract, right eye: Secondary | ICD-10-CM | POA: Diagnosis not present

## 2018-02-23 ENCOUNTER — Encounter: Payer: Medicare Other | Admitting: Internal Medicine

## 2018-03-04 NOTE — Progress Notes (Signed)
   Subjective:    Patient ID: Ebony Ashley, female    DOB: [birth year], 73 y.o.   MRN: [id number]  HPI Here today to discuss finger deformity.  Index finger has bony deformity DIP joint consistent with Heberden node associated with osteoarthritis.  She thinks it might be a cyst.  Reminded her this was osteoarthritis and there is not a good treatment for it. She says it is tender.  In December had issues with cellulitis of the left lower extremity that has slowly improved.  History of diabetes mellitus treated with Januvia and metformin  History of hypertension  Musculoskeletal pain  History of urge urinary incontinence and has been tried on Ditropan  History of GE reflux and has been treated with PPI  Is on Crestor 3 times a week for hyperlipidemia  Lost her husband a few months ago and has been lonely Review of Systems     Objective:   Physical Exam Blood pressure stable at 110/80  Painful Heberden's nodes index finger right hand     Assessment & Plan:  Heberden's node due to osteoarthritis that is painful  Diabetes mellitus  Hypertension  Hyperlipidemia  Metabolic syndrome  Obesity  GE reflux  Urge urinary incontinence treated with different pain which has not worked  Anxiety depression and grief reaction from loss of husband  Plan: I have prescribed tramadol for her to take sparingly for pain.  Continue with Ditropan and may need to see urologist regarding urinary issues.  Has urge urinary incontinence.

## 2018-03-04 NOTE — Patient Instructions (Signed)
Tramadol sparingly for musculoskeletal pain.  Urology appointment for urge urinary incontinence.  Ditropan has not worked

## 2018-03-16 ENCOUNTER — Other Ambulatory Visit: Payer: Medicare Other | Admitting: Internal Medicine

## 2018-03-16 ENCOUNTER — Other Ambulatory Visit: Payer: Self-pay

## 2018-03-16 DIAGNOSIS — Z0000 Encounter for general adult medical examination without abnormal findings: Secondary | ICD-10-CM | POA: Diagnosis not present

## 2018-03-16 DIAGNOSIS — E119 Type 2 diabetes mellitus without complications: Secondary | ICD-10-CM | POA: Diagnosis not present

## 2018-03-16 DIAGNOSIS — I10 Essential (primary) hypertension: Secondary | ICD-10-CM

## 2018-03-17 LAB — CBC WITH DIFFERENTIAL/PLATELET
Absolute Monocytes: 1205 cells/uL — ABNORMAL HIGH (ref 200–950)
Basophils Absolute: 35 cells/uL (ref 0–200)
Basophils Relative: 0.3 %
Eosinophils Absolute: 70 cells/uL (ref 15–500)
Eosinophils Relative: 0.6 %
HCT: 46 % — ABNORMAL HIGH (ref 35.0–45.0)
Hemoglobin: 15 g/dL (ref 11.7–15.5)
Lymphs Abs: 1041 cells/uL (ref 850–3900)
MCH: 29 pg (ref 27.0–33.0)
MCHC: 32.6 g/dL (ref 32.0–36.0)
MCV: 88.8 fL (ref 80.0–100.0)
MPV: 12.2 fL (ref 7.5–12.5)
Monocytes Relative: 10.3 %
Neutro Abs: 9348 cells/uL — ABNORMAL HIGH (ref 1500–7800)
Neutrophils Relative %: 79.9 %
Platelets: 261 10*3/uL (ref 140–400)
RBC: 5.18 10*6/uL — ABNORMAL HIGH (ref 3.80–5.10)
RDW: 13.2 % (ref 11.0–15.0)
Total Lymphocyte: 8.9 %
WBC: 11.7 10*3/uL — ABNORMAL HIGH (ref 3.8–10.8)

## 2018-03-17 LAB — HEMOGLOBIN A1C
Hgb A1c MFr Bld: 8.1 % of total Hgb — ABNORMAL HIGH (ref <5.7)
Mean Plasma Glucose: 186 (calc)
eAG (mmol/L): 10.3 (calc)

## 2018-03-17 LAB — COMPLETE METABOLIC PANEL WITH GFR
AG Ratio: 1.6 (calc) (ref 1.0–2.5)
ALT: 11 U/L (ref 6–29)
AST: 13 U/L (ref 10–35)
Albumin: 4 g/dL (ref 3.6–5.1)
Alkaline phosphatase (APISO): 90 U/L (ref 37–153)
BUN: 11 mg/dL (ref 7–25)
CO2: 28 mmol/L (ref 20–32)
Calcium: 9.4 mg/dL (ref 8.6–10.4)
Chloride: 99 mmol/L (ref 98–110)
Creat: 0.78 mg/dL (ref 0.60–0.93)
GFR, Est African American: 87 mL/min/{1.73_m2} (ref >=60)
GFR, Est Non African American: 75 mL/min/{1.73_m2} (ref >=60)
Globulin: 2.5 g/dL (calc) (ref 1.9–3.7)
Glucose, Bld: 212 mg/dL — ABNORMAL HIGH (ref 65–99)
Potassium: 5 mmol/L (ref 3.5–5.3)
Sodium: 138 mmol/L (ref 135–146)
Total Bilirubin: 0.6 mg/dL (ref 0.2–1.2)
Total Protein: 6.5 g/dL (ref 6.1–8.1)

## 2018-03-17 LAB — LIPID PANEL
Cholesterol: 141 mg/dL (ref <200)
HDL: 49 mg/dL — ABNORMAL LOW (ref >=50)
LDL Cholesterol (Calc): 75 mg/dL (calc)
Non-HDL Cholesterol (Calc): 92 mg/dL (calc) (ref <130)
Total CHOL/HDL Ratio: 2.9 (calc) (ref <5.0)
Triglycerides: 91 mg/dL (ref <150)

## 2018-03-17 LAB — TSH: TSH: 1.51 mIU/L (ref 0.40–4.50)

## 2018-03-20 ENCOUNTER — Encounter: Payer: Self-pay | Admitting: Internal Medicine

## 2018-03-20 ENCOUNTER — Other Ambulatory Visit: Payer: Self-pay

## 2018-03-20 ENCOUNTER — Ambulatory Visit (INDEPENDENT_AMBULATORY_CARE_PROVIDER_SITE_OTHER): Payer: Medicare Other | Admitting: Internal Medicine

## 2018-03-20 VITALS — BP 120/88 | Ht 62.5 in

## 2018-03-20 DIAGNOSIS — N3941 Urge incontinence: Secondary | ICD-10-CM | POA: Diagnosis not present

## 2018-03-20 DIAGNOSIS — R8290 Unspecified abnormal findings in urine: Secondary | ICD-10-CM | POA: Diagnosis not present

## 2018-03-20 DIAGNOSIS — K2100 Gastro-esophageal reflux disease with esophagitis, without bleeding: Secondary | ICD-10-CM

## 2018-03-20 DIAGNOSIS — F5102 Adjustment insomnia: Secondary | ICD-10-CM

## 2018-03-20 DIAGNOSIS — Z0000 Encounter for general adult medical examination without abnormal findings: Secondary | ICD-10-CM | POA: Diagnosis not present

## 2018-03-20 DIAGNOSIS — M151 Heberden's nodes (with arthropathy): Secondary | ICD-10-CM

## 2018-03-20 DIAGNOSIS — Z6841 Body Mass Index (BMI) 40.0 and over, adult: Secondary | ICD-10-CM

## 2018-03-20 DIAGNOSIS — R010 Benign and innocent cardiac murmurs: Secondary | ICD-10-CM

## 2018-03-20 DIAGNOSIS — F411 Generalized anxiety disorder: Secondary | ICD-10-CM

## 2018-03-20 DIAGNOSIS — I10 Essential (primary) hypertension: Secondary | ICD-10-CM

## 2018-03-20 DIAGNOSIS — I872 Venous insufficiency (chronic) (peripheral): Secondary | ICD-10-CM | POA: Diagnosis not present

## 2018-03-20 DIAGNOSIS — E1165 Type 2 diabetes mellitus with hyperglycemia: Secondary | ICD-10-CM

## 2018-03-20 LAB — POCT URINALYSIS DIPSTICK
Bilirubin, UA: NEGATIVE
Glucose, UA: NEGATIVE
Ketones, UA: NEGATIVE
Nitrite, UA: NEGATIVE
Protein, UA: NEGATIVE
Spec Grav, UA: 1.015 (ref 1.010–1.025)
Urobilinogen, UA: 0.2 E.U./dL
pH, UA: 6.5 (ref 5.0–8.0)

## 2018-03-20 NOTE — Progress Notes (Signed)
Subjective:    Patient ID: Ebony Ashley, female    DOB: 05/31/1945, 73 y.o.   MRN: [id number]  HPI 73 year old female in today for Medicare wellness, health maintenance exam and evaluation of medical issues.  She has abnormal urinalysis.  She is asymptomatic.  Culture will be sent.  History of type 2 diabetes mellitus, hypertension, esophageal spasm and esophagitis.  History of overactive bladder.  History of obesity.  Past medical history: Fractured left lower leg 1998.  Total abdominal hysterectomy without oophorectomy for menorrhagia in 1991.  Patient has had 2 D&C procedures in the past prior to her hysterectomy.  Colonoscopy 2009 by Dr. Loreta Ave.  Prevnar vaccine 2016.  Pneumococcal 23 vaccine August 2011.  Tdap 2010.  Tetanus immunization 2007.  Social history: She does not smoke.  Drinks wine cooler on vacation.  She graduated from [employer].  She formally worked at [employer].  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  She is married.  One adult daughter and one adult son.  Husband deceased.  Patient says Polysporin causes a rash and adhesive causes rash and irritation.  History of 2D echocardiogram 2009 showing mild left ventricular diastolic dysfunction this was done to evaluate murmur and advised Dr. Antoine Poche is not heard.  He thought it was probably septal hypertrophy.  2D echo did not show valvular stenosis.  Bone density study 2010 showed normal T-scores.  Family history: Mother died with colon cancer at age 86.  Father died at age 18 in an automobile accident.  Stepbrother died at age 68 of a stroke.  No sisters.    Review of Systems  Constitutional: Positive for fatigue.  HENT: Negative.   Cardiovascular: Positive for leg swelling. Negative for chest pain.  Gastrointestinal: Negative.   Musculoskeletal: Positive for arthralgias.  Neurological: Negative.    Psychiatric/Behavioral:       Anxious since husband died       Objective:   Physical Exam Vitals signs reviewed.  Constitutional:      General: She is not in acute distress.    Appearance: She is obese. She is not diaphoretic.  HENT:     Head: Normocephalic.     Right Ear: Tympanic membrane normal.     Left Ear: Tympanic membrane normal.     Mouth/Throat:     Pharynx: Oropharynx is clear.  Eyes:     General: No scleral icterus.    Extraocular Movements: Extraocular movements intact.     Conjunctiva/sclera: Conjunctivae normal.     Pupils: Pupils are equal, round, and reactive to light.  Neck:     Musculoskeletal: Neck supple. No neck rigidity.     Comments: No thyromegaly Cardiovascular:     Rate and Rhythm: Normal rate and regular rhythm.     Heart sounds: Murmur present.     Comments: 1/6 systolic ejection murmur heard along the upper left sternal border and radiates into both carotids Pulmonary:     Effort: No respiratory distress.     Breath sounds: Normal breath sounds.     Comments: Breast without masses Abdominal:     General: Bowel sounds are normal.     Palpations: There is no mass.     Tenderness: There is no abdominal tenderness. There is no guarding or rebound.  Genitourinary:    Comments: Bimanual normal. Skin:    General: Skin is warm and dry.  Neurological:  General: No focal deficit present.     Mental Status: She is alert.  Psychiatric:        Mood and Affect: Mood normal.        Behavior: Behavior normal.        Thought Content: Thought content normal.        Judgment: Judgment normal.           Assessment & Plan:  Essential hypertension-stable on current regimen  Poorly controlled type 2 diabetes mellitus-hemoglobin A1c 8.1%.  Needs to get better control of diabetes.  Started on Januvia with follow-up in 6 to 8 weeks.  Continue metformin.  History of GE reflux and esophagitis treated with PPI  Obesity- not really motivated to diet  and exercise  History of innocent cardiac murmur-stable  Urge urinary incontinence treated with Ditropan- not getting better and may need to see urologist  Family history of colon cancer-need for colonoscopy.  Option is Cologuard-information given  Abnormal urine dipstick specimen.  Culture sent  Subjective:   Patient presents for Medicare Annual/Subsequent preventive examination.  Review Past Medical/Family/Social: See above   Risk Factors  Current exercise habits: Sedentary Dietary issues discussed: Discussed low-fat low carbohydrate  Cardiac risk factors: Diabetes mellitus, stroke and stepbrother  Depression Screen  (Note: if answer to either of the following is "Yes", a more complete depression screening is indicated)   Over the past two weeks, have you felt down, depressed or hopeless? No  Over the past two weeks, have you felt little interest or pleasure in doing things? No Have you lost interest or pleasure in daily life? No Do you often feel hopeless? No Do you cry easily over simple problems? No   Activities of Daily Living  In your present state of health, do you have any difficulty performing the following activities?:   Driving? No  Managing money? No  Feeding yourself? No  Getting from bed to chair? No  Climbing a flight of stairs?  Hard for me due to arthritis preparing food and eating?: No  Bathing or showering? No  Getting dressed: No  Getting to the toilet? No  1 time my legs gave way Using the toilet:No except one time my legs gave way Moving around from place to place: No  In the past year have you fallen or had a near fall?:No  Are you sexually active? No  Do you have more than one partner? No   Hearing Difficulties: No  Do you often ask people to speak up or repeat themselves? No  Do you experience ringing or noises in your ears? No  Do you have difficulty understanding soft or whispered voices? No  Do you feel that you have a problem with  memory? No Do you often misplace items? yes   Home Safety:  Do you have a smoke alarm at your residence? Yes Do you have grab bars in the bathroom?  None Do you have throw rugs in your house?  In one bathroom   Cognitive Testing  Alert? Yes Normal Appearance?Yes  Oriented to person? Yes Place? Yes  Time? Yes  Recall of three objects? Yes  Can perform simple calculations? Yes  Displays appropriate judgment?Yes  Can read the correct time from a watch face?Yes   List the Names of Other Physician/Practitioners you currently use:  See referral list for the physicians patient is currently seeing.     Review of Systems: See above   Objective:     General appearance: Appears  stated age and  obese  Head: Normocephalic, without obvious abnormality, atraumatic  Eyes: conj clear, EOMi PEERLA  Ears: normal TM's and external ear canals both ears  Nose: Nares normal. Septum midline. Mucosa normal. No drainage or sinus tenderness.  Throat: lips, mucosa, and tongue normal; teeth and gums normal  Neck: no adenopathy, murmur transmitted into carotids bilaterally, no JVD, supple, symmetrical, trachea midline and thyroid not enlarged, symmetric, no tenderness/mass/nodules  No CVA tenderness.  Lungs: clear to auscultation bilaterally  Breasts: normal appearance, no masses or tenderness, Heart: regular rate and rhythm, S1, S2 normal,  murmur unchanged, click, rub or gallop  Abdomen: soft, non-tender; bowel sounds normal; no masses, no organomegaly  Musculoskeletal: ROM normal in all joints, no crepitus, no deformity, Normal muscle strengthen. Back  is symmetric, no curvature. Skin: Skin color, texture, turgor normal. No rashes or lesions  Lymph nodes: Cervical, supraclavicular, and axillary nodes normal.  Neurologic: CN 2 -12 Normal, Normal symmetric reflexes. Normal coordination and gait  Psych: Alert & Oriented x 3, Mood appear stable.    Assessment:    Annual wellness medicare exam    Plan:    During the course of the visit the patient was educated and counseled about appropriate screening and preventive services including:   Annual flu vaccine     Patient Instructions (the written plan) was given to the patient.  Medicare Attestation  I have personally reviewed:  The patient's medical and social history  Their use of alcohol, tobacco or illicit drugs  Their current medications and supplements  The patient's functional ability including ADLs,fall risks, home safety risks, cognitive, and hearing and visual impairment  Diet and physical activities  Evidence for depression or mood disorders  The patient's weight, height, BMI, and visual acuity have been recorded in the chart. I have made referrals, counseling, and provided education to the patient based on review of the above and I have provided the patient with a written personalized care plan for preventive services.

## 2018-03-22 ENCOUNTER — Other Ambulatory Visit: Payer: Self-pay

## 2018-03-22 LAB — URINE CULTURE
MICRO NUMBER:: 323314
SPECIMEN QUALITY:: ADEQUATE

## 2018-03-22 MED ORDER — NITROFURANTOIN MONOHYD MACRO 100 MG PO CAPS: 100.0000 mg | ORAL_CAPSULE | Freq: Two times a day (BID) | ORAL | 0 refills | Status: DC

## 2018-04-03 ENCOUNTER — Other Ambulatory Visit: Payer: Self-pay

## 2018-04-03 ENCOUNTER — Ambulatory Visit (INDEPENDENT_AMBULATORY_CARE_PROVIDER_SITE_OTHER): Payer: Medicare Other | Admitting: Internal Medicine

## 2018-04-03 ENCOUNTER — Encounter: Payer: Self-pay | Admitting: Internal Medicine

## 2018-04-03 VITALS — BP 120/80 | HR 84 | Temp 98.3°F | Ht 62.5 in | Wt 260.0 lb

## 2018-04-03 DIAGNOSIS — N390 Urinary tract infection, site not specified: Secondary | ICD-10-CM

## 2018-04-03 DIAGNOSIS — R8290 Unspecified abnormal findings in urine: Secondary | ICD-10-CM | POA: Diagnosis not present

## 2018-04-03 DIAGNOSIS — B9620 Unspecified Escherichia coli [E. coli] as the cause of diseases classified elsewhere: Secondary | ICD-10-CM

## 2018-04-03 LAB — POCT URINALYSIS DIPSTICK
Appearance: NEGATIVE
Bilirubin, UA: NEGATIVE
Glucose, UA: NEGATIVE
Ketones, UA: NEGATIVE
Nitrite, UA: NEGATIVE
Odor: NEGATIVE
Protein, UA: NEGATIVE
Spec Grav, UA: 1.01 (ref 1.010–1.025)
Urobilinogen, UA: 0.2 E.U./dL
pH, UA: 6.5 (ref 5.0–8.0)

## 2018-04-03 NOTE — Patient Instructions (Signed)
Recent E. coli UTI treated with antibiotics.  Here today for recheck.  Still has abnormal urine specimen.  Reculture urine.

## 2018-04-03 NOTE — Progress Notes (Signed)
Seen today in follow-up of asymptomatic E. coli UTI which was detected at time of her physical on March 16.  Repeat urine specimen today obtained by clean-catch is still abnormal.  Culture will be sent and have ordered microscopic urine specimen to be done as she has both LE and occult blood on dipstick.  She is completely asymptomatic.  Further instructions to follow once we  obtain these results

## 2018-04-04 ENCOUNTER — Encounter: Payer: Self-pay | Admitting: Internal Medicine

## 2018-04-04 LAB — URINALYSIS, MICROSCOPIC ONLY
Bacteria, UA: NONE SEEN /HPF
Hyaline Cast: NONE SEEN /LPF

## 2018-04-04 LAB — URINE CULTURE
MICRO NUMBER:: 362281
SPECIMEN QUALITY:: ADEQUATE

## 2018-04-04 NOTE — Patient Instructions (Signed)
Add Januvia to Metformin and follow-up in 6 weeks.  Otherwise continue same medications.  Watch diet.

## 2018-05-23 ENCOUNTER — Ambulatory Visit: Payer: Medicare Other | Admitting: Certified Nurse Midwife

## 2018-05-27 ENCOUNTER — Other Ambulatory Visit: Payer: Self-pay | Admitting: Internal Medicine

## 2018-05-31 ENCOUNTER — Other Ambulatory Visit: Payer: Medicare Other

## 2018-06-13 ENCOUNTER — Other Ambulatory Visit: Payer: Self-pay

## 2018-06-13 ENCOUNTER — Ambulatory Visit
Admission: RE | Admit: 2018-06-13 | Discharge: 2018-06-13 | Disposition: A | Discharge status: Home / Self Care | Payer: Medicare Other | Source: Ambulatory Visit | Attending: Certified Nurse Midwife | Admitting: Certified Nurse Midwife

## 2018-06-13 DIAGNOSIS — N6320 Unspecified lump in the left breast, unspecified quadrant: Secondary | ICD-10-CM

## 2018-06-13 DIAGNOSIS — R922 Inconclusive mammogram: Secondary | ICD-10-CM | POA: Diagnosis not present

## 2018-06-13 DIAGNOSIS — N6322 Unspecified lump in the left breast, upper inner quadrant: Secondary | ICD-10-CM | POA: Diagnosis not present

## 2018-07-21 ENCOUNTER — Other Ambulatory Visit: Payer: Self-pay | Admitting: Internal Medicine

## 2018-09-08 ENCOUNTER — Telehealth: Payer: Self-pay | Admitting: Internal Medicine

## 2018-09-08 ENCOUNTER — Other Ambulatory Visit: Payer: Self-pay | Admitting: Internal Medicine

## 2018-09-08 MED ORDER — TRAMADOL HCL 50 MG PO TABS: 50.0000 mg | ORAL_TABLET | Freq: Three times a day (TID) | ORAL | 0 refills | Status: DC | PRN

## 2018-09-08 NOTE — Telephone Encounter (Signed)
Refill Tramadol 50 mg #60

## 2018-09-18 ENCOUNTER — Telehealth: Payer: Self-pay | Admitting: Internal Medicine

## 2018-09-18 MED ORDER — ROSUVASTATIN CALCIUM 5 MG PO TABS: 5.0000 mg | ORAL_TABLET | ORAL | 0 refills | Status: DC

## 2018-09-18 NOTE — Telephone Encounter (Signed)
Received Fax RX request from  [address] [place], [place]  Medication - rosuvastatin (CRESTOR) 5 MG tablet    Last Refill - 06/25/18  Last OV - 04/03/18  Last CPE 03/20/18

## 2018-09-19 ENCOUNTER — Other Ambulatory Visit: Payer: Self-pay

## 2018-09-19 ENCOUNTER — Other Ambulatory Visit: Payer: Medicare Other | Admitting: Internal Medicine

## 2018-09-19 DIAGNOSIS — E786 Lipoprotein deficiency: Secondary | ICD-10-CM

## 2018-09-19 DIAGNOSIS — I10 Essential (primary) hypertension: Secondary | ICD-10-CM | POA: Diagnosis not present

## 2018-09-19 DIAGNOSIS — E1165 Type 2 diabetes mellitus with hyperglycemia: Secondary | ICD-10-CM

## 2018-09-20 LAB — HEPATIC FUNCTION PANEL
AG Ratio: 1.7 (calc) (ref 1.0–2.5)
ALT: 19 U/L (ref 6–29)
AST: 18 U/L (ref 10–35)
Albumin: 4.2 g/dL (ref 3.6–5.1)
Alkaline phosphatase (APISO): 98 U/L (ref 37–153)
Bilirubin, Direct: 0.1 mg/dL (ref 0.0–0.2)
Globulin: 2.5 g/dL (calc) (ref 1.9–3.7)
Indirect Bilirubin: 0.3 mg/dL (calc) (ref 0.2–1.2)
Total Bilirubin: 0.4 mg/dL (ref 0.2–1.2)
Total Protein: 6.7 g/dL (ref 6.1–8.1)

## 2018-09-20 LAB — MICROALBUMIN / CREATININE URINE RATIO
Creatinine, Urine: 60 mg/dL (ref 20–275)
Microalb Creat Ratio: 20 mcg/mg creat (ref <30)
Microalb, Ur: 1.2 mg/dL

## 2018-09-20 LAB — HEMOGLOBIN A1C
Hgb A1c MFr Bld: 8.1 % of total Hgb — ABNORMAL HIGH (ref <5.7)
Mean Plasma Glucose: 186 (calc)
eAG (mmol/L): 10.3 (calc)

## 2018-09-20 LAB — LIPID PANEL
Cholesterol: 174 mg/dL (ref <200)
HDL: 39 mg/dL — ABNORMAL LOW (ref >=50)
LDL Cholesterol (Calc): 106 mg/dL (calc) — ABNORMAL HIGH
Non-HDL Cholesterol (Calc): 135 mg/dL (calc) — ABNORMAL HIGH (ref <130)
Total CHOL/HDL Ratio: 4.5 (calc) (ref <5.0)
Triglycerides: 172 mg/dL — ABNORMAL HIGH (ref <150)

## 2018-09-21 ENCOUNTER — Other Ambulatory Visit: Payer: Self-pay

## 2018-09-21 ENCOUNTER — Ambulatory Visit (INDEPENDENT_AMBULATORY_CARE_PROVIDER_SITE_OTHER): Payer: Medicare Other | Admitting: Internal Medicine

## 2018-09-21 ENCOUNTER — Encounter: Payer: Self-pay | Admitting: Internal Medicine

## 2018-09-21 VITALS — BP 120/80 | HR 66 | Temp 98.0°F | Ht 62.5 in | Wt 260.0 lb

## 2018-09-21 DIAGNOSIS — F5102 Adjustment insomnia: Secondary | ICD-10-CM

## 2018-09-21 DIAGNOSIS — Z23 Encounter for immunization: Secondary | ICD-10-CM | POA: Diagnosis not present

## 2018-09-21 DIAGNOSIS — R350 Frequency of micturition: Secondary | ICD-10-CM | POA: Diagnosis not present

## 2018-09-21 DIAGNOSIS — I10 Essential (primary) hypertension: Secondary | ICD-10-CM

## 2018-09-21 DIAGNOSIS — N3941 Urge incontinence: Secondary | ICD-10-CM | POA: Diagnosis not present

## 2018-09-21 DIAGNOSIS — Z9181 History of falling: Secondary | ICD-10-CM

## 2018-09-21 DIAGNOSIS — Z6841 Body Mass Index (BMI) 40.0 and over, adult: Secondary | ICD-10-CM

## 2018-09-21 DIAGNOSIS — E1165 Type 2 diabetes mellitus with hyperglycemia: Secondary | ICD-10-CM | POA: Diagnosis not present

## 2018-09-21 NOTE — Patient Instructions (Addendum)
Wear Life Alert all the time. Flu vaccine given. Increase Januvia to 100 mg daily. See urologist about frequency. No sleep med prescribed.

## 2018-09-21 NOTE — Progress Notes (Signed)
   Subjective:    Patient ID: Ebony Ashley, female    DOB: 18-Oct-1945, 73 y.o.   MRN: [id number]  HPI 73 year old Female had recent fall in middle of night recently. Has not given key to any relative. Did not have Life Alert device on her person. Cell phone was not charged. Crawled to door and let neighbor in. Has abrasions on knees. Tetanus is up to date. Fall caused by getting up too quick out of bed. Foot slipped.  Needs to keep phone on her person at all times or have a life alert device.  Discussion about this today if she wants to remain at home alone.  Triglycerides have gone up from 91-1 72 and LDL has increased from 75-1 06.  May not be following strict diet.  Hemoglobin A1c 8.1% and was 8.1% in March and previously a year ago was 7.6%.  Does not want to see endocrinologist.  Increase Januvia to 100 mg daily and follow-up in 40months.   Flu vaccine given.  Needs to see urologist about urinary frequency.    Increase Januvia to 100 mg daily.    Due to her recent fall no sleep medication will be prescribed despite complaint of insomnia       Review of Systems see above     Objective:   Physical Exam Blood pressure 120/80, pulse 66 temperature 98 degrees orally pulse oximetry 93% BMI 46.80 weight 260 pounds.  Neck supple.  Chest clear.  No carotid bruits.  Cardiac exam regular rate and rhythm normal S1 and S2.  Trace pitting edema of the lower extremities.  No thyromegaly.  Affect and judgment normal.       Assessment & Plan:  Diabetes mellitus.  Increase Januvia to 100 mg daily and follow-up in November  Insomnia- no sleep medications provided as a result of recent fall  We she could lose some weight but BMI is 46.80.  Lives alone and is a fall risk-must get life alert device or keep him on current all times  Flu vaccine given  Urge urinary incontinence to see urologist  Follow-up in 2 months

## 2018-11-02 ENCOUNTER — Other Ambulatory Visit: Payer: Medicare Other | Admitting: Internal Medicine

## 2018-11-02 ENCOUNTER — Other Ambulatory Visit: Payer: Self-pay

## 2018-11-02 DIAGNOSIS — E781 Pure hyperglyceridemia: Secondary | ICD-10-CM

## 2018-11-02 DIAGNOSIS — E1165 Type 2 diabetes mellitus with hyperglycemia: Secondary | ICD-10-CM

## 2018-11-02 MED ORDER — SITAGLIPTIN PHOSPHATE 100 MG PO TABS: 100.0000 mg | ORAL_TABLET | Freq: Every day | ORAL | 1 refills | Status: DC

## 2018-11-02 NOTE — Addendum Note (Signed)
Addended by: Mady Haagensen on: 11/02/2018 09:38 AM   Modules accepted: Orders

## 2018-11-03 LAB — LIPID PANEL
Cholesterol: 139 mg/dL (ref <200)
HDL: 40 mg/dL — ABNORMAL LOW (ref >=50)
LDL Cholesterol (Calc): 83 mg/dL (calc)
Non-HDL Cholesterol (Calc): 99 mg/dL (calc) (ref <130)
Total CHOL/HDL Ratio: 3.5 (calc) (ref <5.0)
Triglycerides: 82 mg/dL (ref <150)

## 2018-11-03 LAB — HEMOGLOBIN A1C
Hgb A1c MFr Bld: 7.3 % of total Hgb — ABNORMAL HIGH (ref <5.7)
Mean Plasma Glucose: 163 (calc)
eAG (mmol/L): 9 (calc)

## 2018-11-06 ENCOUNTER — Other Ambulatory Visit: Payer: Self-pay

## 2018-11-06 ENCOUNTER — Encounter: Payer: Self-pay | Admitting: Internal Medicine

## 2018-11-06 ENCOUNTER — Ambulatory Visit (INDEPENDENT_AMBULATORY_CARE_PROVIDER_SITE_OTHER): Payer: Medicare Other | Admitting: Internal Medicine

## 2018-11-06 VITALS — BP 130/80 | HR 60 | Temp 98.0°F | Ht 62.5 in | Wt 260.0 lb

## 2018-11-06 DIAGNOSIS — I10 Essential (primary) hypertension: Secondary | ICD-10-CM | POA: Diagnosis not present

## 2018-11-06 DIAGNOSIS — Z6841 Body Mass Index (BMI) 40.0 and over, adult: Secondary | ICD-10-CM | POA: Diagnosis not present

## 2018-11-06 DIAGNOSIS — E1169 Type 2 diabetes mellitus with other specified complication: Secondary | ICD-10-CM | POA: Diagnosis not present

## 2018-11-06 DIAGNOSIS — E785 Hyperlipidemia, unspecified: Secondary | ICD-10-CM

## 2018-11-06 DIAGNOSIS — Z9181 History of falling: Secondary | ICD-10-CM | POA: Diagnosis not present

## 2018-11-06 MED ORDER — GLIPIZIDE ER 2.5 MG PO TB24: 2.5000 mg | ORAL_TABLET | Freq: Every day | ORAL | 0 refills | Status: DC

## 2018-11-06 NOTE — Patient Instructions (Signed)
Add glipizide 2.5 mg XL to Januvia and Glucophage.  Come in for hemoglobin A1c lab draw only in 6 weeks.  Schedule CPE for March.

## 2018-11-06 NOTE — Progress Notes (Signed)
   Subjective:    Patient ID: Ebony Ashley, female    DOB: 04/25/1945, 73 y.o.   MRN: [id number]  HPI 73 year old Female in today for follow-up on hemoglobin A1c and type 2 diabetes mellitus.  Has had no more falls at home. In September hemoglobin A1c was 8.1% and was 8.1% in March of this year.  Previously a year ago was 7.6%.  At last visit increase Januvia to 100 mg daily.  Hemoglobin A1c has improved to 7.3% she is also on Crestor 5 mg 3 times a week.  Triglycerides have improved from 172 to82.  LDL has improved from 106 to 83.  Total cholesterol has improved from 174 to 139.   Review of Systems no new complaints     Objective:   Physical Exam Vital signs reviewed.  Chest clear.  Cardiac exam regular rate and rhythm. Blood pressure 130/80, pulse 60, weight 260 pounds, BMI 46.80      Assessment & Plan:  Improved hemoglobin A1c with increasing Januvia  Hyperlipidemia-improved on statin  Type 2 diabetes mellitus  Morbid obesity-not able to exercise all that much  History of fall at home  Plan: Add glipizide XL 2.5 mg daily to Januvia 100 mg daily and Glucophage.  Have A1c drawn in 6 weeks.  Return for physical exam and fasting labs in March 2021.

## 2018-11-29 DIAGNOSIS — R350 Frequency of micturition: Secondary | ICD-10-CM | POA: Diagnosis not present

## 2018-12-02 ENCOUNTER — Other Ambulatory Visit: Payer: Self-pay | Admitting: Internal Medicine

## 2018-12-04 ENCOUNTER — Other Ambulatory Visit: Payer: Self-pay

## 2018-12-04 MED ORDER — OXYBUTYNIN CHLORIDE ER 10 MG PO TB24: 10.0000 mg | ORAL_TABLET | Freq: Every day | ORAL | 3 refills | Status: DC

## 2018-12-04 NOTE — Addendum Note (Signed)
Addended by: Mady Haagensen on: 12/04/2018 10:00 AM   Modules accepted: Orders

## 2018-12-18 DIAGNOSIS — H35372 Puckering of macula, left eye: Secondary | ICD-10-CM | POA: Diagnosis not present

## 2018-12-18 DIAGNOSIS — H35033 Hypertensive retinopathy, bilateral: Secondary | ICD-10-CM | POA: Diagnosis not present

## 2018-12-18 DIAGNOSIS — H2511 Age-related nuclear cataract, right eye: Secondary | ICD-10-CM | POA: Diagnosis not present

## 2018-12-18 DIAGNOSIS — H25812 Combined forms of age-related cataract, left eye: Secondary | ICD-10-CM | POA: Diagnosis not present

## 2018-12-19 ENCOUNTER — Other Ambulatory Visit: Payer: Medicare Other | Admitting: Internal Medicine

## 2018-12-21 ENCOUNTER — Other Ambulatory Visit: Payer: Self-pay | Admitting: Internal Medicine

## 2018-12-25 ENCOUNTER — Other Ambulatory Visit: Payer: Self-pay | Admitting: Internal Medicine

## 2018-12-27 ENCOUNTER — Other Ambulatory Visit: Payer: Self-pay

## 2018-12-27 MED ORDER — METFORMIN HCL 850 MG PO TABS: ORAL_TABLET | ORAL | 3 refills | Status: DC

## 2019-01-15 DIAGNOSIS — H2512 Age-related nuclear cataract, left eye: Secondary | ICD-10-CM | POA: Diagnosis not present

## 2019-01-16 ENCOUNTER — Other Ambulatory Visit: Payer: Self-pay

## 2019-01-16 ENCOUNTER — Other Ambulatory Visit: Payer: Medicare Other | Admitting: Internal Medicine

## 2019-01-16 DIAGNOSIS — E1165 Type 2 diabetes mellitus with hyperglycemia: Secondary | ICD-10-CM

## 2019-01-17 DIAGNOSIS — R350 Frequency of micturition: Secondary | ICD-10-CM | POA: Diagnosis not present

## 2019-01-17 LAB — HEMOGLOBIN A1C
Hgb A1c MFr Bld: 6.9 % of total Hgb — ABNORMAL HIGH (ref <5.7)
Mean Plasma Glucose: 151 (calc)
eAG (mmol/L): 8.4 (calc)

## 2019-01-23 ENCOUNTER — Ambulatory Visit: Payer: Medicare Other | Attending: Internal Medicine

## 2019-01-23 DIAGNOSIS — Z23 Encounter for immunization: Secondary | ICD-10-CM | POA: Insufficient documentation

## 2019-01-23 NOTE — Progress Notes (Signed)
   Covid-19 Vaccination Clinic  Name:  Ebony Ashley    MRN: [id number] DOB: Nov 11, 1945  01/23/2019  [name] was observed post Covid-19 immunization for 15 minutes without incidence. She was provided with Vaccine Information Sheet and instruction to access the V-Safe system.   [name] was instructed to call 911 with any severe reactions post vaccine: Marland Kitchen Difficulty breathing  . Swelling of your face and throat  . A fast heartbeat  . A bad rash all over your body  . Dizziness and weakness    Immunizations Administered    Name Date Dose VIS Date Route   Pfizer COVID-19 Vaccine 01/23/2019  1:05 PM 0.3 mL 12/15/2018 Intramuscular   Manufacturer: [employer], [employer]   Lot: [id number]   NDC: [address]-4

## 2019-01-24 ENCOUNTER — Other Ambulatory Visit: Payer: Self-pay | Admitting: Internal Medicine

## 2019-01-24 DIAGNOSIS — R350 Frequency of micturition: Secondary | ICD-10-CM | POA: Diagnosis not present

## 2019-01-30 DIAGNOSIS — H2512 Age-related nuclear cataract, left eye: Secondary | ICD-10-CM | POA: Diagnosis not present

## 2019-01-30 DIAGNOSIS — H25812 Combined forms of age-related cataract, left eye: Secondary | ICD-10-CM | POA: Diagnosis not present

## 2019-01-30 DIAGNOSIS — H268 Other specified cataract: Secondary | ICD-10-CM | POA: Diagnosis not present

## 2019-02-05 DIAGNOSIS — H2512 Age-related nuclear cataract, left eye: Secondary | ICD-10-CM | POA: Diagnosis not present

## 2019-02-12 ENCOUNTER — Ambulatory Visit: Payer: Medicare Other | Attending: Internal Medicine

## 2019-02-12 DIAGNOSIS — Z23 Encounter for immunization: Secondary | ICD-10-CM | POA: Insufficient documentation

## 2019-02-12 NOTE — Progress Notes (Signed)
   Covid-19 Vaccination Clinic  Name:  EMSLEE LOPEZMARTINEZ    MRN: [id number] DOB: [birth year]  02/12/2019  Ms. Nouri was observed post Covid-19 immunization for 15 minutes without incidence. She was provided with Vaccine Information Sheet and instruction to access the V-Safe system.   Ms. Mcgrory was instructed to call 911 with any severe reactions post vaccine: Marland Kitchen Difficulty breathing  . Swelling of your face and throat  . A fast heartbeat  . A bad rash all over your body  . Dizziness and weakness    Immunizations Administered    Name Date Dose VIS Date Route   Pfizer COVID-19 Vaccine 02/12/2019  1:45 PM 0.3 mL 12/15/2018 Intramuscular   Manufacturer: [employer], [employer]   Lot: [id number]   NDC: [address]-2

## 2019-02-20 ENCOUNTER — Other Ambulatory Visit: Payer: Self-pay | Admitting: Internal Medicine

## 2019-02-20 DIAGNOSIS — H25811 Combined forms of age-related cataract, right eye: Secondary | ICD-10-CM | POA: Diagnosis not present

## 2019-02-20 DIAGNOSIS — H2511 Age-related nuclear cataract, right eye: Secondary | ICD-10-CM | POA: Diagnosis not present

## 2019-03-19 NOTE — Addendum Note (Signed)
Addended by: Gregery Na on: 03/19/2019 04:54 PM   Modules accepted: Orders

## 2019-03-20 ENCOUNTER — Other Ambulatory Visit: Payer: Medicare Other | Admitting: Internal Medicine

## 2019-03-20 ENCOUNTER — Other Ambulatory Visit: Payer: Self-pay

## 2019-03-20 DIAGNOSIS — E1165 Type 2 diabetes mellitus with hyperglycemia: Secondary | ICD-10-CM | POA: Diagnosis not present

## 2019-03-20 DIAGNOSIS — I10 Essential (primary) hypertension: Secondary | ICD-10-CM

## 2019-03-20 DIAGNOSIS — Z1329 Encounter for screening for other suspected endocrine disorder: Secondary | ICD-10-CM

## 2019-03-20 DIAGNOSIS — E785 Hyperlipidemia, unspecified: Secondary | ICD-10-CM | POA: Diagnosis not present

## 2019-03-20 DIAGNOSIS — E1169 Type 2 diabetes mellitus with other specified complication: Secondary | ICD-10-CM | POA: Diagnosis not present

## 2019-03-20 DIAGNOSIS — M151 Heberden's nodes (with arthropathy): Secondary | ICD-10-CM

## 2019-03-20 DIAGNOSIS — Z0000 Encounter for general adult medical examination without abnormal findings: Secondary | ICD-10-CM

## 2019-03-21 LAB — CBC WITH DIFFERENTIAL/PLATELET
Absolute Monocytes: 648 cells/uL (ref 200–950)
Basophils Absolute: 72 cells/uL (ref 0–200)
Basophils Relative: 0.8 %
Eosinophils Absolute: 126 cells/uL (ref 15–500)
Eosinophils Relative: 1.4 %
HCT: 50.3 % — ABNORMAL HIGH (ref 35.0–45.0)
Hemoglobin: 16.5 g/dL — ABNORMAL HIGH (ref 11.7–15.5)
Lymphs Abs: 1467 cells/uL (ref 850–3900)
MCH: 29.6 pg (ref 27.0–33.0)
MCHC: 32.8 g/dL (ref 32.0–36.0)
MCV: 90.3 fL (ref 80.0–100.0)
MPV: 12.1 fL (ref 7.5–12.5)
Monocytes Relative: 7.2 %
Neutro Abs: 6687 cells/uL (ref 1500–7800)
Neutrophils Relative %: 74.3 %
Platelets: 284 10*3/uL (ref 140–400)
RBC: 5.57 10*6/uL — ABNORMAL HIGH (ref 3.80–5.10)
RDW: 13.2 % (ref 11.0–15.0)
Total Lymphocyte: 16.3 %
WBC: 9 10*3/uL (ref 3.8–10.8)

## 2019-03-21 LAB — COMPLETE METABOLIC PANEL WITH GFR
AG Ratio: 1.8 (calc) (ref 1.0–2.5)
ALT: 18 U/L (ref 6–29)
AST: 21 U/L (ref 10–35)
Albumin: 4.2 g/dL (ref 3.6–5.1)
Alkaline phosphatase (APISO): 136 U/L (ref 37–153)
BUN: 12 mg/dL (ref 7–25)
CO2: 33 mmol/L — ABNORMAL HIGH (ref 20–32)
Calcium: 9.6 mg/dL (ref 8.6–10.4)
Chloride: 101 mmol/L (ref 98–110)
Creat: 0.72 mg/dL (ref 0.60–0.93)
GFR, Est African American: 96 mL/min/{1.73_m2} (ref >=60)
GFR, Est Non African American: 82 mL/min/{1.73_m2} (ref >=60)
Globulin: 2.3 g/dL (calc) (ref 1.9–3.7)
Glucose, Bld: 167 mg/dL — ABNORMAL HIGH (ref 65–99)
Potassium: 4.9 mmol/L (ref 3.5–5.3)
Sodium: 140 mmol/L (ref 135–146)
Total Bilirubin: 0.6 mg/dL (ref 0.2–1.2)
Total Protein: 6.5 g/dL (ref 6.1–8.1)

## 2019-03-21 LAB — LIPID PANEL
Cholesterol: 139 mg/dL (ref <200)
HDL: 41 mg/dL — ABNORMAL LOW (ref >=50)
LDL Cholesterol (Calc): 79 mg/dL (calc)
Non-HDL Cholesterol (Calc): 98 mg/dL (calc) (ref <130)
Total CHOL/HDL Ratio: 3.4 (calc) (ref <5.0)
Triglycerides: 106 mg/dL (ref <150)

## 2019-03-21 LAB — TSH: TSH: 2.29 mIU/L (ref 0.40–4.50)

## 2019-03-21 LAB — HEMOGLOBIN A1C
Hgb A1c MFr Bld: 7.2 % of total Hgb — ABNORMAL HIGH (ref <5.7)
Mean Plasma Glucose: 160 (calc)
eAG (mmol/L): 8.9 (calc)

## 2019-03-22 ENCOUNTER — Encounter: Payer: Medicare Other | Admitting: Internal Medicine

## 2019-03-28 ENCOUNTER — Encounter: Payer: Self-pay | Admitting: Certified Nurse Midwife

## 2019-03-30 DIAGNOSIS — R350 Frequency of micturition: Secondary | ICD-10-CM | POA: Diagnosis not present

## 2019-04-10 ENCOUNTER — Ambulatory Visit (INDEPENDENT_AMBULATORY_CARE_PROVIDER_SITE_OTHER): Payer: Medicare Other | Admitting: Internal Medicine

## 2019-04-10 ENCOUNTER — Encounter: Payer: Self-pay | Admitting: Internal Medicine

## 2019-04-10 ENCOUNTER — Other Ambulatory Visit: Payer: Self-pay

## 2019-04-10 VITALS — BP 120/80 | HR 79 | Temp 98.2°F | Ht 62.5 in | Wt 264.0 lb

## 2019-04-10 DIAGNOSIS — I4891 Unspecified atrial fibrillation: Secondary | ICD-10-CM | POA: Diagnosis not present

## 2019-04-10 DIAGNOSIS — E1169 Type 2 diabetes mellitus with other specified complication: Secondary | ICD-10-CM | POA: Diagnosis not present

## 2019-04-10 DIAGNOSIS — I10 Essential (primary) hypertension: Secondary | ICD-10-CM | POA: Diagnosis not present

## 2019-04-10 DIAGNOSIS — Z0000 Encounter for general adult medical examination without abnormal findings: Secondary | ICD-10-CM | POA: Diagnosis not present

## 2019-04-10 DIAGNOSIS — Z6841 Body Mass Index (BMI) 40.0 and over, adult: Secondary | ICD-10-CM

## 2019-04-10 DIAGNOSIS — N3941 Urge incontinence: Secondary | ICD-10-CM

## 2019-04-10 DIAGNOSIS — E785 Hyperlipidemia, unspecified: Secondary | ICD-10-CM

## 2019-04-10 DIAGNOSIS — F411 Generalized anxiety disorder: Secondary | ICD-10-CM

## 2019-04-10 LAB — POCT URINALYSIS DIPSTICK
Appearance: NEGATIVE
Bilirubin, UA: NEGATIVE
Blood, UA: NEGATIVE
Clarity, UA: NEGATIVE
Glucose, UA: NEGATIVE
Ketones, UA: NEGATIVE
Leukocytes, UA: NEGATIVE
Nitrite, UA: NEGATIVE
Odor: NEGATIVE
Protein, UA: NEGATIVE
Spec Grav, UA: 1.015 (ref 1.010–1.025)
Urobilinogen, UA: 0.2 E.U./dL
pH, UA: 6.5 (ref 5.0–8.0)

## 2019-04-10 NOTE — Progress Notes (Signed)
Subjective:    Patient ID: Ebony Ashley, female    DOB: Jan 13, 1945, 74 y.o.   MRN: [id number]  HPI 74 year old Female for [facility], health maintenance exam, and evaluation of medical issues.  She has type 2 diabetes mellitus, hypertension, esophageal spasm and esophagitis.  History of obesity and overactive bladder.  Today, she was noted to have irregular irregular pulse and EKG showed atrial fibrillation which is new onset.  She will be referred to cardiology.  Past medical history: Fractured left lower leg 1998.  Total abdominal hysterectomy without oophorectomy for menorrhagia in 1991.  She has had 2 D&C procedures in the past prior to her hysterectomy.  Colonoscopy 2009 by Dr. Loreta Ave.  Hgb AIC 7.2%. Has been eating doughnuts.  New information: Left eye cataract extraction January 2021 and right eye cataract extraction February 2021  Has been seen at [facility] and is trying different medications. Has had recent UTIs treated by urologist. Had urodynamics. Dx with urge urinary incontinence.Failed Mybetriq. Now on Vesicare. Previously failed Ditropan which I prescribed.  She had bone density study 2010 showing normal T-scores.  She had 2D echocardiogram 2013.  Had mild LVH.  Systolic function was normal.  Left atrium was mildly dilated.  She was sent to cardiologist in 2013 to evaluate murmur.  Dr. Antoine Poche noted an apical systolic murmur.  Social: She does not smoke.  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  She is now retired.  1 adult daughter who lives out of state and 1 adult son.  Husband deceased.  Family history: Mother died with colon cancer at age 18.  Father died at age 41 and an automobile accident.  Stepbrother died at age 49 of a stroke.  No sisters.    Review of Systems  Constitutional: Negative.   HENT: Negative.     Respiratory: Negative.   Cardiovascular: Negative for chest pain and palpitations.  Gastrointestinal: Negative.   Genitourinary:       Overactive bladder seen by urology  Neurological: Negative.   Psychiatric/Behavioral:       Mildly depressed since husband died       Objective:   Physical Exam  Gained 4 pounds since November 2020. Blood pressure 120/80 pulse 79 irregular BMI 47.52 temperature 98.2 degrees orally pulse oximetry 96% weight 264 pounds  Skin warm and dry.  Nodes none.  TMs are clear.  Neck is supple.  No thyromegaly.  No carotid bruits.  Chest is clear to auscultation.  Cardiac exam: Irregular irregular rhythm noted today.  This is new.  Abdomen soft nondistended obese without hepatosplenomegaly masses or tenderness.  Trace lower extremity edema nonpitting.  Neuro no gross focal deficits on brief neurological exam.     Assessment & Plan:  New onset atrial fibrillation-will be referred to cardiology for evaluation and recommendations  History of systolic ejection murmur with  2D echocardiogram 2013 showing LVH and mildly dilated left atrium.  Type 2 diabetes mellitus-she needs to watch her diet.  Does not get a lot of exercise and is sedentary.  Continue current medications.  Follow-up in 6 months.  Currently on Januvia, Metformin and glipizide  Fall risk -lives alone-now has a device to wear around her neck in case she falls.  She had an episode at home where she could not get up a while back after a fall and I recommended  she get this device.  Essential hypertension-stable on current regimen of amlodipine and metoprolol  Anxiety state treated with Xanax at bedtime as needed.  She lives alone now that husband is deceased.  History of GE reflux treated with generic Nexium  Overactive bladder currently being seen by urology  Morbid obesity-not motivated to diet and exercise  Family history of colon cancer-recommend Cologuard.  Has not wanted to have  colonoscopy.  History of mixed hyperlipidemia currently being treated with Crestor 5 mg 3 times a week  Plan: Referral to cardiology for evaluation of atrial fibrillation.  Continue diet and exercise efforts.  Continue current medications.  Follow-up here in 6 months.  Subjective:   Patient presents for Medicare Annual/Subsequent preventive examination.  Review Past Medical/Family/Social: See above   Risk Factors  Current exercise habits: Sedentary Dietary issues discussed: Low-fat low carbohydrate  Cardiac risk factors: Morbid obesity, hyperlipidemia, diabetes, stroke in stepbrother  Depression Screen  (Note: if answer to either of the following is "Yes", a more complete depression screening is indicated)   Over the past two weeks, have you felt down, depressed or hopeless? No  Over the past two weeks, have you felt little interest or pleasure in doing things? No Have you lost interest or pleasure in daily life? No Do you often feel hopeless? No Do you cry easily over simple problems? No   Activities of Daily Living  In your present state of health, do you have any difficulty performing the following activities?:   Driving? No  Managing money? No  Feeding yourself? No  Getting from bed to chair? No  Climbing a flight of stairs? No  Preparing food and eating?: No  Bathing or showering? No  Getting dressed: No  Getting to the toilet? No  Using the toilet:No  Moving around from place to place: No  In the past year have you fallen or had a near fall?:  Yes 1 Are you sexually active? No  Do you have more than one partner? No   Hearing Difficulties: No  Do you often ask people to speak up or repeat themselves? No  Do you experience ringing or noises in your ears? No  Do you have difficulty understanding soft or whispered voices? No  Do you feel that you have a problem with memory? No Do you often misplace items? No    Home Safety:  Do you have a smoke alarm at your  residence? Yes Do you have grab bars in the bathroom?  None Do you have throw rugs in your house?  None   Cognitive Testing  Alert? Yes Normal Appearance?Yes  Oriented to person? Yes Place? Yes  Time? Yes  Recall of three objects?  Not tested Can perform simple calculations? Yes  Displays appropriate judgment?Yes  Can read the correct time from a watch face?Yes   List the Names of Other Physician/Practitioners you currently use:  See referral list for the physicians patient is currently seeing.     Review of Systems: See above   Objective:     General appearance: Appears stated age and morbidly obese  Head: Normocephalic, without obvious abnormality, atraumatic  Eyes: conj clear, EOMi PEERLA  Ears: normal TM's and external ear canals both ears  Nose: Nares normal. Septum midline. Mucosa normal. No drainage or sinus tenderness.  Throat: lips, mucosa, and tongue normal; teeth and gums normal  Neck: no adenopathy, no carotid bruit, no JVD, supple, symmetrical, trachea midline and thyroid not enlarged, symmetric, no tenderness/mass/nodules  No CVA tenderness.  Lungs: clear to auscultation bilaterally  Breasts: normal appearance, no masses or tenderness Heart: Irregular irregular rhythm Abdomen: soft, non-tender; bowel sounds normal; no masses, no organomegaly  Musculoskeletal: ROM normal in all joints, no crepitus, no deformity, Normal muscle strengthen. Back  is symmetric, no curvature. Skin: Skin color, texture, turgor normal. No rashes or lesions  Lymph nodes: Cervical, supraclavicular, and axillary nodes normal.  Neurologic: CN 2 -12 Normal, Normal symmetric reflexes. Normal coordination and gait  Psych: Alert & Oriented x 3, Mood appear stable.    Assessment:    Annual wellness medicare exam   Plan:    During the course of the visit the patient was educated and counseled about appropriate screening and preventive services including:   Has had 2 COVID-19 vaccines.   Pneumococcal vaccines up-to-date.  Tetanus immunization up-to-date.     Patient Instructions (the written plan) was given to the patient.  Medicare Attestation  I have personally reviewed:  The patient's medical and social history  Their use of alcohol, tobacco or illicit drugs  Their current medications and supplements  The patient's functional ability including ADLs,fall risks, home safety risks, cognitive, and hearing and visual impairment  Diet and physical activities  Evidence for depression or mood disorders  The patient's weight, height, BMI, and visual acuity have been recorded in the chart. I have made referrals, counseling, and provided education to the patient based on review of the above and I have provided the patient with a written personalized care plan for preventive services.

## 2019-04-18 ENCOUNTER — Other Ambulatory Visit: Payer: Self-pay | Admitting: Internal Medicine

## 2019-04-20 ENCOUNTER — Ambulatory Visit: Payer: Medicare Other | Admitting: Cardiology

## 2019-04-21 ENCOUNTER — Encounter: Payer: Self-pay | Admitting: Internal Medicine

## 2019-04-21 NOTE — Patient Instructions (Signed)
It was a pleasure to see you today.  You have new onset atrial fibrillation and will be referred to cardiology for evaluation.  Continue current medications previously prescribed and follow-up here in 6 months.  Cologuard will be ordered for colon cancer screening.  Please continue diet and exercise efforts.

## 2019-04-24 ENCOUNTER — Other Ambulatory Visit: Payer: Self-pay | Admitting: Internal Medicine

## 2019-04-25 DIAGNOSIS — Z961 Presence of intraocular lens: Secondary | ICD-10-CM | POA: Diagnosis not present

## 2019-04-30 ENCOUNTER — Other Ambulatory Visit: Payer: Self-pay

## 2019-04-30 DIAGNOSIS — Z1211 Encounter for screening for malignant neoplasm of colon: Secondary | ICD-10-CM

## 2019-05-02 ENCOUNTER — Encounter: Payer: Self-pay | Admitting: Internal Medicine

## 2019-05-02 ENCOUNTER — Other Ambulatory Visit: Payer: Self-pay | Admitting: Internal Medicine

## 2019-05-07 ENCOUNTER — Telehealth: Payer: Self-pay | Admitting: Internal Medicine

## 2019-05-07 DIAGNOSIS — Z7189 Other specified counseling: Secondary | ICD-10-CM | POA: Insufficient documentation

## 2019-05-07 DIAGNOSIS — I10 Essential (primary) hypertension: Secondary | ICD-10-CM

## 2019-05-07 DIAGNOSIS — I517 Cardiomegaly: Secondary | ICD-10-CM | POA: Insufficient documentation

## 2019-05-07 DIAGNOSIS — I4891 Unspecified atrial fibrillation: Secondary | ICD-10-CM | POA: Insufficient documentation

## 2019-05-07 MED ORDER — METOPROLOL SUCCINATE ER 100 MG PO TB24: ORAL_TABLET | ORAL | 3 refills | Status: DC

## 2019-05-07 NOTE — Telephone Encounter (Signed)
Received Fax RX request from  Pharmacy -  [facility] [phone] [place], [place] - [address] Phone:  [phone]  Fax:  [phone]       Medication - metoprolol succinate (TOPROL-XL) 100 MG 24 hr tablet   Last Refill - 01/28/19  Last OV - 04/10/19  Last CPE - 04/07/19  Next Appointment - 10/09/19

## 2019-05-07 NOTE — Telephone Encounter (Signed)
Refill x one year °

## 2019-05-07 NOTE — Progress Notes (Signed)
Cardiology Office Note   Date:  05/08/2019   ID:  Ebony Ashley, DOB September 26, 1945, MRN [id number]  PCP:  Ebony Showers, MD  Cardiologist:   No primary care provider on file.   Chief Complaint  Patient presents with  . Atrial Fibrillation      History of Present Illness: Ebony Ashley is a 74 y.o. female who is referred by Ebony Showers, MD for evaluation of atrial fib.  Is a new diagnosis.  He was found incidentally.  She does not feel any palpitations.  She does have any presyncope or syncope.  She has no chest pressure, neck or arm discomfort.  She gets around somewhat slowly because she has some knee problems and she walks with a cane.  She is only having 1 fall previously however.  She denies any chest pressure, neck or arm discomfort.  She has no shortness of breath, PND or orthopnea.  I saw her in 2013.  She presented with a murmur.  She had mild LVH but no valvular abnormalities.     Past Medical History:  Diagnosis Date  . Atrial fibrillation ([place])   . Diabetes mellitus   . Esophageal spasm   . Esophagitis   . Hiatal hernia   . Hypertension     Past Surgical History:  Procedure Laterality Date  . ABDOMINAL HYSTERECTOMY  1990   DUB, ovaries remain  Dr. Rica Ashley, off ERT 4/11  . TONSILLECTOMY AND ADENOIDECTOMY  1951     Current Outpatient Medications  Medication Sig Dispense Refill  . ALPRAZolam (XANAX) 0.5 MG tablet Take 1 tablet (0.5 mg total) by mouth at bedtime as needed for anxiety. 30 tablet 2  . amLODipine (NORVASC) 10 MG tablet TAKE 1 TABLET BY MOUTH EVERY DAY 90 tablet 1  . clotrimazole (LOTRIMIN) 1 % cream Apply 1 application topically 2 (two) times daily.    . Esomeprazole Magnesium (NEXIUM PO) Take by mouth as needed.    Marland Kitchen glipiZIDE (GLUCOTROL XL) 2.5 MG 24 hr tablet TAKE 1 TABLET (2.5 MG TOTAL) BY MOUTH DAILY WITH BREAKFAST. 90 tablet 0  . JANUVIA 100 MG tablet TAKE 1 TABLET (100 MG TOTAL) BY MOUTH DAILY. DISCONTINUE JANUVIA 50MG . 30 tablet 1    . meclizine (ANTIVERT) 25 MG tablet Take 25 mg by mouth 2 (two) times daily as needed for dizziness.    . metFORMIN (GLUCOPHAGE) 850 MG tablet Take one tablet by mouth daily. 90 tablet 3  . metoprolol succinate (TOPROL-XL) 100 MG 24 hr tablet Take with or immediately following a meal. 90 tablet 3  . nitroGLYCERIN (NITROSTAT) 0.4 MG SL tablet Place 0.4 mg under the tongue every 5 (five) minutes as needed.      . rosuvastatin (CRESTOR) 5 MG tablet TAKE 1 TABLET (5 MG TOTAL) BY MOUTH 3 (THREE) TIMES A WEEK. MONDAYS, [place]. 36 tablet 3  . solifenacin (VESICARE) 5 MG tablet Take 5 mg by mouth daily.    . traMADol (ULTRAM) 50 MG tablet Take 1 tablet (50 mg total) by mouth every 8 (eight) hours as needed. 60 tablet 0  . triamcinolone cream (KENALOG) 0.1 % APPLY TO AFFECTED AREA TWICE A DAY 45 g 2  . apixaban (ELIQUIS) 5 MG TABS tablet Take 1 tablet (5 mg total) by mouth 2 (two) times daily. 60 tablet 11   No current facility-administered medications for this visit.    Allergies:   Adhesive [tape] and Polysporin [bacitracin-polymyxin b]    Social History:  The patient  reports that she has never smoked. She has never used smokeless tobacco. She reports previous alcohol use. She reports that she does not use drugs.   Family History:  The patient's family history includes Arthritis in her mother; Asthma in her brother; Atrial fibrillation (age of onset: 105) in her mother; Colon cancer in her mother; Diabetes in her brother; Fibromyalgia in her mother; Hypertension in her brother; Osteoporosis in her mother.    ROS:  Please see the history of present illness.   Otherwise, review of systems are positive for none.   All other systems are reviewed and negative.    PHYSICAL EXAM: VS:  BP (!) 152/90   Pulse 76   Ht 5\' 4"  (1.626 m)   Wt 264 lb 6.4 oz (119.9 kg)   LMP 01/05/1988 (Approximate)   SpO2 97%   BMI 45.38 kg/m  , BMI Body mass index is 45.38 kg/m. GENERAL:  Well  appearing HEENT:  Pupils equal round and reactive, fundi not visualized, oral mucosa unremarkable NECK:  No jugular venous distention, waveform within normal limits, carotid upstroke brisk and symmetric, no bruits, no thyromegaly LYMPHATICS:  No cervical, inguinal adenopathy LUNGS:  Clear to auscultation bilaterally BACK:  No CVA tenderness CHEST:  Unremarkable HEART:  PMI not displaced or sustained,S1 and S2 within normal limits, no S3, no clicks, no rubs, no murmurs, irregular ABD:  Flat, positive bowel sounds normal in frequency in pitch, no bruits, no rebound, no guarding, no midline pulsatile mass, no hepatomegaly, no splenomegaly EXT:  2 plus pulses throughout, no edema, no cyanosis no clubbing SKIN:  No rashes no nodules NEURO:  Cranial nerves II through XII grossly intact, motor grossly intact throughout PSYCH:  Cognitively intact, oriented to person place and time    EKG:  EKG is ordered today. The ekg ordered today demonstrates atrial fibrillation, rate 84, axis within normal limits, intervals within normal limits, no acute ST-T wave changes.   Recent Labs: 03/20/2019: ALT 18; BUN 12; Creat 0.72; Hemoglobin 16.5; Platelets 284; Potassium 4.9; Sodium 140; TSH 2.29    Lipid Panel    Component Value Date/Time   CHOL 139 03/20/2019 0940   TRIG 106 03/20/2019 0940   HDL 41 (L) 03/20/2019 0940   CHOLHDL 3.4 03/20/2019 0940   VLDL 23 04/08/2015 0904   LDLCALC 79 03/20/2019 0940      Wt Readings from Last 3 Encounters:  05/08/19 264 lb 6.4 oz (119.9 kg)  04/10/19 264 lb (119.7 kg)  11/06/18 260 lb (117.9 kg)      Other studies Reviewed: Additional studies/ records that were reviewed today include: Labs. Review of the above records demonstrates:  Please see elsewhere in the note.     ASSESSMENT AND PLAN:  ATRIAL FIB:   The patient has asymptomatic new diagnosis atrial fibrillation of unknown duration.-This point she seems to have reasonable rate control which will  wear a 3-day monitor to assess.  I will check an echocardiogram.  She has had labs to include a normal TSH.  She has no contraindications to anticoagulation we talked about the risks benefits.  I will start Eliquis.  LVH: This will be assessed with the echo above.  COVID EDUCATION: She has had her vaccines.  Current medicines are reviewed at length with the patient today.  The patient does not have concerns regarding medicines.  The following changes have been made:  no change  Labs/ tests ordered today include:   Orders Placed This Encounter  Procedures  . LONG TERM MONITOR (3-14 DAYS)  . ECHOCARDIOGRAM COMPLETE     Disposition:   FU with me or APP in 3 months.     Signed, Rollene Rotunda, MD  05/08/2019 5:17 PM    [facility]

## 2019-05-08 ENCOUNTER — Ambulatory Visit: Payer: HMO | Admitting: Cardiology

## 2019-05-08 ENCOUNTER — Other Ambulatory Visit: Payer: Self-pay

## 2019-05-08 ENCOUNTER — Encounter: Payer: Self-pay | Admitting: Cardiology

## 2019-05-08 ENCOUNTER — Encounter: Payer: Self-pay | Admitting: *Deleted

## 2019-05-08 VITALS — BP 152/90 | HR 76 | Ht 64.0 in | Wt 264.4 lb

## 2019-05-08 DIAGNOSIS — I517 Cardiomegaly: Secondary | ICD-10-CM | POA: Diagnosis not present

## 2019-05-08 DIAGNOSIS — Z7189 Other specified counseling: Secondary | ICD-10-CM | POA: Diagnosis not present

## 2019-05-08 DIAGNOSIS — I4891 Unspecified atrial fibrillation: Secondary | ICD-10-CM

## 2019-05-08 MED ORDER — APIXABAN 5 MG PO TABS: 5.0000 mg | ORAL_TABLET | Freq: Two times a day (BID) | ORAL | 11 refills | Status: DC

## 2019-05-08 NOTE — Progress Notes (Signed)
Patient ID: Ebony Ashley, female   DOB: 1945-09-15, 74 y.o.   MRN: [id number] Patient enrolled for [employer] to ship a 3 day ZIO XT long term holter monitor to her home.  Instructions will be included in her monitor kit.

## 2019-05-08 NOTE — Patient Instructions (Signed)
Medication Instructions:  START ELIQUIS 5MG  TWICE A DAY (30 DAY FREE CARD GIVEN) *If you need a refill on your cardiac medications before your next appointment, please call your pharmacy*  Lab Work: NONE ORDERED THIS VISIT  Testing/Procedures: Your physician has requested that you have an echocardiogram. Echocardiography is a painless test that uses sound waves to create images of your heart. It provides your doctor with information about the size and shape of your heart and how well your heart's chambers and valves are working. This procedure takes approximately one hour. There are no restrictions for this procedure. [address]  Your physician has recommended that you wear an event monitor. Event monitors are medical devices that record the heart's electrical activity. Doctors most often these monitors to diagnose arrhythmias. Arrhythmias are problems with the speed or rhythm of the heartbeat. The monitor is a small, portable device. You can wear one while you do your normal daily activities. This is usually used to diagnose what is causing palpitations/syncope (passing out).  Follow-Up: At [facility], you and your health needs are our priority.  As part of our continuing mission to provide you with exceptional heart care, we have created designated Provider Care Teams.  These Care Teams include your primary Cardiologist (physician) and Advanced Practice Providers (APPs -  Physician Assistants and Nurse Practitioners) who all work together to provide you with the care you need, when you need it.  We recommend signing up for the patient portal called "MyChart".  Sign up information is provided on this After Visit Summary.  MyChart is used to connect with patients for Virtual Visits (Telemedicine).  Patients are able to view lab/test results, encounter notes, upcoming appointments, etc.  Non-urgent messages can be sent to your provider as well.   To learn more about  what you can do with MyChart, go to [facility].    Your next appointment:   3 month(s)  The format for your next appointment:   In Person  Provider:   You may see one of the following Advanced Practice Providers on your designated Care Team:    [email or web address], PA-C  Azalee Course, DNP, ANP  Joni Reining, PA-C  Corine Shelter, NP    Other Instructions Your physician has recommended that you wear a 3 DAY ZIO-PATCH monitor. The Zio patch cardiac monitor continuously records heart rhythm data for up to 14 days, this is for patients being evaluated for multiple types heart rhythms. For the first 24 hours post application, please avoid getting the Zio monitor wet in the shower or by excessive sweating during exercise. After that, feel free to carry on with regular activities. Keep soaps and lotions away from the ZIO XT Patch.  This will be mailed to you, please expect 7-10 days to receive.  Can Be placed at our [facility] location - [address].

## 2019-05-11 ENCOUNTER — Other Ambulatory Visit (INDEPENDENT_AMBULATORY_CARE_PROVIDER_SITE_OTHER): Payer: HMO

## 2019-05-11 DIAGNOSIS — I10 Essential (primary) hypertension: Secondary | ICD-10-CM | POA: Diagnosis not present

## 2019-05-25 DIAGNOSIS — N3946 Mixed incontinence: Secondary | ICD-10-CM | POA: Diagnosis not present

## 2019-05-25 DIAGNOSIS — R350 Frequency of micturition: Secondary | ICD-10-CM | POA: Diagnosis not present

## 2019-05-29 ENCOUNTER — Other Ambulatory Visit: Payer: Self-pay

## 2019-05-29 ENCOUNTER — Ambulatory Visit (HOSPITAL_COMMUNITY): Payer: HMO | Attending: Cardiology

## 2019-05-29 DIAGNOSIS — I4891 Unspecified atrial fibrillation: Secondary | ICD-10-CM | POA: Diagnosis not present

## 2019-06-21 ENCOUNTER — Other Ambulatory Visit: Payer: Self-pay | Admitting: Internal Medicine

## 2019-06-27 ENCOUNTER — Other Ambulatory Visit: Payer: Self-pay | Admitting: *Deleted

## 2019-06-27 ENCOUNTER — Encounter: Payer: Self-pay | Admitting: *Deleted

## 2019-06-27 NOTE — Patient Outreach (Signed)
[facility]) Care Management Chronic Special Needs Program  06/27/2019  Name: Ebony Ashley DOB: 04-Nov-1945  MRN: [id number]  Ms. Ebony Ashley is enrolled in a chronic special needs plan for Diabetes. Chronic Care Management Coordinator telephoned client to review health risk assessment and to develop individualized care plan.  Introduced the chronic care management program, importance of client participation, and taking their care plan to all provider appointments and inpatient facilities.  Reviewed the transition of care process and possible referral to community care management.  Subjective: Client reports she lives alone, is overall independent, still drives and has neighbor that assists her if needed, has adult son in [place] that visits if client needs anything,  Reports does not check blood sugar and is not going to start, does not have a glucometer and does not want one, RN care manager encouraged client on importance of checking blood sugar but client is adamant she does not want to do this.  Client states she wore heart monitor recently and has appointment in near future with cardiologist to discuss, states " I'm not sure if I have A-fib, guess he will discuss with me"  Client refuses to state a personal health goal.  Health Risk Assessment completed with client.  Client reports she cannot afford januvia and eliquis and would like assistance from pharmacist. Client reports she had a fall approximately 18 months ago and uses cane when she walks outside, has life alert necklace.  Client states she does not have HTA calendar or magnet and would like these items mailed.    Goals Addressed            This Visit's Progress   . Client understands the importance of follow-up with providers by attending scheduled visits       Client reports attending all scheduled appointments with primary care provider and specialists    . Client will use Assistive Devices as needed  and verbalize understanding of device use       Continue to use your cane as needed Keep pathways clear Ask for assistance as needed    . Client will verbalize knowledge of self management of Hypertension as evidences by BP reading of 140/90 or less; or as defined by provider       Plan to check blood pressure regularly.  If you do not have a B/P monitor (cuff), one can be provided to you.  Write results in your [employer] calendar (in the back section). Reviewed blood pressure medication from EMR. Take B/P medications as ordered.  Some may cause you to use the bathroom more. Plan to eat low salt and heart healthy meals full of fruits, vegetables, whole grains, lean protein and limit fat and sugars. Increase activity as tolerated. Reviewed lifestyle modification- smoking cessation, weight control and reducing stress. EMMI education article provided "High blood pressure (hypertension)- What you can do" "Living with Atrial fibrillation" Review and plan to discuss with RN during next telephonic assessment. Use 24 hour nurse advice line as needed at [phone]     . HEMOGLOBIN A1C < 7        Your last documented AIC is 7.2 on 03/20/19. Have your [facility] checked every 6 months if you are at goal or every 3 months if you are not at goal. Check blood sugars daily before eating with goal of 80-130.  You can also check 1 1/2 hours after eating with goal of 180 or less. Plan to eat low carbohydrate and low  salt meals, watch portion sizes and avoid sugar sweetened drinks.  Discussed carbohydrate control meals. Reviewed signs and symptoms of hyperglycemia (high blood sugar) and hypoglycemia (low blood sugar) and actions to take. Review [employer] calendar (sent in the mail) for diabetes action plan in the back. Reviewed nutrition counseling benefit provided by [employer].  Will refer to [place] to assist with dietary management of diabetes.  Increase activity only  if you are able to do it.  Follow doctor recommendations. EMMI education provided on "Diabetes and Diet.  Review and plan to discuss with RN during next telephonic assessment.       . Maintain timely refills of diabetic medication as prescribed within the year .       Contact your RN care manager if you have questions about medicines. Medication review completed from EMR information. It is important to take your medications as prescribed. Reviewed use and possible side effects of diabetes medications. Discussed how to take insulin on a sliding scale. RN care manager referred client to pharmacist for information and assistance. (cannot afford januvia and eliquis)     . Obtain annual  Lipid Profile, LDL-C   On track    Per medical record review, Lipid profile completed on 03/20/19 LDL= 79 The goal for LDL is less than 70mg /dl as you are at high risk for complications. Try to avoid saturated fats, trans-fats and eat more fiber. Plan to take statin (cholesterol) medicine as ordered.     . Obtain Annual Eye (retinal)  Exam    On track    Your last documented eye exam was on 02/19/19 Diabetes can affect your vision.  Plan to have a dilated eye exam every year. Advised client to keep and/ or schedule appointment with eye doctor.      . Obtain Annual Foot Exam   On track    Your doctor should check your bare feet at each visit. Diabetes can affect the nerves in your feet, causing decreased feeling or numbness. Check your feet and in-between toes daily for cuts, bruises, redness, blisters or sores.  If you cannot reach them, use a mirror. Wash feet with soap and water, dry feet well especially between toes.  Don't use too much lotion. Wear shoes that are not too tight and don't walk barefoot.     . Obtain annual screen for micro albuminuria (urine) , nephropathy (kidney problems)   On track    Diabetes can affect your kidneys. It is important for your doctor to check your urine at least  once a year  These tests show how your kidneys are working.      . Obtain Hemoglobin A1C at least 2 times per year       Client reports Hgb AIC checked twice yearly    . Visit Primary Care Provider or Endocrinologist at least 2 times per year    On track    Client is attending primary care provider appointments as scheduled       Plan:    RN care manager faxed today's note with updated individualized care plan to primary care provider, mailed updated individualized care plan to client along with education materials, consent form, HTA calendar and 24 hour nurse advice line magnet.  Chronic care management coordination will outreach in:  9-12 months  Will refer client to:  Pharmacist for medication assistance   Kassie Mends Nursing/RN [place] Case Manager, C-SNP  [phone]

## 2019-06-27 NOTE — Patient Outreach (Signed)
Pharmacy referral was sent to [facility] Pharm[facility] department through their [employer] portal.  Ticket saved successfully with Tracking ID: [id number]

## 2019-07-02 ENCOUNTER — Telehealth: Payer: Self-pay

## 2019-07-02 NOTE — Telephone Encounter (Signed)
Called to check on her cologuard and exact sciences has not sent her a kit bc this needs to be done at a 3 year mark which will be on 08/24/19. Last cologuard was 08/23/2016.

## 2019-07-13 ENCOUNTER — Other Ambulatory Visit: Payer: Self-pay | Admitting: Internal Medicine

## 2019-08-08 ENCOUNTER — Telehealth: Payer: Self-pay | Admitting: *Deleted

## 2019-08-08 ENCOUNTER — Encounter: Payer: Self-pay | Admitting: Cardiology

## 2019-08-08 ENCOUNTER — Other Ambulatory Visit: Payer: Self-pay

## 2019-08-08 ENCOUNTER — Ambulatory Visit: Payer: Medicare Other | Admitting: Cardiology

## 2019-08-08 VITALS — BP 130/80 | HR 74 | Ht 65.0 in | Wt 266.0 lb

## 2019-08-08 DIAGNOSIS — E119 Type 2 diabetes mellitus without complications: Secondary | ICD-10-CM | POA: Diagnosis not present

## 2019-08-08 DIAGNOSIS — I4891 Unspecified atrial fibrillation: Secondary | ICD-10-CM | POA: Diagnosis not present

## 2019-08-08 DIAGNOSIS — I272 Pulmonary hypertension, unspecified: Secondary | ICD-10-CM | POA: Insufficient documentation

## 2019-08-08 DIAGNOSIS — Z7901 Long term (current) use of anticoagulants: Secondary | ICD-10-CM | POA: Diagnosis not present

## 2019-08-08 DIAGNOSIS — I10 Essential (primary) hypertension: Secondary | ICD-10-CM

## 2019-08-08 NOTE — Assessment & Plan Note (Signed)
Needs f/u echo in May 2022

## 2019-08-08 NOTE — Telephone Encounter (Signed)
Monitor serial # [id number] was returned to [facility] with no data on it.  Monitor was never activated/ turned on.  Patient will not be charged for monitor with no data.   Message sent to Dannette Barbara to inquire if we should enroll patient for a replacement  3 day ZIO XT to be shipped to her home.

## 2019-08-08 NOTE — Assessment & Plan Note (Signed)
On oral agents-PCP following 

## 2019-08-08 NOTE — Assessment & Plan Note (Signed)
BMI 44- high risk for sleep apnea but she did not seem interested in pursuing this

## 2019-08-08 NOTE — Assessment & Plan Note (Signed)
B/P controlled 

## 2019-08-08 NOTE — Patient Instructions (Addendum)
Medication Instructions:  Continue current medications  *If you need a refill on your cardiac medications before your next appointment, please call your pharmacy*   Lab Work: None Ordered   Testing/Procedures: None Ordered   Follow-Up: At [facility], you and your health needs are our priority.  As part of our continuing mission to provide you with exceptional heart care, we have created designated Provider Care Teams.  These Care Teams include your primary Cardiologist (physician) and Advanced Practice Providers (APPs -  Physician Assistants and Nurse Practitioners) who all work together to provide you with the care you need, when you need it.  We recommend signing up for the patient portal called "MyChart".  Sign up information is provided on this After Visit Summary.  MyChart is used to connect with patients for Virtual Visits (Telemedicine).  Patients are able to view lab/test results, encounter notes, upcoming appointments, etc.  Non-urgent messages can be sent to your provider as well.   To learn more about what you can do with MyChart, go to [email or web address].    Your next appointment:   6 month(s)  The format for your next appointment:   In Person  Provider:   You may see Rollene Rotunda, MD or one of the following Advanced Practice Providers on your designated Care Team:    Theodore Demark, PA-C  Joni Reining, DNP, ANP  Cadence Fransico Michael, [place]

## 2019-08-08 NOTE — Assessment & Plan Note (Signed)
CHADS VASC=4 (5 in Sept)- she is on Eliquis though she is not happy about it.  She knows its for stroke prevention-"my husband had a terrible stroke".

## 2019-08-08 NOTE — Telephone Encounter (Signed)
Pt saw Corine Shelter in the office today, pt stated that she worn in monitor back in May but she haven't heard anything about the monitor, I reach out to Andee Lineman and asked about pt monitor. She inform me that she will reach out to [facility], she stated " They received the monitor but there was no data on it. They state patient may not have activated / turned on monitor after applying. Would you like me to enroll her for a replacement monitor to be shipped to her home, (fyi there is no charge for a monitor that does not have data on it). As per Corine Shelter he do not want another monitor ordered.

## 2019-08-08 NOTE — Progress Notes (Signed)
Cardiology Office Note:    Date:  08/08/2019   ID:  Ebony Ashley, DOB 1945-11-08, MRN [id number]  PCP:  Margaree Mackintosh, MD  Cardiologist:  Dr Antoine Poche Electrophysiologist:  None   Referring MD: Margaree Mackintosh, MD   No chief complaint on file.   History of Present Illness:    Ebony Ashley is a pleasant, obese,  74 y.o. female with a hx of HTN, DJD, and PAF who saw Dr Antoine Poche for asymptomatic AF in May 2021.  This was picked up by her PCP.  The patient has no history of stroke and was unaware of her AF.  Her rate was controlled and Eliquis was added.  Echo done 05/29/2019 showed normal LVF, mild LAE, and moderate pulmonary HTN.  A 3 day ZIO was sent to the patient as well.  The patient assures me she wore the monitor and sent it in but I was unable to locate the results.  In any event she remains asymptomatic from her AF and her rate is controlled today on exam.   Past Medical History:  Diagnosis Date  . Atrial fibrillation (HCC)   . Diabetes mellitus   . Esophageal spasm   . Esophagitis   . Hiatal hernia   . Hypertension     Past Surgical History:  Procedure Laterality Date  . ABDOMINAL HYSTERECTOMY  1990   DUB, ovaries remain  Dr. Laurena Bering, off ERT 4/11  . TONSILLECTOMY AND ADENOIDECTOMY  1951    Current Medications: Current Meds  Medication Sig  . amLODipine (NORVASC) 10 MG tablet TAKE 1 TABLET BY MOUTH EVERY DAY  . apixaban (ELIQUIS) 5 MG TABS tablet Take 1 tablet (5 mg total) by mouth 2 (two) times daily.  . Esomeprazole Magnesium (NEXIUM PO) Take by mouth as needed.  [employer] glipiZIDE (GLUCOTROL XL) 2.5 MG 24 hr tablet TAKE 1 TABLET (2.5 MG TOTAL) BY MOUTH DAILY WITH BREAKFAST.  [employer] JANUVIA 100 MG tablet TAKE 1 TABLET (100 MG TOTAL) BY MOUTH DAILY. DISCONTINUE JANUVIA 50MG .  . metFORMIN (GLUCOPHAGE) 850 MG tablet Take one tablet by mouth daily.  . metoprolol succinate (TOPROL-XL) 100 MG 24 hr tablet Take with or immediately following a meal.  . nitroGLYCERIN  (NITROSTAT) 0.4 MG SL tablet Place 0.4 mg under the tongue every 5 (five) minutes as needed.    . rosuvastatin (CRESTOR) 5 MG tablet TAKE 1 TABLET (5 MG TOTAL) BY MOUTH 3 (THREE) TIMES A WEEK. MONDAYS, WEDNESDAYS AND FRIDAYS.  solifenacin (VESICARE) 10 MG tablet Take 10 mg by mouth daily.  . traMADol (ULTRAM) 50 MG tablet Take 1 tablet (50 mg total) by mouth every 8 (eight) hours as needed.  . triamcinolone cream (KENALOG) 0.1 % APPLY TO AFFECTED AREA TWICE A DAY     Allergies:   Adhesive [tape] and Polysporin [bacitracin-polymyxin b]   Social History   Socioeconomic History  . Marital status: Widowed    Spouse name: Not on file  . Number of children: 2  . Years of education: Not on file  . Highest education level: Not on file  Occupational History  . Occupation: Lab at [employer]  . Smoking status: Never Smoker  . Smokeless tobacco: Never Used  Substance and Sexual Activity  . Alcohol use: Not Currently  . Drug use: No  . Sexual activity: Not Currently    Partners: Male    Birth control/protection: Surgical    Comment: hysterectomy  Other Topics Concern  . Not on  file  Social History Narrative  . Not on file   Social Determinants of Health   Financial Resource Strain:   . Difficulty of Paying Living Expenses:   Food Insecurity: No Food Insecurity  . Worried About Programme researcher, broadcasting/film/video in the Last Year: Never true  . Ran Out of Food in the Last Year: Never true  Transportation Needs:   . Lack of Transportation (Medical):   [employer] Lack of Transportation (Non-Medical):   Physical Activity:   . Days of Exercise per Week:   . Minutes of Exercise per Session:   Stress:   . Feeling of Stress :   Social Connections:   . Frequency of Communication with Friends and Family:   . Frequency of Social Gatherings with Friends and Family:   . Attends Religious Services:   . Active Member of Clubs or Organizations:   . Attends Banker Meetings:   [employer] Marital  Status:      Family History: The patient's family history includes Arthritis in her mother; Asthma in her brother; Atrial fibrillation (age of onset: 51) in her mother; Colon cancer in her mother; Diabetes in her brother; Fibromyalgia in her mother; Hypertension in her brother; Osteoporosis in her mother.  ROS:   Please see the history of present illness. She denies snoring.  She admits she doesn't sleep well.  She does not think she has sleep apnea- "my husband had that"     She had DJD in her left knee and uses a cane.  She has not had any recent falls.  All other systems reviewed and are negative.  EKGs/Labs/Other Studies Reviewed:    The following studies were reviewed today:  Echo 05/29/2019- 1. Normal LV function; mild LVH; trace AI; mild biatrial enlargement.  2. Left ventricular ejection fraction, by estimation, is 60 to 65%. The  left ventricle has normal function. The left ventricle has no regional  wall motion abnormalities. There is mild left ventricular hypertrophy.  Left ventricular diastolic function  could not be evaluated.  3. Right ventricular systolic function is normal. The right ventricular  size is normal. There is moderately elevated pulmonary artery systolic  pressure.  4. Left atrial size was mildly dilated.  5. Right atrial size was mildly dilated.  6. The mitral valve is normal in structure. No evidence of mitral valve  regurgitation. No evidence of mitral stenosis.  7. The aortic valve has an indeterminant number of cusps. Aortic valve  regurgitation is trivial. No aortic stenosis is present.  8. The inferior vena cava is normal in size with greater than 50%  respiratory variability, suggesting right atrial pressure of 3 mmHg.   EKG:  EKG is not ordered today.  The ekg ordered 05/08/2019 AF with VR 84  Recent Labs: 03/20/2019: ALT 18; BUN 12; Creat 0.72; Hemoglobin 16.5; Platelets 284; Potassium 4.9; Sodium 140; TSH 2.29  Recent Lipid Panel     Component Value Date/Time   CHOL 139 03/20/2019 0940   TRIG 106 03/20/2019 0940   HDL 41 (L) 03/20/2019 0940   CHOLHDL 3.4 03/20/2019 0940   VLDL 23 04/08/2015 0904   LDLCALC 79 03/20/2019 0940    Physical Exam:    VS:  BP 130/80   Pulse 74   Ht 5\' 5"  (1.651 m)   Wt 266 lb (120.7 kg)   LMP 01/05/1988 (Approximate)   SpO2 95%   BMI 44.26 kg/m     Wt Readings from Last 3 Encounters:  08/08/19 266  lb (120.7 kg)  05/08/19 264 lb 6.4 oz (119.9 kg)  04/10/19 264 lb (119.7 kg)     GEN: Obese caucasian female,  well developed in no acute distress HEENT: Normal NECK: No JVD; No carotid bruits LYMPHATICS: No lymphadenopathy CARDIAC: irregularly irregular, no murmurs, rubs, gallops RESPIRATORY:  Clear to auscultation without rales, wheezing or rhonchi  ABDOMEN: Obese, soft, non-tender, non-distended MUSCULOSKELETAL:  No edema; DJD knees- she uses a cane  SKIN: Warm and dry NEUROLOGIC:  Alert and oriented x 3 PSYCHIATRIC:  Normal affect   ASSESSMENT:    Atrial fibrillation (HCC) Incidental finding- seen in May 2021.  Rate controlled, LVF normal, pt is asymptomatic.  Anticoagulated CHADS VASC=4 (5 in Sept)- she is on Eliquis though she is not happy about it.  She knows its for stroke prevention-"my husband had a terrible stroke".  Non-insulin dependent type 2 diabetes mellitus (HCC) On oral agents- PCP following  Morbid obesity (HCC) BMI 44- high risk for sleep apnea but she did not seem interested in pursuing this  Hypertension B/P controlled  Mild pulmonary hypertension (HCC) Needs f/u echo in May 2022  PLAN:    Same Rx.  F/U Dr Antoine Poche in 6 months.  I will see if we can track down her monitor results.    Medication Adjustments/Labs and Tests Ordered: Current medicines are reviewed at length with the patient today.  Concerns regarding medicines are outlined above.  No orders of the defined types were placed in this encounter.  No orders of the defined types  were placed in this encounter.   Patient Instructions  Medication Instructions:  Continue current medications  *If you need a refill on your cardiac medications before your next appointment, please call your pharmacy*   Lab Work: None Ordered   Testing/Procedures: None Ordered   Follow-Up: At [facility], you and your health needs are our priority.  As part of our continuing mission to provide you with exceptional heart care, we have created designated Provider Care Teams.  These Care Teams include your primary Cardiologist (physician) and Advanced Practice Providers (APPs -  Physician Assistants and Nurse Practitioners) who all work together to provide you with the care you need, when you need it.  We recommend signing up for the patient portal called "MyChart".  Sign up information is provided on this After Visit Summary.  MyChart is used to connect with patients for Virtual Visits (Telemedicine).  Patients are able to view lab/test results, encounter notes, upcoming appointments, etc.  Non-urgent messages can be sent to your provider as well.   To learn more about what you can do with MyChart, go to [email or web address].    Your next appointment:   6 month(s)  The format for your next appointment:   In Person  Provider:   You may see Rollene Rotunda, MD or one of the following Advanced Practice Providers on your designated Care Team:    Theodore Demark, PA-C  Joni Reining, DNP, ANP  Cadence Fransico Michael, PA-C       Signed, Corine Shelter, [place]  08/08/2019 2:09 PM    [facility]

## 2019-08-08 NOTE — Assessment & Plan Note (Signed)
Incidental finding- seen in May 2021.  Rate controlled, LVF normal, pt is asymptomatic.

## 2019-08-16 ENCOUNTER — Other Ambulatory Visit: Payer: Self-pay | Admitting: *Deleted

## 2019-08-16 NOTE — Patient Outreach (Signed)
  [facility]) Care Management Chronic Special Needs Program    08/16/2019  Name: [name], DOB: 12-Oct-1945  MRN: [id number]   Ms. Israella Hubert is enrolled in a chronic special needs plan for Diabetes.  RN care manager received notification client dis-enrolled on 08/04/19.  Case closure letter and today's note faxed to primary care provider.  Case closed  Irving Shows [facility], BSN [facility] RN Care Coordinator, CSNP [phone]

## 2019-09-13 ENCOUNTER — Encounter: Payer: Self-pay | Admitting: Internal Medicine

## 2019-09-13 LAB — COLOGUARD: Cologuard: NEGATIVE

## 2019-10-08 ENCOUNTER — Other Ambulatory Visit: Payer: Medicare Other | Admitting: Internal Medicine

## 2019-10-08 ENCOUNTER — Other Ambulatory Visit: Payer: Self-pay

## 2019-10-08 DIAGNOSIS — E119 Type 2 diabetes mellitus without complications: Secondary | ICD-10-CM

## 2019-10-08 DIAGNOSIS — E1169 Type 2 diabetes mellitus with other specified complication: Secondary | ICD-10-CM

## 2019-10-09 ENCOUNTER — Other Ambulatory Visit: Payer: Self-pay | Admitting: Internal Medicine

## 2019-10-09 ENCOUNTER — Ambulatory Visit (INDEPENDENT_AMBULATORY_CARE_PROVIDER_SITE_OTHER): Payer: Medicare Other | Admitting: Internal Medicine

## 2019-10-09 ENCOUNTER — Encounter: Payer: Self-pay | Admitting: Internal Medicine

## 2019-10-09 VITALS — BP 110/80 | HR 74 | Ht 65.0 in | Wt 262.0 lb

## 2019-10-09 DIAGNOSIS — E785 Hyperlipidemia, unspecified: Secondary | ICD-10-CM

## 2019-10-09 DIAGNOSIS — I10 Essential (primary) hypertension: Secondary | ICD-10-CM | POA: Diagnosis not present

## 2019-10-09 DIAGNOSIS — Z23 Encounter for immunization: Secondary | ICD-10-CM

## 2019-10-09 DIAGNOSIS — I4891 Unspecified atrial fibrillation: Secondary | ICD-10-CM

## 2019-10-09 DIAGNOSIS — E1169 Type 2 diabetes mellitus with other specified complication: Secondary | ICD-10-CM | POA: Diagnosis not present

## 2019-10-09 LAB — HEMOGLOBIN A1C
Hgb A1c MFr Bld: 7 % of total Hgb — ABNORMAL HIGH (ref <5.7)
Mean Plasma Glucose: 154 (calc)
eAG (mmol/L): 8.5 (calc)

## 2019-10-09 NOTE — Progress Notes (Signed)
   Subjective:    Patient ID: Ebony Ashley, female    DOB: 09-Jan-1945, 74 y.o.   MRN: [id number]  HPI 74 year old Female for 6 month follow up. Says she has not heard from 3 day Holter monitor. Notes in chart say no info received. She needs to contact Cardiology office.  Has not had third COVID-19 booster yet.  Needs to get this done.  Flu vaccine given.  Has talked about moving to [place] with daughter who is a Engineer, civil (consulting).  Saw Corine Shelter at cardiology office and August for follow-up on A. fib.  She is asymptomatic from A. fib and her rate is controlled.  Hypertension is stable on current regimen.  Hemoglobin A1c stable at 7%.  Cologuard order was sent in September.  Result was negative.  Remains on Januvia and glipizide as well as Metformin for diabetes.  For hypertension she is on amlodipine.  Is on Toprol as well for hypertension which helps with controlling atrial fibrillation.  She is on Crestor 5 mg 3 times a week.  She also is on Eliquis 5 mg twice daily.    Review of Systems Had negative Cologard test recently     Objective:   Physical Exam Vital signs reviewed and are stable.  Blood pressure 110/80 pulse 74 and regular pulse oximetry 97% weight 262 pounds BMI is 43.60  Skin warm and dry.  No cervical adenopathy.  Chest is clear to auscultation.  Cardiac exam frequent irregular contractions consistent with atrial fibrillation.  No lower extremity pitting edema.     Assessment & Plan:  Atrial fibrillation treated with Eliquis  Type 2 diabetes mellitus-hemoglobin A1c stable at 7%  Essential hypertension stable on current regimen  Hyperlipidemia-lipid panel in March was stable except for low HDL of 41 on statin medication  Morbid obesity-really not able to exercise.  Plan: She will continue with current medications and follow-up here in 6 months.

## 2019-10-09 NOTE — Addendum Note (Signed)
Addended by: Gregery Na on: 10/09/2019 05:04 PM   Modules accepted: Orders

## 2019-10-10 LAB — MICROALBUMIN / CREATININE URINE RATIO
Creatinine, Urine: 68 mg/dL (ref 20–275)
Microalb Creat Ratio: 29 mcg/mg creat (ref <30)
Microalb, Ur: 2 mg/dL

## 2019-10-19 ENCOUNTER — Other Ambulatory Visit: Payer: Self-pay | Admitting: Internal Medicine

## 2019-11-03 ENCOUNTER — Other Ambulatory Visit: Payer: Self-pay | Admitting: Internal Medicine

## 2019-11-04 NOTE — Patient Instructions (Signed)
It was a pleasure to see you today.  Medical issues are stable on current regimen.  Continue to monitor glucose.  Continue with Eliquis for atrial fibrillation.  Continue with lipid-lowering medication and blood pressure medications.  Follow-up here in 6 months for Medicare wellness and health maintenance exam.  Flu vaccine given today.

## 2019-11-20 ENCOUNTER — Other Ambulatory Visit: Payer: Self-pay | Admitting: Internal Medicine

## 2019-12-17 ENCOUNTER — Other Ambulatory Visit: Payer: Self-pay | Admitting: Internal Medicine

## 2019-12-21 ENCOUNTER — Other Ambulatory Visit: Payer: Self-pay | Admitting: Internal Medicine

## 2019-12-21 NOTE — Telephone Encounter (Signed)
Does she need Tramadol?

## 2019-12-21 NOTE — Telephone Encounter (Signed)
Patient takes as needed weekly or so when the weather changes.  She takes it for knee and elbow ache.  Patient is open to other options.

## 2019-12-29 IMAGING — MG DIGITAL DIAGNOSTIC UNILATERAL LEFT MAMMOGRAM WITH TOMO AND CAD
6 series · 6 of 18 positions shown · non-contrast
Comparison: Previous exam(s).

CLINICAL DATA: Patient for short-term follow-up probably benign
left breast mass.

EXAM:
DIGITAL DIAGNOSTIC LEFT MAMMOGRAM WITH CAD AND TOMO
ULTRASOUND LEFT BREAST

[L CC synth-2D]
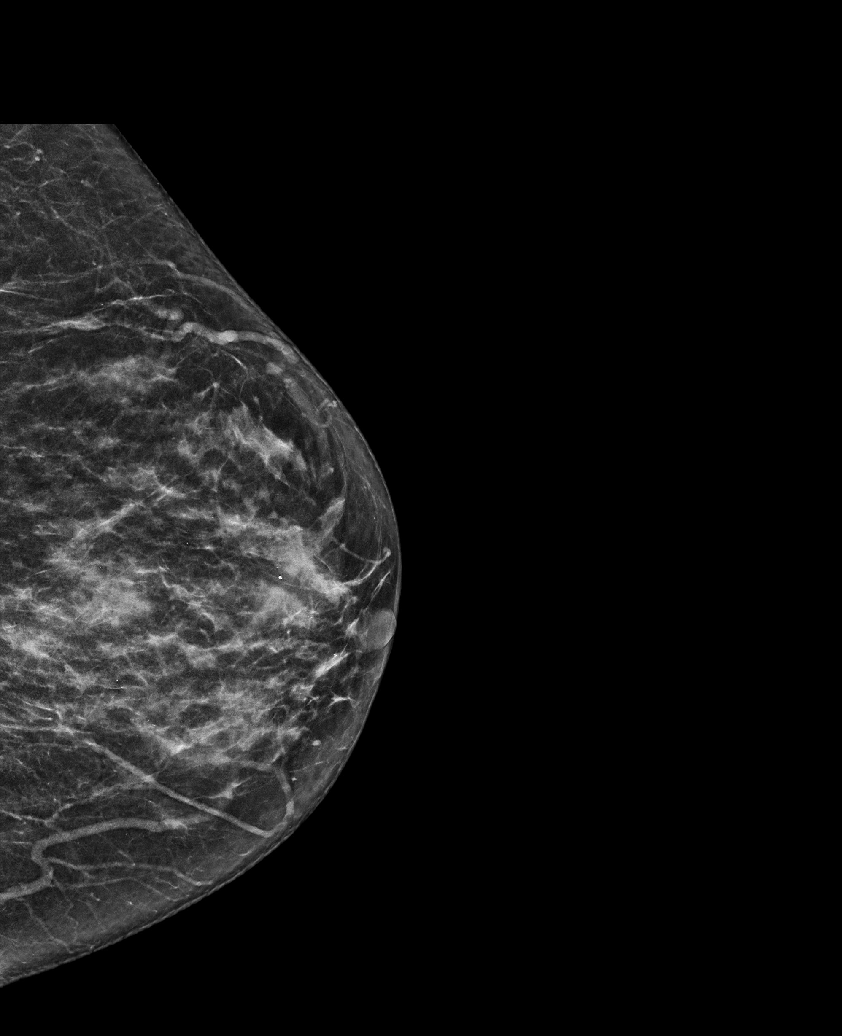

[L MLO synth-2D (1 of 2)]
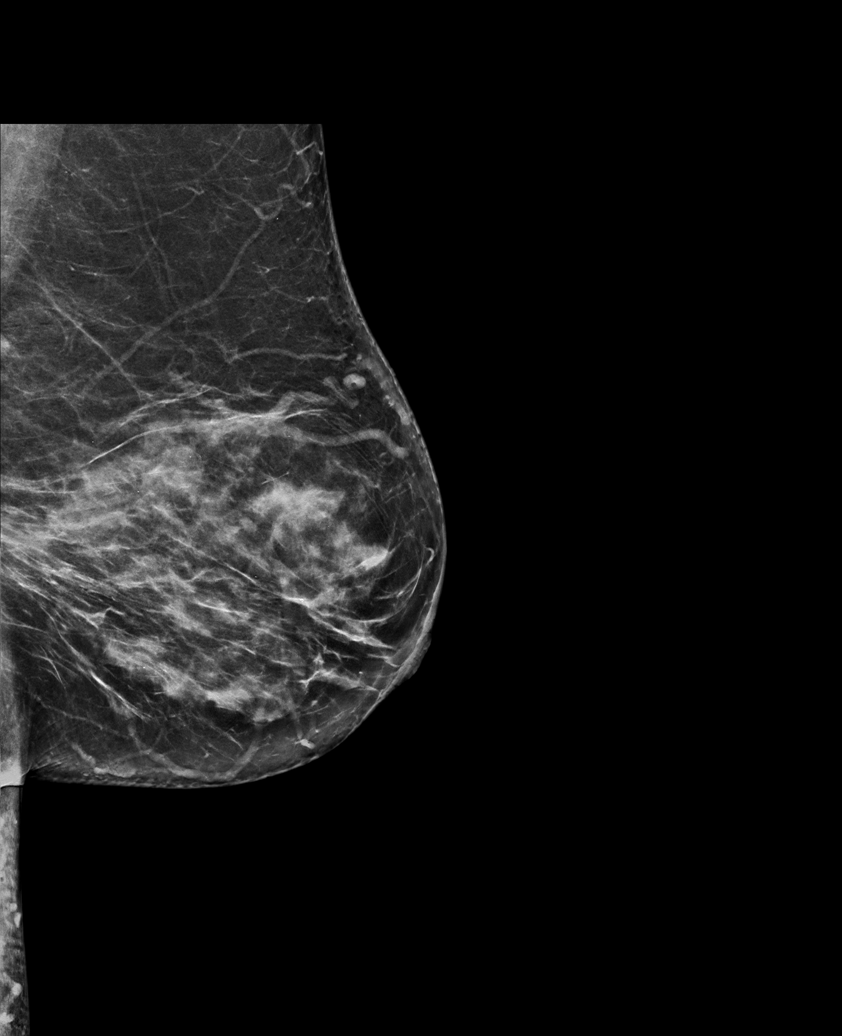

[L MLO synth-2D (2 of 2)]
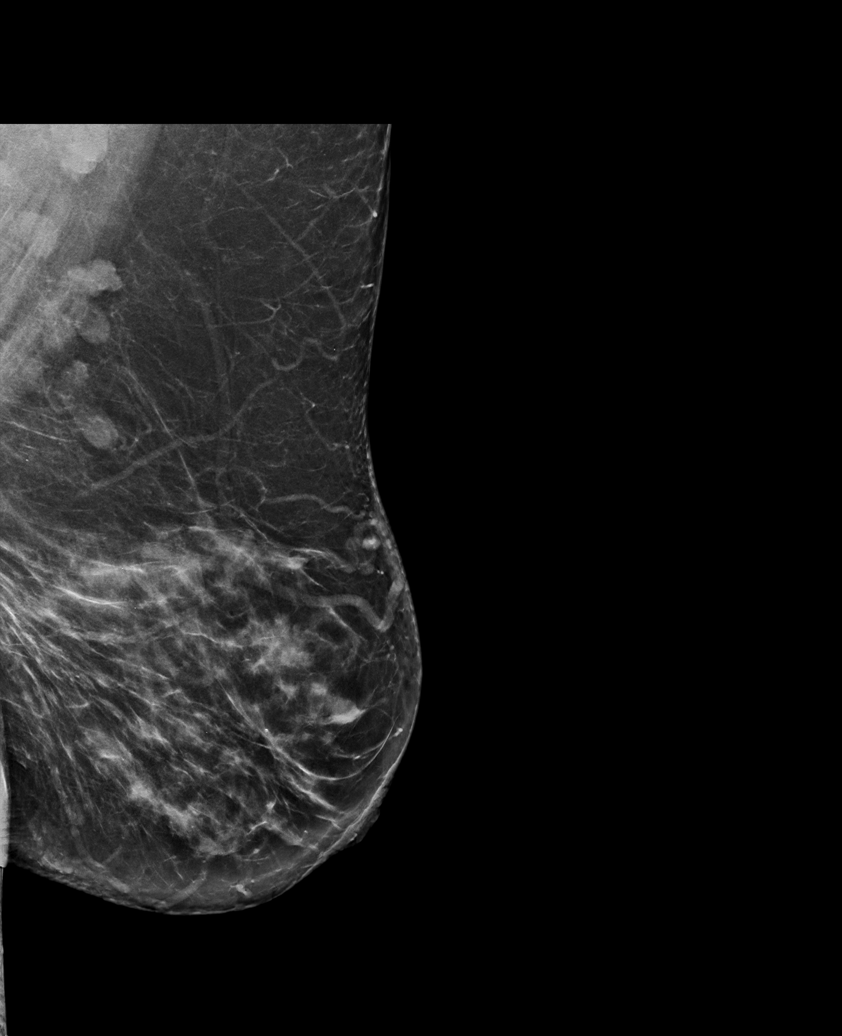

[L MLO tomo (1 of 2) · tomo slice 39/78.0]
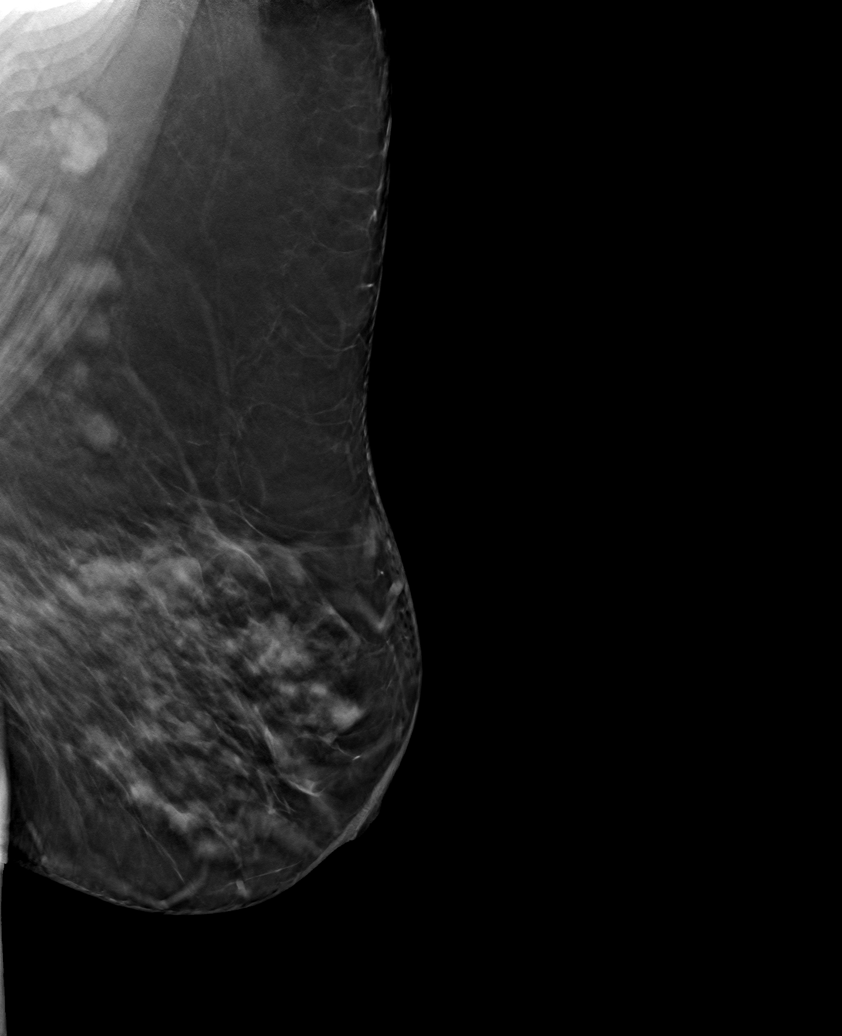

[L CC tomo · tomo slice 30/59.0]
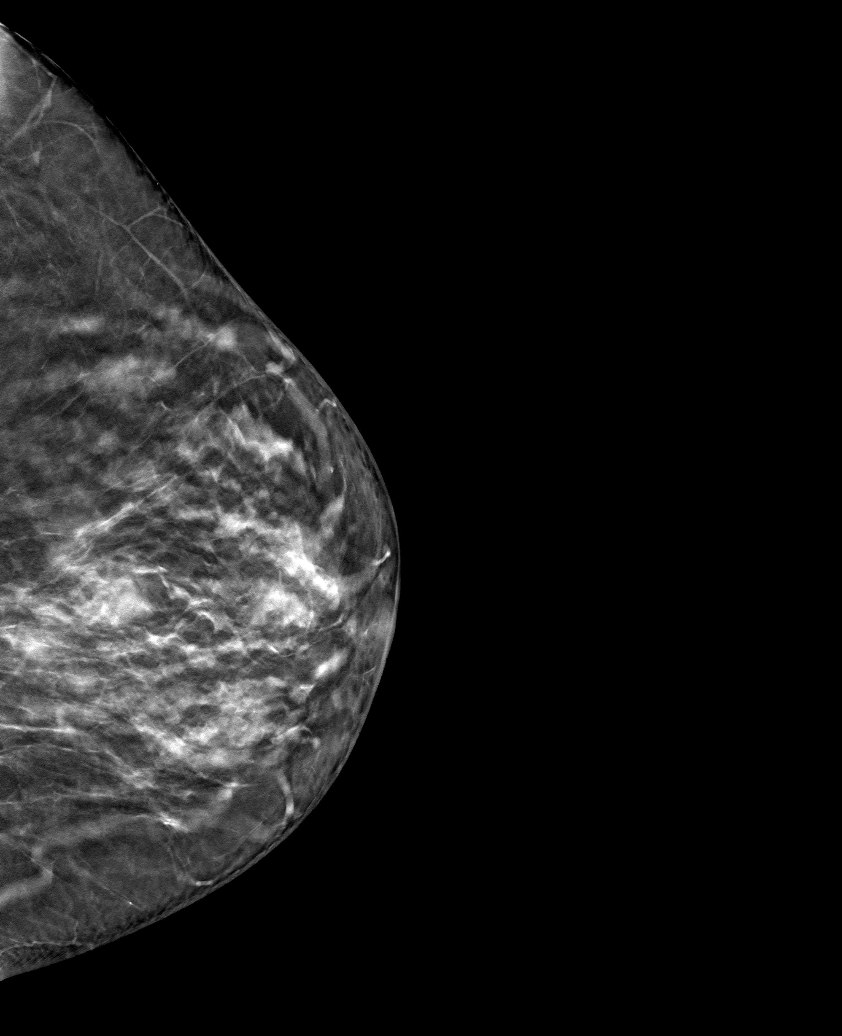

[L MLO tomo (2 of 2) · tomo slice 37/73.0]
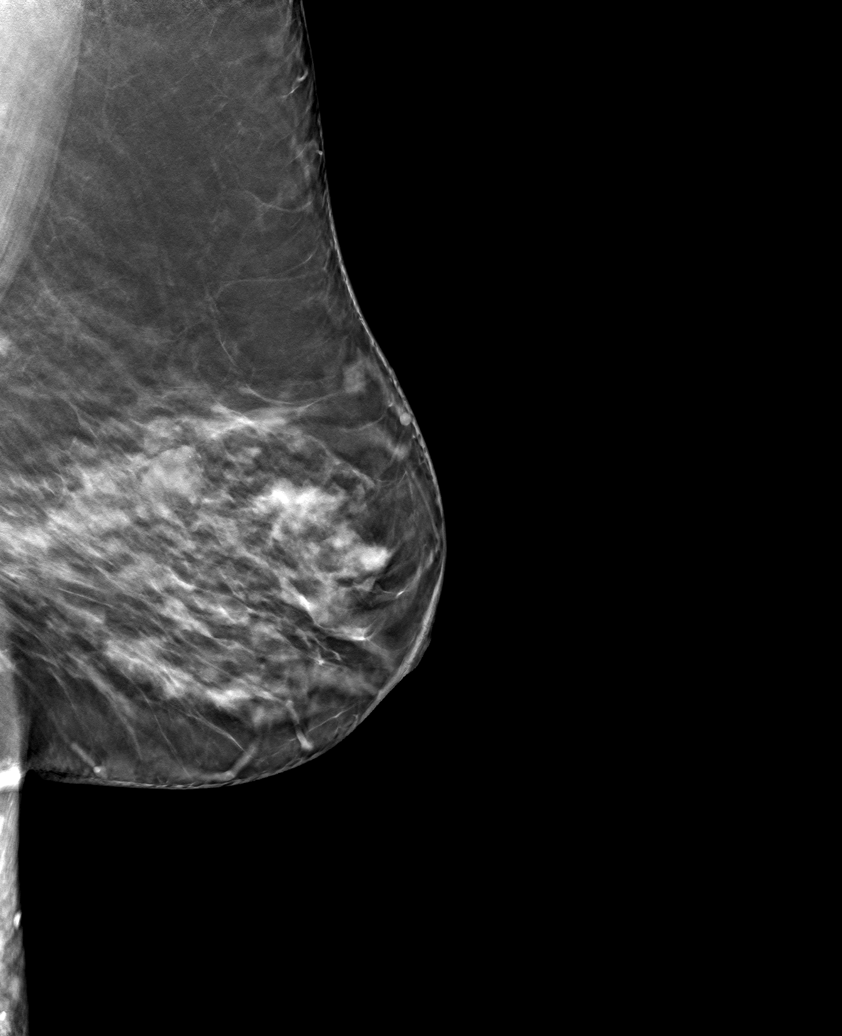

[6 of 18 positions shown; findings below may reference images not displayed]

ACR Breast Density Category c: The breast tissue is heterogeneously
dense, which may obscure small masses.
FINDINGS: Persistent obscured mass within the medial aspect of the left
breast. No additional concerning masses, calcifications or
distortion identified within either breast.

Mammographic images were processed with CAD.

Targeted ultrasound is performed, showing an unchanged oval
circumscribed hypoechoic mass left breast 11 o'clock position 4 cm
from nipple measuring 1.3 x 1.0 x 1.3 cm, previously 1.3 x 0.9 x
cm.
IMPRESSION: Stable probably benign left breast mass.

RECOMMENDATION:
Bilateral diagnostic mammography and left breast ultrasound in 6
months.

I have discussed the findings and recommendations with the patient.
Results were also provided in writing at the conclusion of the
visit. If applicable, a reminder letter will be sent to the patient
regarding the next appointment.

BI-RADS CATEGORY  3: Probably benign.

## 2019-12-29 IMAGING — US ULTRASOUND LEFT BREAST LIMITED
1 series · 4 of 4 positions shown · non-contrast
Comparison: Previous exam(s).

CLINICAL DATA: Patient for short-term follow-up probably benign
left breast mass.

EXAM:
DIGITAL DIAGNOSTIC LEFT MAMMOGRAM WITH CAD AND TOMO
ULTRASOUND LEFT BREAST

[Series 1: ultrasound left breast limited · 0.07mm/px · 4 of 4 slices shown]
[im 1/4]
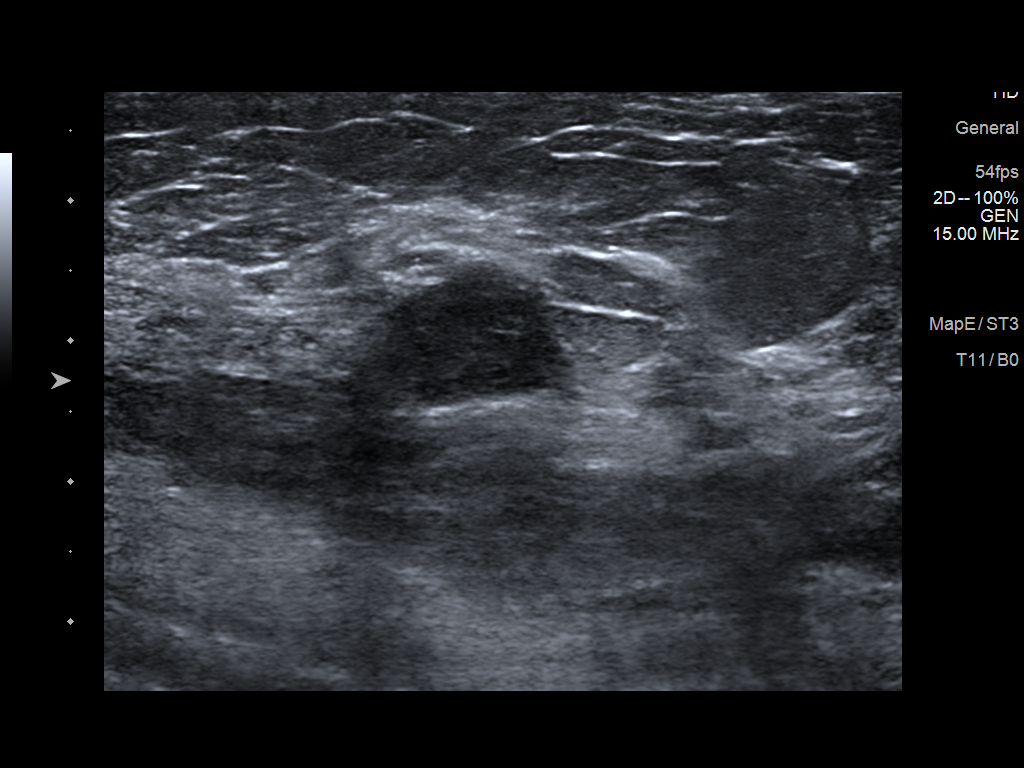
[im 2/4]
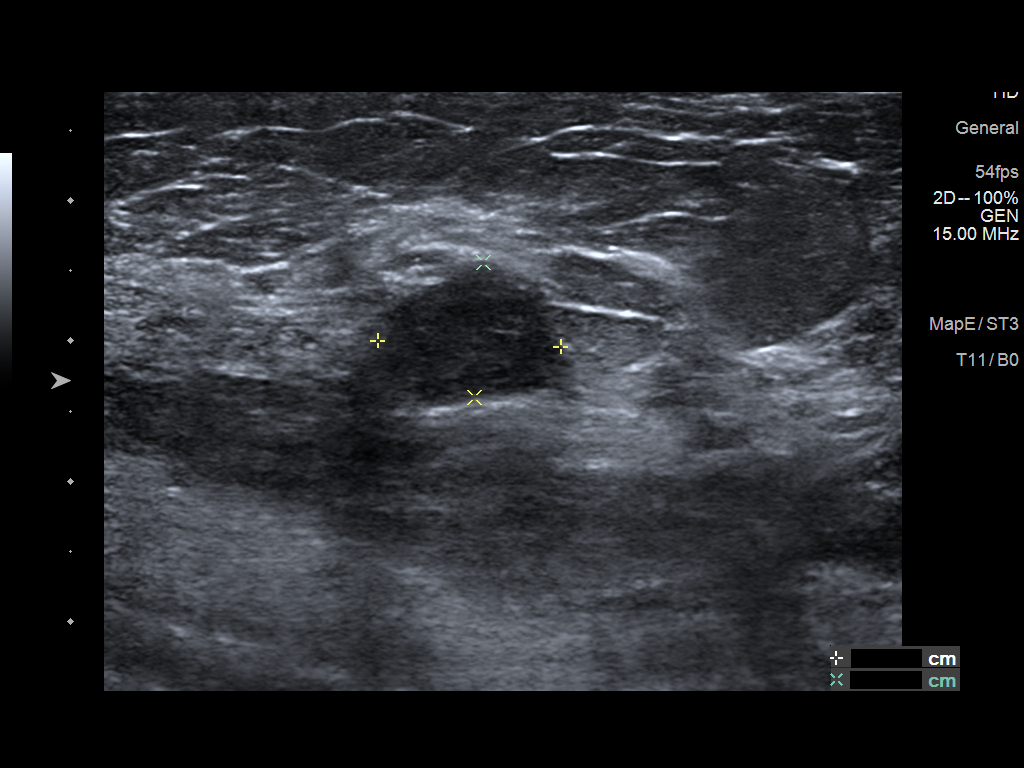
[im 3/4]
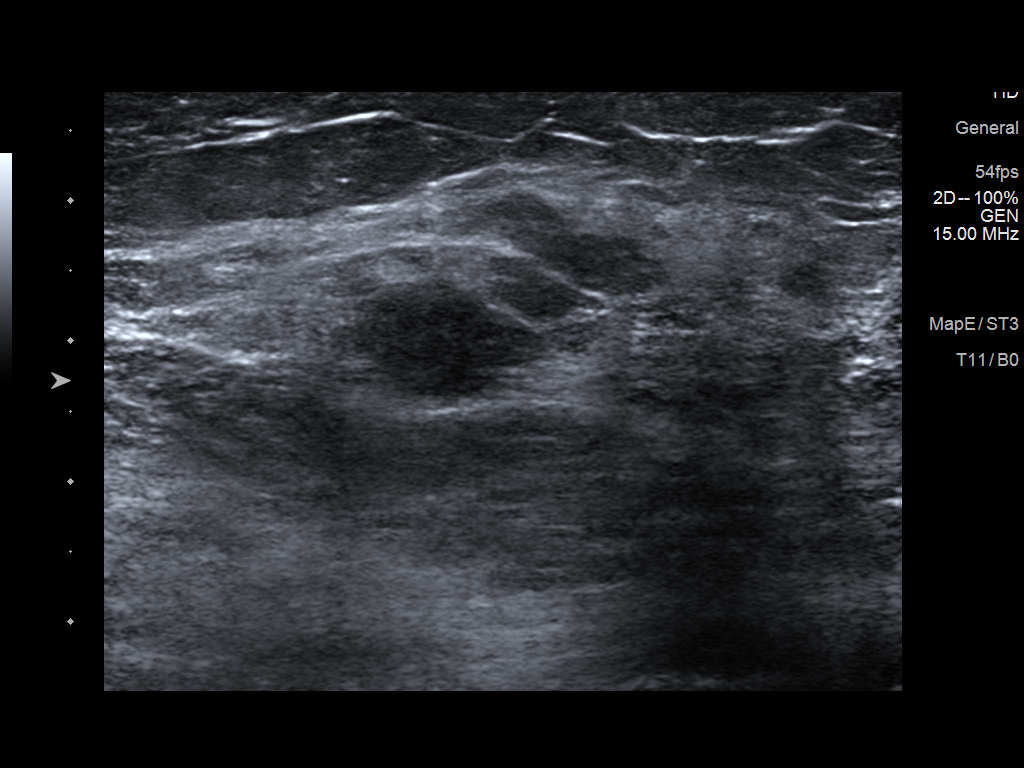
[im 4/4]
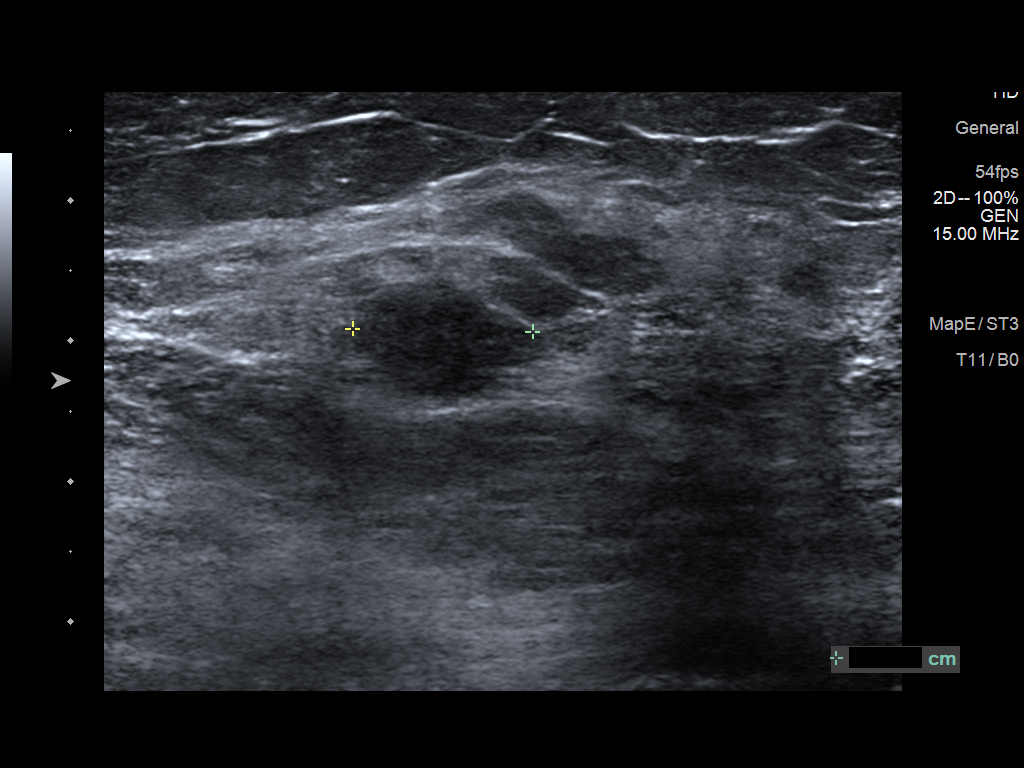

[4 of 4 positions shown; findings below may reference images not displayed]

ACR Breast Density Category c: The breast tissue is heterogeneously
dense, which may obscure small masses.
FINDINGS: Persistent obscured mass within the medial aspect of the left
breast. No additional concerning masses, calcifications or
distortion identified within either breast.

Mammographic images were processed with CAD.

Targeted ultrasound is performed, showing an unchanged oval
circumscribed hypoechoic mass left breast 11 o'clock position 4 cm
from nipple measuring 1.3 x 1.0 x 1.3 cm, previously 1.3 x 0.9 x
cm.
IMPRESSION: Stable probably benign left breast mass.

RECOMMENDATION:
Bilateral diagnostic mammography and left breast ultrasound in 6
months.

I have discussed the findings and recommendations with the patient.
Results were also provided in writing at the conclusion of the
visit. If applicable, a reminder letter will be sent to the patient
regarding the next appointment.

BI-RADS CATEGORY  3: Probably benign.

## 2020-01-14 ENCOUNTER — Other Ambulatory Visit: Payer: Self-pay | Admitting: Internal Medicine

## 2020-03-26 NOTE — Progress Notes (Signed)
Cardiology Office Note   Date:  03/27/2020   ID:  Ebony, Ashley 07/09/1945, MRN [id number]  PCP:  Margaree Mackintosh, MD  Cardiologist:   Rollene Rotunda, MD   Chief Complaint  Patient presents with  . persistent atrial fib      History of Present Illness: Ebony Ashley is a 75 y.o. female who presents for follow up of atrial fib.   She has done well.  She is limited by knee pain.  She gets around as her groceries.  She never notices her fibrillation. The patient denies any new symptoms such as chest discomfort, neck or arm discomfort. There has been no new shortness of breath, PND or orthopnea. There have been no reported palpitations, presyncope or syncope.    Past Medical History:  Diagnosis Date  . Atrial fibrillation (HCC)   . Diabetes mellitus   . Esophageal spasm   . Esophagitis   . Hiatal hernia   . Hypertension     Past Surgical History:  Procedure Laterality Date  . ABDOMINAL HYSTERECTOMY  1990   DUB, ovaries remain  Dr. Laurena Bering, off ERT 4/11  . TONSILLECTOMY AND ADENOIDECTOMY  1951     Current Outpatient Medications  Medication Sig Dispense Refill  . ALPRAZolam (XANAX) 0.5 MG tablet Take 0.5 mg by mouth at bedtime as needed for anxiety.    Marland Kitchen amLODipine (NORVASC) 10 MG tablet TAKE 1 TABLET BY MOUTH EVERY DAY 90 tablet 1  . apixaban (ELIQUIS) 5 MG TABS tablet Take 1 tablet (5 mg total) by mouth 2 (two) times daily. 60 tablet 11  . Esomeprazole Magnesium (NEXIUM PO) Take by mouth as needed.    Marland Kitchen glipiZIDE (GLUCOTROL XL) 2.5 MG 24 hr tablet TAKE 1 TABLET (2.5 MG TOTAL) BY MOUTH DAILY WITH BREAKFAST. 90 tablet 0  . JANUVIA 100 MG tablet TAKE 1 TABLET (100 MG TOTAL) BY MOUTH DAILY. DISCONTINUE JANUVIA 50MG . 90 tablet 1  . meclizine (ANTIVERT) 25 MG tablet Take 25 mg by mouth 2 (two) times daily as needed for dizziness.    . metFORMIN (GLUCOPHAGE) 850 MG tablet TAKE 1 TABLET BY MOUTH EVERY DAY 90 tablet 3  . metoprolol succinate (TOPROL-XL) 100 MG 24 hr  tablet Take with or immediately following a meal. 90 tablet 3  . nitroGLYCERIN (NITROSTAT) 0.4 MG SL tablet Place 0.4 mg under the tongue every 5 (five) minutes as needed.    . rosuvastatin (CRESTOR) 5 MG tablet TAKE 1 TABLET (5 MG TOTAL) BY MOUTH 3 (THREE) TIMES A WEEK. MONDAYS, WEDNESDAYS AND FRIDAYS. 36 tablet 3  . solifenacin (VESICARE) 10 MG tablet Take 5 mg by mouth daily.    . traMADol (ULTRAM) 50 MG tablet TAKE 1 TABLET (50 MG TOTAL) BY MOUTH EVERY 8 (EIGHT) HOURS AS NEEDED. *MAX 7 DAYS 21 tablet 0  . triamcinolone (KENALOG) 0.1 % APPLY TO AFFECTED AREA TWICE A DAY 45 g 2   No current facility-administered medications for this visit.    Allergies:   Adhesive [tape] and Polysporin [bacitracin-polymyxin b]    ROS:  Please see the history of present illness.   Otherwise, review of systems are positive for none.   All other systems are reviewed and negative.    PHYSICAL EXAM: VS:  BP (!) 122/94 (BP Location: Right Wrist, Patient Position: Sitting, Cuff Size: Normal)   Pulse 89   Ht 5\' 4"  (1.626 m)   Wt 273 lb (123.8 kg)   LMP 01/05/1988 (Approximate)   BMI  46.86 kg/m  , BMI Body mass index is 46.86 kg/m. GENERAL:  Well appearing NECK:  No jugular venous distention, waveform within normal limits, carotid upstroke brisk and symmetric, no bruits, no thyromegaly LUNGS:  Clear to auscultation bilaterally CHEST:  Unremarkable HEART:  PMI not displaced or sustained,S1 and S2 within normal limits, no S3, no clicks, no rubs, no murmurs, irregular ABD:  Flat, positive bowel sounds normal in frequency in pitch, no bruits, no rebound, no guarding, no midline pulsatile mass, no hepatomegaly, no splenomegaly EXT:  2 plus pulses throughout, no edema, no cyanosis no clubbing   EKG:  EKG is ordered today. The ekg ordered today demonstrates atrial fibrillation, rate 89, axis within normal limits, intervals within normal limits, no acute ST-T wave changes.   Recent Labs: No results found for  requested labs within last 8760 hours.    Lipid Panel    Component Value Date/Time   CHOL 139 03/20/2019 0940   TRIG 106 03/20/2019 0940   HDL 41 (L) 03/20/2019 0940   CHOLHDL 3.4 03/20/2019 0940   VLDL 23 04/08/2015 0904   LDLCALC 79 03/20/2019 0940      Wt Readings from Last 3 Encounters:  03/27/20 273 lb (123.8 kg)  10/09/19 262 lb (118.8 kg)  08/08/19 266 lb (120.7 kg)      Other studies Reviewed: Additional studies/ records that were reviewed today include: Labs. Review of the above records demonstrates:  Please see elsewhere in the note.     ASSESSMENT AND PLAN:  ATRIAL FIB:   Ebony Ashley has a CHA2DS2 - VASc score of 5.   She tolerates anticoagulation or rate control.  We will try to get a monitor for her but somehow got lost in the mail so we decided she will follow her heart rate using pulse oximeter that she has at home.  No change in therapy.  LVH: This was mild on echo.  There were no significant valvular abnormalities.  No change in therapy.   OBESITY: She is limited by knee pain and she is not planning to have this treated.  She probably could do exercise on a machine that she has in her house she just needs to have it moved and we talked about having her son do this before Easter.  Current medicines are reviewed at length with the patient today.  The patient does not have concerns regarding medicines.  The following changes have been made:  no change  Labs/ tests ordered today include: None  Orders Placed This Encounter  Procedures  . EKG 12-Lead     Disposition:   FU with me in one year.     Signed, Rollene Rotunda, MD  03/27/2020 12:26 PM    [facility]

## 2020-03-27 ENCOUNTER — Other Ambulatory Visit: Payer: Self-pay

## 2020-03-27 ENCOUNTER — Encounter: Payer: Self-pay | Admitting: Cardiology

## 2020-03-27 ENCOUNTER — Ambulatory Visit: Payer: HMO | Admitting: Cardiology

## 2020-03-27 VITALS — BP 122/94 | HR 89 | Ht 64.0 in | Wt 273.0 lb

## 2020-03-27 DIAGNOSIS — I4819 Other persistent atrial fibrillation: Secondary | ICD-10-CM | POA: Diagnosis not present

## 2020-03-27 DIAGNOSIS — I517 Cardiomegaly: Secondary | ICD-10-CM | POA: Diagnosis not present

## 2020-03-27 NOTE — Patient Instructions (Addendum)
Medication Instructions:  The current medical regimen is effective;  continue present plan and medications as directed. Please refer to the Current Medication list given to you today.  *If you need a refill on your cardiac medications before your next appointment, please call your pharmacy*  Lab Work:   Testing/Procedures:  NONE    NONE  Follow-Up: Your next appointment:  12 month(s) In Person with Rollene Rotunda, MD    At [facility], you and your health needs are our priority.  As part of our continuing mission to provide you with exceptional heart care, we have created designated Provider Care Teams.  These Care Teams include your primary Cardiologist (physician) and Advanced Practice Providers (APPs -  Physician Assistants and Nurse Practitioners) who all work together to provide you with the care you need, when you need it.  We recommend signing up for the patient portal called "MyChart".  Sign up information is provided on this After Visit Summary.  MyChart is used to connect with patients for Virtual Visits (Telemedicine).  Patients are able to view lab/test results, encounter notes, upcoming appointments, etc.  Non-urgent messages can be sent to your provider as well.   To learn more about what you can do with MyChart, go to [email or web address].

## 2020-04-07 ENCOUNTER — Other Ambulatory Visit: Payer: Medicare Other | Admitting: Internal Medicine

## 2020-04-10 ENCOUNTER — Other Ambulatory Visit: Payer: Self-pay | Admitting: Internal Medicine

## 2020-04-10 ENCOUNTER — Encounter: Payer: Medicare Other | Admitting: Internal Medicine

## 2020-05-08 ENCOUNTER — Other Ambulatory Visit: Payer: Self-pay | Admitting: Internal Medicine

## 2020-05-08 DIAGNOSIS — I10 Essential (primary) hypertension: Secondary | ICD-10-CM

## 2020-05-22 ENCOUNTER — Ambulatory Visit: Payer: HMO | Admitting: *Deleted

## 2020-05-27 ENCOUNTER — Other Ambulatory Visit: Payer: HMO | Admitting: Internal Medicine

## 2020-05-27 ENCOUNTER — Other Ambulatory Visit: Payer: Self-pay

## 2020-05-27 DIAGNOSIS — Z0000 Encounter for general adult medical examination without abnormal findings: Secondary | ICD-10-CM | POA: Diagnosis not present

## 2020-05-27 DIAGNOSIS — I10 Essential (primary) hypertension: Secondary | ICD-10-CM | POA: Diagnosis not present

## 2020-05-27 DIAGNOSIS — E1169 Type 2 diabetes mellitus with other specified complication: Secondary | ICD-10-CM

## 2020-05-27 DIAGNOSIS — I4891 Unspecified atrial fibrillation: Secondary | ICD-10-CM | POA: Diagnosis not present

## 2020-05-27 DIAGNOSIS — F411 Generalized anxiety disorder: Secondary | ICD-10-CM | POA: Diagnosis not present

## 2020-05-27 DIAGNOSIS — E785 Hyperlipidemia, unspecified: Secondary | ICD-10-CM | POA: Diagnosis not present

## 2020-05-28 LAB — CBC WITH DIFFERENTIAL/PLATELET
Absolute Monocytes: 655 cells/uL (ref 200–950)
Basophils Absolute: 42 cells/uL (ref 0–200)
Basophils Relative: 0.4 %
Eosinophils Absolute: 114 cells/uL (ref 15–500)
Eosinophils Relative: 1.1 %
HCT: 51.5 % — ABNORMAL HIGH (ref 35.0–45.0)
Hemoglobin: 16.7 g/dL — ABNORMAL HIGH (ref 11.7–15.5)
Lymphs Abs: 1362 cells/uL (ref 850–3900)
MCH: 29.7 pg (ref 27.0–33.0)
MCHC: 32.4 g/dL (ref 32.0–36.0)
MCV: 91.6 fL (ref 80.0–100.0)
MPV: 12 fL (ref 7.5–12.5)
Monocytes Relative: 6.3 %
Neutro Abs: 8226 cells/uL — ABNORMAL HIGH (ref 1500–7800)
Neutrophils Relative %: 79.1 %
Platelets: 296 10*3/uL (ref 140–400)
RBC: 5.62 10*6/uL — ABNORMAL HIGH (ref 3.80–5.10)
RDW: 13.5 % (ref 11.0–15.0)
Total Lymphocyte: 13.1 %
WBC: 10.4 10*3/uL (ref 3.8–10.8)

## 2020-05-28 LAB — LIPID PANEL
Cholesterol: 134 mg/dL (ref <200)
HDL: 43 mg/dL — ABNORMAL LOW (ref >=50)
LDL Cholesterol (Calc): 75 mg/dL (calc)
Non-HDL Cholesterol (Calc): 91 mg/dL (calc) (ref <130)
Total CHOL/HDL Ratio: 3.1 (calc) (ref <5.0)
Triglycerides: 77 mg/dL (ref <150)

## 2020-05-28 LAB — COMPLETE METABOLIC PANEL WITH GFR
AG Ratio: 1.9 (calc) (ref 1.0–2.5)
ALT: 13 U/L (ref 6–29)
AST: 15 U/L (ref 10–35)
Albumin: 4.1 g/dL (ref 3.6–5.1)
Alkaline phosphatase (APISO): 125 U/L (ref 37–153)
BUN: 11 mg/dL (ref 7–25)
CO2: 31 mmol/L (ref 20–32)
Calcium: 9.6 mg/dL (ref 8.6–10.4)
Chloride: 102 mmol/L (ref 98–110)
Creat: 0.79 mg/dL (ref 0.60–0.93)
GFR, Est African American: 85 mL/min/{1.73_m2} (ref >=60)
GFR, Est Non African American: 73 mL/min/{1.73_m2} (ref >=60)
Globulin: 2.2 g/dL (calc) (ref 1.9–3.7)
Glucose, Bld: 176 mg/dL — ABNORMAL HIGH (ref 65–99)
Potassium: 5.3 mmol/L (ref 3.5–5.3)
Sodium: 140 mmol/L (ref 135–146)
Total Bilirubin: 0.6 mg/dL (ref 0.2–1.2)
Total Protein: 6.3 g/dL (ref 6.1–8.1)

## 2020-05-28 LAB — TSH: TSH: 1.86 mIU/L (ref 0.40–4.50)

## 2020-05-28 LAB — HEMOGLOBIN A1C
Hgb A1c MFr Bld: 7.8 % of total Hgb — ABNORMAL HIGH (ref <5.7)
Mean Plasma Glucose: 177 mg/dL
eAG (mmol/L): 9.8 mmol/L

## 2020-06-03 ENCOUNTER — Other Ambulatory Visit: Payer: HMO | Admitting: Internal Medicine

## 2020-06-06 ENCOUNTER — Ambulatory Visit (INDEPENDENT_AMBULATORY_CARE_PROVIDER_SITE_OTHER): Payer: HMO | Admitting: Internal Medicine

## 2020-06-06 ENCOUNTER — Encounter: Payer: Self-pay | Admitting: Internal Medicine

## 2020-06-06 ENCOUNTER — Other Ambulatory Visit: Payer: Self-pay

## 2020-06-06 VITALS — BP 120/80 | HR 70 | Ht 64.0 in | Wt 271.0 lb

## 2020-06-06 DIAGNOSIS — Z0000 Encounter for general adult medical examination without abnormal findings: Secondary | ICD-10-CM

## 2020-06-06 NOTE — Progress Notes (Signed)
   Subjective:    Patient ID: Ebony Ashley, female    DOB: 11-07-1945, 75 y.o.   MRN: [id number]  HPI  75 year old Female for Medicare wellness, health maintenance,  and evaluation of medical issues. She was to have had both Medicare wellness and health maintenance exam but despite multiple attempts could not give [place] a urine specimen and had to urgently urinate during the exam. The health maintenance exam is being postponed until a later date. The Medicare wellness exam was performed  today and is in a separate note.    Review of Systems not done     Objective:   Physical Exam   See above      Assessment & Plan:

## 2020-06-16 ENCOUNTER — Telehealth: Payer: Self-pay | Admitting: Internal Medicine

## 2020-06-16 NOTE — Telephone Encounter (Signed)
Ebony Ashley [phone]  Barney called back to see when she needs to come back to finish the appointment from 06/06/2020. Since she was having trouble giving specimen that day. We went ahead and completed the Annual Medicare Wellness.

## 2020-06-17 ENCOUNTER — Encounter: Payer: Self-pay | Admitting: Internal Medicine

## 2020-06-17 NOTE — Progress Notes (Signed)
Subjective:    Patient ID: Ebony Ashley, female    DOB: 06-Dec-1945, 75 y.o.   MRN: [id number]  HPI 75 year old Female seen for Medicare wellness visit.  She resides alone.  She is a widow.  She does have a life alert device and is able to find.  No recent falls.  We were going to do her health maintenance exam today but time did not permit this.  She will have to be rescheduled.  She had difficulty giving a urine specimen today and time limit for the appointment was exceeded.  She has a history of diabetes mellitus, hypertension and hyperlipidemia.  History of atrial fibrillation.  She is on chronic anticoagulation with Eliquis per Dr. Antoine Poche.  Has tramadol to take sparingly for musculoskeletal pain.  She is on Crestor 5 mg 3 times a week for hyperlipidemia.  History of urge urinary incontinence  History of GE reflux treated with Nexium as needed    Review of Systems see above     Objective:   Physical Exam Patient was not examined today.  Medicare wellness visit performed.  Physical exam will be rescheduled       Assessment & Plan:  Essential hypertension  Hyperlipidemia  Type 2 diabetes mellitus  Atrial fibrillation on chronic anticoagulation  Obesity  Urge urinary incontinence  Plan: Reschedule annual physical exam at a later time.  Proceed with Medicare wellness visit today.   Subjective:   Patient presents for Medicare Annual/Subsequent preventive examination.  Review Past Medical/Family/Social: See previous health maintenance exam last year.  No changes.   Risk Factors  Current exercise habits: Sedentary Dietary issues discussed: Yes  Cardiac risk factors: Hyperlipidemia and diabetes  Depression Screen  (Note: if answer to either of the following is "Yes", a more complete depression screening is indicated)   Over the past two weeks, have you felt down, depressed or hopeless? No  Over the past two weeks, have you felt little interest or  pleasure in doing things? No Have you lost interest or pleasure in daily life? No Do you often feel hopeless? No Do you cry easily over simple problems? No   Activities of Daily Living  In your present state of health, do you have any difficulty performing the following activities?:   Driving? No  Managing money? No  Feeding yourself? No  Getting from bed to chair? No  Climbing a flight of stairs? No  Preparing food and eating?: No  Bathing or showering? No  Getting dressed: No  Getting to the toilet? No  Using the toilet:No  Moving around from place to place: No  In the past year have you fallen or had a near fall?:No  Are you sexually active? No  Do you have more than one partner? No   Hearing Difficulties: No  Do you often ask people to speak up or repeat themselves? No  Do you experience ringing or noises in your ears? No  Do you have difficulty understanding soft or whispered voices? No  Do you feel that you have a problem with memory? No Do you often misplace items? No    Home Safety:  Do you have a smoke alarm at your residence?  No Do you have grab bars in the bathroom?  No Do you have throw rugs in your house?  None   Cognitive Testing  Alert? Yes Normal Appearance?Yes  Oriented to person? Yes Place? Yes  Time? Yes  Recall of three objects?  Not  tested Can perform simple calculations? Yes  Displays appropriate judgment?Yes  Can read the correct time from a watch face?Yes   List the Names of Other Physician/Practitioners you currently use:  See referral list for the physicians patient is currently seeing.  Dr. Antoine Poche, Cardiology   Review of Systems: See above   Objective:     General appearance: Appears stated age and obese  Head: Normocephalic, without obvious abnormality, atraumatic  Eyes: conj clear, EOMi PEERLA  Ears: normal TM's and external ear canals both ears  Nose: Nares normal. Septum midline. Mucosa normal. No drainage or sinus  tenderness.  Throat: lips, mucosa, and tongue normal; teeth and gums normal  Neck: no adenopathy, no carotid bruit, no JVD, supple, symmetrical, trachea midline and thyroid not enlarged, symmetric, no tenderness/mass/nodules  No CVA tenderness.  Lungs: clear to auscultation bilaterally  Breasts: normal appearance, no masses or tenderness, top of the pacemaker on left upper chest. Incision well-healed. It is tender.  Heart: Irregular rhythm Abdomen: soft, non-tender; bowel sounds normal; no masses, no organomegaly  Musculoskeletal: ROM normal in all joints, no crepitus, no deformity, Normal muscle strengthen. Back  is symmetric, no curvature. Skin: Skin color, texture, turgor normal. No rashes or lesions  Lymph nodes: Cervical, supraclavicular, and axillary nodes normal.  Neurologic: CN 2 -12 Normal, Normal symmetric reflexes. Normal coordination and gait  Psych: Alert & Oriented x 3, Mood appear stable.    Assessment:    Annual wellness medicare exam   Plan:    During the course of the visit the patient was educated and counseled about appropriate screening and preventive services including:  Needs to get COVID booster  Pneumococcal vaccines up-to-date  Tetanus immunization up-to-date  Has annual flu vaccine      Patient Instructions (the written plan) was given to the patient.  Medicare Attestation  I have personally reviewed:  The patient's medical and social history  Their use of alcohol, tobacco or illicit drugs  Their current medications and supplements  The patient's functional ability including ADLs,fall risks, home safety risks, cognitive, and hearing and visual impairment  Diet and physical activities  Evidence for depression or mood disorders  The patient's weight, height, BMI, and visual acuity have been recorded in the chart. I have made referrals, counseling, and provided education to the patient based on review of the above and I have provided the patient with  a written personalized care plan for preventive services.

## 2020-06-17 NOTE — Telephone Encounter (Signed)
Scheduled

## 2020-06-17 NOTE — Patient Instructions (Signed)
Medicare wellness exam completed today.  Plan is to find time to do her health maintenance exam and evaluation of medical issues sometime in the near future.

## 2020-06-22 NOTE — Patient Instructions (Signed)
Postpone health maintenance exam until another date. Need urine specimen with next visit.

## 2020-06-25 ENCOUNTER — Ambulatory Visit (INDEPENDENT_AMBULATORY_CARE_PROVIDER_SITE_OTHER): Payer: HMO | Admitting: Internal Medicine

## 2020-06-25 ENCOUNTER — Other Ambulatory Visit: Payer: Self-pay

## 2020-06-25 ENCOUNTER — Encounter: Payer: Self-pay | Admitting: Internal Medicine

## 2020-06-25 VITALS — BP 110/80 | HR 86 | Ht 64.0 in | Wt 270.0 lb

## 2020-06-25 DIAGNOSIS — E1169 Type 2 diabetes mellitus with other specified complication: Secondary | ICD-10-CM | POA: Diagnosis not present

## 2020-06-25 DIAGNOSIS — Z0000 Encounter for general adult medical examination without abnormal findings: Secondary | ICD-10-CM

## 2020-06-25 DIAGNOSIS — I10 Essential (primary) hypertension: Secondary | ICD-10-CM

## 2020-06-25 DIAGNOSIS — Z9181 History of falling: Secondary | ICD-10-CM | POA: Diagnosis not present

## 2020-06-25 DIAGNOSIS — E782 Mixed hyperlipidemia: Secondary | ICD-10-CM

## 2020-06-25 DIAGNOSIS — I4891 Unspecified atrial fibrillation: Secondary | ICD-10-CM | POA: Diagnosis not present

## 2020-06-25 DIAGNOSIS — I272 Pulmonary hypertension, unspecified: Secondary | ICD-10-CM | POA: Diagnosis not present

## 2020-06-25 DIAGNOSIS — K219 Gastro-esophageal reflux disease without esophagitis: Secondary | ICD-10-CM

## 2020-06-25 DIAGNOSIS — N3281 Overactive bladder: Secondary | ICD-10-CM | POA: Diagnosis not present

## 2020-06-25 DIAGNOSIS — N3941 Urge incontinence: Secondary | ICD-10-CM | POA: Diagnosis not present

## 2020-06-25 DIAGNOSIS — Z8659 Personal history of other mental and behavioral disorders: Secondary | ICD-10-CM | POA: Diagnosis not present

## 2020-06-25 DIAGNOSIS — E785 Hyperlipidemia, unspecified: Secondary | ICD-10-CM

## 2020-06-25 LAB — POCT URINALYSIS DIPSTICK
Appearance: NEGATIVE
Bilirubin, UA: NEGATIVE
Blood, UA: NEGATIVE
Glucose, UA: NEGATIVE
Ketones, UA: NEGATIVE
Leukocytes, UA: NEGATIVE
Nitrite, UA: NEGATIVE
Odor: NEGATIVE
Protein, UA: NEGATIVE
Spec Grav, UA: 1.015 (ref 1.010–1.025)
Urobilinogen, UA: 0.2 E.U./dL
pH, UA: 6.5 (ref 5.0–8.0)

## 2020-06-25 MED ORDER — GLIPIZIDE ER 5 MG PO TB24: 5.0000 mg | ORAL_TABLET | Freq: Every day | ORAL | 0 refills | Status: DC

## 2020-06-25 NOTE — Progress Notes (Addendum)
Subjective:    Patient ID: Ebony Ashley, female    DOB: Jul 10, 1945, 75 y.o.   MRN: [id number]  HPI 75 year old Female seen for health maintenance exam and evaluation of medical issues.  Medicare Wellness exam was done previously on June 3.  This exam was postponed as she was having some difficulty obtaining urine specimen and we were running out of time with the appointment.  She has type 2 diabetes mellitus, hypertension, esophageal spasm and esophagitis.  History of morbid obesity and overactive bladder.  In 2021 was found to have atrial fibrillation (new onset) was referred to Cardiology.  She was unaware of her arrhythmia.  Was placed on Eliquis.  2D echocardiogram showed normal left ventricular function, mild left atrial enlargement and moderate pulmonary hypertension.    Past medical history: Fractured left lower leg 1998.  Total abdominal hysterectomy without oophorectomy for menorrhagia in 1991.  She has had 2 D&C procedures in the past prior to her hysterectomy.  Colonoscopy 2009 by Dr. Loreta Ave.  Left cataract extraction January 2021.  Right cataract extraction February 2021.  Has been seen at [facility].  Has tried numerous medications for urge urinary incontinence.  Failed Ditropan and Myrbetriq.  Subsequently tried on Vesicare.  History of apical systolic murmur noted by Dr. Antoine Poche in 2013.  Social history: She does not smoke.  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  She is now retired.  1 adult daughter who lives out of state and 1 adult son who lives here.  Husband is deceased.  She resides alone.  She has a Life alert device in case she falls.  She did suffer a fall a few years ago and it was sometime before she was able to get some help.  That prompted her family to get her a Life alert device.  Family history: Mother died with colon  cancer at age 59.  Father died at age 9 in an automobile accident.  Stepbrother died at age 46 of a stroke.  No sisters.   Review of Systems has been mildly depressed since her husband died.     Objective:   Physical Exam  Blood pressure 110/80 pulse 86 pulse oximetry 96% weight 270 pounds BMI 46.35  Skin: Warm and dry.  No cervical adenopathy.  TMs clear.  Neck is without carotid bruits.  No thyromegaly.  No adenopathy.  Chest clear to auscultation.  Cardiac exam: 2/6 systolic ejection murmur longstanding.  Irregular contractions consistent with atrial fibrillation abdomen obese soft nondistended without hepatosplenomegaly masses or tenderness.  Bimanual deferred; trace lower extremity edema.  Brief neurological exam intact without focal deficits.  She is alert and oriented.      Assessment & Plan:  Morbid obesity BMI 46.35-not motivated to diet and exercise  History of mild depression since the death of her husband  Overactive bladder-seen and evaluated by urology-not  History of atrial fibrillation maintained on chronic anticoagulation with Eliquis  History of systolic murmur with 2D echocardiogram previously showing LVH and mildly dilated left atrium  Type 2 diabetes mellitus currently treated with Januvia and glipizide  She is a fall risk and has a life alert device  Essential hypertension stable on current regimen of amlodipine and metoprolol  Anxiety treated with Xanax at bedtime  GE reflux treated with generic Nexium  Family history of colon cancer-Cologuard negative in 2021  History of mixed hyperlipidemia-total cholesterol, triglycerides and LDL cholesterol have normalized on Crestor 5 mg 3 times a week  Plan: Her labs are stable at the present time.  Has not fallen since her initial fall requiring her to get a life alert device because she was on the floor for several hours and could not reach anyone.  She knows to be careful.  She continues to reside alone but  family checks with her frequently.  Atrial fibs is stable at this time.  There is no evidence of congestive heart failure.  She will continue with GE reflux medication.  Cologuard screen was negative in 2021.  Has not had mammogram since 2020.  This will be ordered.  This was previously ordered through GYN office but she has not been there since 2020 so this was ordered  at [facility].  I would like for her to work on her diet.  Hemoglobin A1c has increased to 7.8%.  Was 7% in October 2021.  She is not motivated to exercise and has difficulty ambulating due to morbid obesity.  She will return in 4 months for Hemoglobin A1c and office visit.

## 2020-07-06 ENCOUNTER — Other Ambulatory Visit: Payer: Self-pay | Admitting: Internal Medicine

## 2020-07-11 ENCOUNTER — Other Ambulatory Visit: Payer: Self-pay | Admitting: Internal Medicine

## 2020-07-27 ENCOUNTER — Encounter: Payer: Self-pay | Admitting: Internal Medicine

## 2020-07-27 DIAGNOSIS — E1169 Type 2 diabetes mellitus with other specified complication: Secondary | ICD-10-CM | POA: Insufficient documentation

## 2020-07-27 NOTE — Addendum Note (Signed)
Addended by: Margaree Mackintosh on: 07/27/2020 06:14 PM   Modules accepted: Orders, Level of Service

## 2020-07-27 NOTE — Patient Instructions (Addendum)
Please work on diet.  Hemoglobin A1c has increased to 7.8%.  Return in October for follow-up on diabetes.  Continue current medications. Recommend Covid booster.

## 2020-08-11 ENCOUNTER — Other Ambulatory Visit: Payer: Self-pay | Admitting: Internal Medicine

## 2020-08-11 ENCOUNTER — Other Ambulatory Visit: Payer: Self-pay | Admitting: Cardiology

## 2020-08-11 DIAGNOSIS — I4891 Unspecified atrial fibrillation: Secondary | ICD-10-CM

## 2020-08-11 NOTE — Telephone Encounter (Signed)
Prescription refill request for Eliquis received. Indication:atrial fib Last office visit:3/22 Scr:0.7 Age: 75 Weight:122.5 kg  Prescription refilled

## 2020-09-19 ENCOUNTER — Other Ambulatory Visit: Payer: Self-pay | Admitting: Internal Medicine

## 2020-09-19 DIAGNOSIS — N6320 Unspecified lump in the left breast, unspecified quadrant: Secondary | ICD-10-CM

## 2020-09-22 ENCOUNTER — Encounter: Payer: HMO | Admitting: Internal Medicine

## 2020-10-16 ENCOUNTER — Other Ambulatory Visit: Payer: Self-pay

## 2020-10-16 ENCOUNTER — Other Ambulatory Visit (INDEPENDENT_AMBULATORY_CARE_PROVIDER_SITE_OTHER): Payer: HMO | Admitting: Internal Medicine

## 2020-10-16 ENCOUNTER — Ambulatory Visit: Payer: HMO | Admitting: Internal Medicine

## 2020-10-16 ENCOUNTER — Encounter: Payer: Self-pay | Admitting: Internal Medicine

## 2020-10-16 DIAGNOSIS — E119 Type 2 diabetes mellitus without complications: Secondary | ICD-10-CM | POA: Diagnosis not present

## 2020-10-16 DIAGNOSIS — Z23 Encounter for immunization: Secondary | ICD-10-CM | POA: Diagnosis not present

## 2020-10-16 NOTE — Patient Instructions (Signed)
Flu vaccine per CMA 

## 2020-10-16 NOTE — Progress Notes (Signed)
Patient obtained flu shot as well.

## 2020-10-16 NOTE — Progress Notes (Signed)
FLU SHOT GIVEN

## 2020-10-17 LAB — HEMOGLOBIN A1C
Hgb A1c MFr Bld: 7.7 % of total Hgb — ABNORMAL HIGH (ref <5.7)
Mean Plasma Glucose: 174 mg/dL
eAG (mmol/L): 9.7 mmol/L

## 2020-10-21 ENCOUNTER — Other Ambulatory Visit: Payer: Self-pay

## 2020-10-21 ENCOUNTER — Ambulatory Visit (INDEPENDENT_AMBULATORY_CARE_PROVIDER_SITE_OTHER): Payer: HMO | Admitting: Internal Medicine

## 2020-10-21 VITALS — BP 130/78 | HR 92 | Temp 98.2°F | Resp 17 | Ht 64.0 in | Wt 272.0 lb

## 2020-10-21 DIAGNOSIS — E1169 Type 2 diabetes mellitus with other specified complication: Secondary | ICD-10-CM

## 2020-10-21 DIAGNOSIS — E785 Hyperlipidemia, unspecified: Secondary | ICD-10-CM | POA: Diagnosis not present

## 2020-10-21 DIAGNOSIS — I10 Essential (primary) hypertension: Secondary | ICD-10-CM

## 2020-10-21 DIAGNOSIS — I4891 Unspecified atrial fibrillation: Secondary | ICD-10-CM

## 2020-10-21 MED ORDER — TRAMADOL HCL 50 MG PO TABS: ORAL_TABLET | ORAL | 0 refills | Status: DC

## 2020-10-21 MED ORDER — GLIPIZIDE ER 10 MG PO TB24: 10.0000 mg | ORAL_TABLET | Freq: Every day | ORAL | 0 refills | Status: DC

## 2020-10-21 MED ORDER — AMLODIPINE BESYLATE 10 MG PO TABS: 10.0000 mg | ORAL_TABLET | Freq: Every day | ORAL | 1 refills | Status: DC

## 2020-10-21 MED ORDER — METFORMIN HCL 850 MG PO TABS: ORAL_TABLET | ORAL | 3 refills | Status: DC

## 2020-10-21 NOTE — Progress Notes (Signed)
    Subjective:    Patient ID: Ebony Ashley , female    DOB: 1945-01-27, 75 y.o.    MRN: [id number]   75 y.o. female presents today for : Chief Complaint  Patient presents with   Diabetes    Pt here for 4 month follow up no concerns    Knee Pain    Pt has chronic knee pain and would like her tramadol refilled    Hypertension    Due for refills     In May her hemoglobin A1c was 7.8% and had been 7% in October 2021.  Highest A1c in the past few years was 8.1% in the spring and also in the fall 2020.  She is on Januvia.  She is on Glucophage 850 mg daily.  She is on glipizide currently XL 5 mg daily.  Glipizide has been increased to 10 mg XL daily.  Explained to her that short of going on stronger medication we had no further options after increasing this dose of glipizide.  She may need endocrinology consultation.  I am not sure she understood that she could very well be facing insulin injections if not watching her diet more closely.  She does not get a lot of exercise.  She is markedly overweight.  Has osteoarthritis issues.  She takes tramadol for pain sparingly and this was refilled.  Regarding hypertension-no changes were made in her medications today. She is on chronic anticoagulation consisting of Eliquis 5 mg daily.  She takes metoprolol and amlodipine for hypertension  She is on Crestor for hyperlipidemia.  Her lipid panel in May was normal except for low HDL of 43    ROS see above-no new complaints      Objective:   Vitals:   10/21/20 1047  BP: 130/78  Pulse: 92  Resp: 17  Temp: 98.2 F (36.8 C)  SpO2: 97%     Physical Exam  Neck is supple without JVD thyromegaly or carotid bruits.  Chest is clear to auscultation.  Cardiac exam: Regular rate and rhythm.  Trace lower extremity edema that is pitting.     Assessment & Plan:  Poorly controlled diabetes mellitus-have increased glipizide XL to 10 mg daily.  She will return in early December.  If not improving,  she will be referred to endocrinology  Essential hypertension-stable on current regimen  Osteoarthritis of her knees-treated sparingly with tramadol.  Can be referred to orthopedist if tramadol not working.  Health maintenance: Recommend patient get COVID booster.  History of dysrhythmia for which she is on Eliquis.  Reminded to take this twice daily.  Plan: Follow-up in 6 weeks with hemoglobin A1c and office visit.    Margaree Mackintosh, MD

## 2020-10-21 NOTE — Patient Instructions (Addendum)
Be sure and take Eliquis twice daily for heart arrhythmia. Increase Glipizide XL to 10 mg daily and follow up in 6 weeks with Hgb AIC and OV. We can refer you to Ortho for knee pain if you desire. Have Covid Booster.

## 2020-10-22 ENCOUNTER — Ambulatory Visit: Payer: HMO

## 2020-10-27 ENCOUNTER — Ambulatory Visit
Admission: RE | Admit: 2020-10-27 | Discharge: 2020-10-27 | Disposition: A | Discharge status: Home / Self Care | Payer: HMO | Source: Ambulatory Visit | Attending: Internal Medicine | Admitting: Internal Medicine

## 2020-10-27 ENCOUNTER — Other Ambulatory Visit: Payer: Self-pay

## 2020-10-27 DIAGNOSIS — N6320 Unspecified lump in the left breast, unspecified quadrant: Secondary | ICD-10-CM

## 2020-10-27 DIAGNOSIS — R922 Inconclusive mammogram: Secondary | ICD-10-CM | POA: Diagnosis not present

## 2020-11-02 ENCOUNTER — Encounter: Payer: Self-pay | Admitting: Internal Medicine

## 2020-11-20 ENCOUNTER — Other Ambulatory Visit: Payer: Self-pay | Admitting: Internal Medicine

## 2020-11-27 ENCOUNTER — Other Ambulatory Visit: Payer: Self-pay | Admitting: Internal Medicine

## 2020-12-02 ENCOUNTER — Other Ambulatory Visit: Payer: HMO | Admitting: Internal Medicine

## 2020-12-02 ENCOUNTER — Other Ambulatory Visit: Payer: Self-pay

## 2020-12-02 DIAGNOSIS — E119 Type 2 diabetes mellitus without complications: Secondary | ICD-10-CM | POA: Diagnosis not present

## 2020-12-03 LAB — HEMOGLOBIN A1C
Hgb A1c MFr Bld: 8.3 % of total Hgb — ABNORMAL HIGH (ref <5.7)
Mean Plasma Glucose: 192 mg/dL
eAG (mmol/L): 10.6 mmol/L

## 2020-12-05 ENCOUNTER — Telehealth: Payer: Self-pay | Admitting: Internal Medicine

## 2020-12-05 ENCOUNTER — Ambulatory Visit (INDEPENDENT_AMBULATORY_CARE_PROVIDER_SITE_OTHER): Payer: HMO | Admitting: Internal Medicine

## 2020-12-05 ENCOUNTER — Other Ambulatory Visit: Payer: Self-pay

## 2020-12-05 ENCOUNTER — Encounter: Payer: Self-pay | Admitting: Internal Medicine

## 2020-12-05 VITALS — BP 120/84 | HR 80 | Ht 65.0 in | Wt 280.0 lb

## 2020-12-05 DIAGNOSIS — E1165 Type 2 diabetes mellitus with hyperglycemia: Secondary | ICD-10-CM | POA: Diagnosis not present

## 2020-12-05 NOTE — Telephone Encounter (Signed)
Ebony Ashley [phone]  Ahmaya called back to say she has been taking her old medication of 5 mg, and she does have the 10 mg, so she will start taking the 10 mg now.  glipiZIDE (GLUCOTROL XL) 10 MG 24 hr tablet

## 2020-12-05 NOTE — Progress Notes (Signed)
   Subjective:    Patient ID: Ebony Ashley, female    DOB: [birth year], 75 y.o.   MRN: [id number]  HPI 75 year old Female seen for follow-up on diabetes mellitus.  In October hemoglobin A1c was 7.7% and had been 7.8% in May.  She is supposed to be taking Glucotrol XL 10 mg daily.  She is not sure if she is taking 5 or 10 mg.  It is frustrating because she does not remember what dose of medication she is taking and does not seem real motivated to get her A1c down.  Her last visit here was October 18 and at that time we increased glipizide from 5 to 10 mg XL daily.  She lives alone.  She feels fine.  She is also on metformin 850 mg daily and is on Januvia 100 mg daily.  She has osteoarthritis of her knees and takes tramadol sparingly.  She has a history of hyperlipidemia and is on low-dose Crestor.  She is on amlodipine for hypertension.  She is on Eliquis per Dr. Antoine Poche for atrial fibrillation.  She had flu vaccine in October.  She had COVID booster in October.  Has had pneumococcal vaccines and tetanus immunization is up-to-date.  Her daughter who is a Engineer, civil (consulting) lives out of state.  Her son lives here in town.    Review of Systems see above-no reported recent falls     Objective:   Physical Exam Blood pressure 120/84 pulse 80 pulse oximetry 97% weight 280 pounds height 5 feet 5 inches BMI 46.59  Skin: Warm and dry.  Nodes none.  Chest clear.  Cardiac exam: Irregular irregular rhythm.  Trace lower extremity edema.  Hemoglobin A1c is 8.3% and previously was 7.7% in October when the change was made in her glipizide dose.  Obviously not following a strict low calorie diet.       Assessment & Plan:  Type 2 diabetes mellitus-poorly controlled on glipizide XL 5 mg daily.  Was supposed to have increased to 10 mg at last visit but apparently did not do so.  Hemoglobin A1c now 8.3%.  Plan: Patient agrees to take glipizide XL 10 mg daily.  Her health maintenance exam is due in June 2023.   Would like to follow-up with hemoglobin A1c before that time.  She is also taking Januvia 100 mg daily.  Continue chronic anticoagulation for atrial fibrillation per Dr. Antoine Poche.  Continue amlodipine for hypertension  Continue low-dose Crestor for hyperlipidemia.

## 2020-12-17 DIAGNOSIS — E119 Type 2 diabetes mellitus without complications: Secondary | ICD-10-CM | POA: Diagnosis not present

## 2020-12-17 DIAGNOSIS — H04123 Dry eye syndrome of bilateral lacrimal glands: Secondary | ICD-10-CM | POA: Diagnosis not present

## 2020-12-17 DIAGNOSIS — H35372 Puckering of macula, left eye: Secondary | ICD-10-CM | POA: Diagnosis not present

## 2020-12-17 DIAGNOSIS — H26491 Other secondary cataract, right eye: Secondary | ICD-10-CM | POA: Diagnosis not present

## 2020-12-17 LAB — HM DIABETES EYE EXAM

## 2020-12-18 ENCOUNTER — Other Ambulatory Visit: Payer: Self-pay | Admitting: Internal Medicine

## 2021-01-05 NOTE — Patient Instructions (Signed)
Continue Januvia 100 mg daily.  Please make sure you are taking Glucotrol XL 10 not 5 mg daily.  Continue metformin.  Continue low-dose Crestor (rosuvastatin).  Continue amlodipine for hypertension.  Follow-up June 2023.  May need repeat hemoglobin A1c when you are compliant with this regimen February.

## 2021-02-10 ENCOUNTER — Telehealth: Payer: Self-pay

## 2021-02-10 NOTE — Telephone Encounter (Signed)
scheduled

## 2021-02-10 NOTE — Telephone Encounter (Signed)
Patients right ear is stopped up she thinks with wax. No other symptoms. She would like an appt please.

## 2021-02-11 ENCOUNTER — Other Ambulatory Visit: Payer: Self-pay

## 2021-02-11 ENCOUNTER — Encounter: Payer: Self-pay | Admitting: Internal Medicine

## 2021-02-11 ENCOUNTER — Ambulatory Visit (INDEPENDENT_AMBULATORY_CARE_PROVIDER_SITE_OTHER): Payer: HMO | Admitting: Internal Medicine

## 2021-02-11 VITALS — BP 128/78 | HR 84 | Temp 98.1°F | Wt 229.5 lb

## 2021-02-11 DIAGNOSIS — I4891 Unspecified atrial fibrillation: Secondary | ICD-10-CM | POA: Diagnosis not present

## 2021-02-11 DIAGNOSIS — I10 Essential (primary) hypertension: Secondary | ICD-10-CM

## 2021-02-11 DIAGNOSIS — Z7901 Long term (current) use of anticoagulants: Secondary | ICD-10-CM

## 2021-02-11 DIAGNOSIS — H6123 Impacted cerumen, bilateral: Secondary | ICD-10-CM

## 2021-02-11 DIAGNOSIS — E1169 Type 2 diabetes mellitus with other specified complication: Secondary | ICD-10-CM | POA: Diagnosis not present

## 2021-02-11 DIAGNOSIS — E785 Hyperlipidemia, unspecified: Secondary | ICD-10-CM | POA: Diagnosis not present

## 2021-02-11 NOTE — Progress Notes (Signed)
° °  Subjective:    Patient ID: Ebony Ashley, female    DOB: [birth year], 77 y.o.   MRN: [id number]  HPI 76 year old Female seen with decreased hearing right ear. Says she may have wax in her ear. Using Q-tip did not work.  Due for Medicare wellness and CPE this Summer and has appt for July.  Hx of  DM, obesity, HTN, hyperlipidemia, and hx of atrial fibrillation. She is on chronic anticoagulation   with Eliquis by Dr. Antoine Poche.    Review of Systems no fever or ear pain at present.     Objective:   Physical Exam  BP 128/78, pulse 84, T 98.1 degrees, Pulse ox 96% ,weight 229 pounds.  Skin: Warm and dry. Has impacted cerumen both ears which was removed by curettte today with success and no bleeding. TMs are clear and canals were clear after removal of cerumen.No bleeding or infection noted.     Assessment & Plan:  Bilateral impacted cerumen both ears Morbid obesity Diabetes mellitus Chronic anticoagulation for A-fib HTN- stable on meds  Plan: return for this issue as needed. Keep appt for CPE and Medicare wellness visit in July.Continue current meds as prescribed. No meds prescribed today. No evidence of otitis media or externa

## 2021-02-11 NOTE — Patient Instructions (Addendum)
Bilateral impacted cerumen removed by curette today. No bleeding. Return as needed.

## 2021-03-01 ENCOUNTER — Other Ambulatory Visit: Payer: Self-pay | Admitting: Cardiology

## 2021-03-01 DIAGNOSIS — I4891 Unspecified atrial fibrillation: Secondary | ICD-10-CM

## 2021-03-02 NOTE — Telephone Encounter (Signed)
Prescription refill request for Eliquis received. Indication:Afib Last office visit:3/22 Scr:0.7 Age: 76 Weight:104.1 kg  Prescription refilled

## 2021-03-30 ENCOUNTER — Other Ambulatory Visit: Payer: Self-pay | Admitting: Internal Medicine

## 2021-04-03 ENCOUNTER — Encounter: Payer: Self-pay | Admitting: Internal Medicine

## 2021-04-08 ENCOUNTER — Telehealth: Payer: Self-pay | Admitting: Internal Medicine

## 2021-04-08 NOTE — Telephone Encounter (Signed)
Faxed completed questionnaire to [facility] Nelva Nay, PharmD [phone] ? ? ?

## 2021-04-08 NOTE — Telephone Encounter (Signed)
This message was sent via FAXCOM, a product from [employer]. http://www.biscom.com/ ? ?                  -------Fax Transmission Report------- ? ?To:               Recipient at [phone] ?Subject:          FW: Hp Scans ?Result:           The transmission was successful. ?Explanation:      All Pages Ok ?Pages Sent:       17 ?Connect Time:     6 minutes, 3 seconds ?Transmit Time:    04/08/2021 11:27 ?Transfer Rate:    14400 ?Status Code:      0000 ?Retry Count:      0 ?Job Id:           [id number] ?Unique Id:        [id number] ?Fax Line:         61 ?Fax Server:       [id number] ? ? ?

## 2021-04-28 ENCOUNTER — Other Ambulatory Visit: Payer: Self-pay | Admitting: Internal Medicine

## 2021-04-28 DIAGNOSIS — I10 Essential (primary) hypertension: Secondary | ICD-10-CM

## 2021-05-18 ENCOUNTER — Other Ambulatory Visit: Payer: Self-pay | Admitting: Internal Medicine

## 2021-07-02 ENCOUNTER — Ambulatory Visit (INDEPENDENT_AMBULATORY_CARE_PROVIDER_SITE_OTHER): Payer: Medicare HMO | Admitting: Internal Medicine

## 2021-07-02 ENCOUNTER — Encounter: Payer: Self-pay | Admitting: Internal Medicine

## 2021-07-02 ENCOUNTER — Other Ambulatory Visit: Payer: Medicare HMO

## 2021-07-02 VITALS — BP 128/86 | HR 98 | Temp 98.1°F | Resp 12

## 2021-07-02 DIAGNOSIS — R300 Dysuria: Secondary | ICD-10-CM

## 2021-07-02 DIAGNOSIS — E1169 Type 2 diabetes mellitus with other specified complication: Secondary | ICD-10-CM

## 2021-07-02 DIAGNOSIS — N3941 Urge incontinence: Secondary | ICD-10-CM | POA: Diagnosis not present

## 2021-07-02 DIAGNOSIS — N390 Urinary tract infection, site not specified: Secondary | ICD-10-CM | POA: Diagnosis not present

## 2021-07-02 DIAGNOSIS — R5383 Other fatigue: Secondary | ICD-10-CM

## 2021-07-02 DIAGNOSIS — R82998 Other abnormal findings in urine: Secondary | ICD-10-CM | POA: Diagnosis not present

## 2021-07-02 DIAGNOSIS — N3281 Overactive bladder: Secondary | ICD-10-CM | POA: Diagnosis not present

## 2021-07-02 DIAGNOSIS — I10 Essential (primary) hypertension: Secondary | ICD-10-CM

## 2021-07-02 DIAGNOSIS — E119 Type 2 diabetes mellitus without complications: Secondary | ICD-10-CM

## 2021-07-02 LAB — POCT URINALYSIS DIPSTICK
Bilirubin, UA: NEGATIVE
Glucose, UA: NEGATIVE
Ketones, UA: NEGATIVE
Nitrite, UA: POSITIVE
Protein, UA: NEGATIVE
Spec Grav, UA: 1.01 (ref 1.010–1.025)
Urobilinogen, UA: 0.2 E.U./dL
pH, UA: 5 (ref 5.0–8.0)

## 2021-07-02 MED ORDER — NITROFURANTOIN MONOHYD MACRO 100 MG PO CAPS: 100.0000 mg | ORAL_CAPSULE | Freq: Two times a day (BID) | ORAL | 0 refills | Status: DC

## 2021-07-02 NOTE — Progress Notes (Signed)
   Subjective:    Patient ID: Ebony Ashley, female    DOB: [birth year], 76 y.o.   MRN: [id number]  HPI For about a month now, patient has had urinary frequency and urgency to the point incontinence.  She denies fever, chills, dysuria, back pain, nausea or vomiting.  In 2020 she had a similar problem.  Was diagnosed with an E. coli UTI and was treated with Macrobid.  Patient has a history of type 2 diabetes mellitus, hyperlipidemia, hypertension and morbid obesity.  History of overactive bladder.  History of atrial fibrillation on chronic anticoagulation.  She has upcoming physical exam tomorrow.  She was here to have blood drawn today in anticipation of her physical exam tomorrow and tells CMA she thought she had a UTI and was seen today acutely.    Review of Systems see above no fever chills nausea or vomiting.  No back pain.  Has urgency and frequency     Objective:   Physical Exam  Blood pressure 128/86, pulse 98, respiratory rate 12.  Patient is afebrile  No CVA tenderness.  Clean-catch urine specimen shows trace occult blood and large LE.  Nitrite is positive.  Culture was sent.      Assessment & Plan:  Urinary tract infection-has been symptomatic for some time  History of overactive bladder  Type 2 diabetes mellitus  Hypertension  Hyperlipidemia  Morbid obesity  Plan: Start patient on Macrobid 100 mg twice daily for 10 days with culture pending.  We will follow-up with her tomorrow at a time for health maintenance exam and Medicare wellness visit.  She will need follow-up urinary tract infection visit after completion of antibiotic therapy.  Hopefully Macrobid is sensitive to organism if not we can change to another antibiotic.  I will follow this carefully.

## 2021-07-02 NOTE — Patient Instructions (Signed)
Take Macrobid 100 mg twice daily for 10 days pending culture results.  Patient will return tomorrow for her Medicare wellness and health maintenance exam.  Culture sent and she will need follow-up after culture results are back.

## 2021-07-03 ENCOUNTER — Other Ambulatory Visit: Payer: HMO

## 2021-07-03 ENCOUNTER — Encounter: Payer: Self-pay | Admitting: Internal Medicine

## 2021-07-03 ENCOUNTER — Ambulatory Visit (INDEPENDENT_AMBULATORY_CARE_PROVIDER_SITE_OTHER): Payer: Medicare HMO | Admitting: Internal Medicine

## 2021-07-03 VITALS — BP 126/88 | HR 85 | Temp 97.5°F | Ht 61.25 in | Wt 274.0 lb

## 2021-07-03 DIAGNOSIS — I272 Pulmonary hypertension, unspecified: Secondary | ICD-10-CM | POA: Diagnosis not present

## 2021-07-03 DIAGNOSIS — N390 Urinary tract infection, site not specified: Secondary | ICD-10-CM

## 2021-07-03 DIAGNOSIS — Z602 Problems related to living alone: Secondary | ICD-10-CM

## 2021-07-03 DIAGNOSIS — E1169 Type 2 diabetes mellitus with other specified complication: Secondary | ICD-10-CM

## 2021-07-03 DIAGNOSIS — Z1231 Encounter for screening mammogram for malignant neoplasm of breast: Secondary | ICD-10-CM | POA: Diagnosis not present

## 2021-07-03 DIAGNOSIS — B9620 Unspecified Escherichia coli [E. coli] as the cause of diseases classified elsewhere: Secondary | ICD-10-CM

## 2021-07-03 DIAGNOSIS — Z7901 Long term (current) use of anticoagulants: Secondary | ICD-10-CM | POA: Diagnosis not present

## 2021-07-03 DIAGNOSIS — I10 Essential (primary) hypertension: Secondary | ICD-10-CM

## 2021-07-03 DIAGNOSIS — E119 Type 2 diabetes mellitus without complications: Secondary | ICD-10-CM

## 2021-07-03 DIAGNOSIS — E1165 Type 2 diabetes mellitus with hyperglycemia: Secondary | ICD-10-CM

## 2021-07-03 DIAGNOSIS — E785 Hyperlipidemia, unspecified: Secondary | ICD-10-CM

## 2021-07-03 DIAGNOSIS — Z0000 Encounter for general adult medical examination without abnormal findings: Secondary | ICD-10-CM

## 2021-07-03 DIAGNOSIS — I4891 Unspecified atrial fibrillation: Secondary | ICD-10-CM

## 2021-07-03 DIAGNOSIS — N3281 Overactive bladder: Secondary | ICD-10-CM

## 2021-07-03 DIAGNOSIS — N3941 Urge incontinence: Secondary | ICD-10-CM | POA: Diagnosis not present

## 2021-07-03 DIAGNOSIS — E782 Mixed hyperlipidemia: Secondary | ICD-10-CM

## 2021-07-03 DIAGNOSIS — R69 Illness, unspecified: Secondary | ICD-10-CM | POA: Diagnosis not present

## 2021-07-03 DIAGNOSIS — K219 Gastro-esophageal reflux disease without esophagitis: Secondary | ICD-10-CM

## 2021-07-03 DIAGNOSIS — Z9181 History of falling: Secondary | ICD-10-CM

## 2021-07-03 DIAGNOSIS — Z8659 Personal history of other mental and behavioral disorders: Secondary | ICD-10-CM

## 2021-07-03 NOTE — Progress Notes (Signed)
Annual Wellness Visit     Patient: Ebony Ashley, Female    DOB: March 09, 1945, 76 y.o.   MRN: [id number] Visit Date: 07/03/2021  Chief Complaint  Patient presents with   Medicare Wellness   Subjective    Ebony Ashley is a 76 y.o. female who presents today for her Annual Wellness Visit.  HPI She is also here for health maintenance exam and evaluation of medical issues.  On June 29 she was seen for urinary tract infection.  She had had urinary frequency and urgency for a month.  She came in to give a urine specimen and have fasting labs drawn in office staff had her stay to see me for a visit regarding UTI.  Her urine specimen yesterday was abnormal and she was started on Macrobid 100 mg twice daily for 10 days with culture pending.  In 2020 she had a similar problem and was diagnosed with an E. coli UTI treated with Macrobid.  She has a history of type 2 diabetes mellitus, hyperlipidemia, hypertension and morbid obesity.  She has overactive bladder.  History of atrial fibrillation on chronic anticoagulation.  In 2021 she was found to have new onset atrial fibrillation and was referred to cardiology.  She was unaware of her arrhythmia and she was placed on Eliquis.  2D echocardiogram showed normal left ventricular function, mild left atrial enlargement, moderate pulmonary hypertension.  Has been seen in [facility] and is tried numerous medications for urge urinary incontinence.  She failed Ditropan, Myrbetriq and subsequently tried [employer].  Currently not taking any of these  History of apical systolic murmur noted by Dr. Antoine Poche in 2013.  Past medical history: Fractured left lower leg 1998, total abdominal hysterectomy without oophorectomy for menorrhagia in 1991.  She has had 2 D&C procedures in the past prior to her hysterectomy.  Colonoscopy 2009 by Dr. Loreta Ave.  Left cataract extraction January 2021.  Right cataract extraction February 2021.  Social history: She  does not smoke.  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  She is now retired.  1 adult daughter who lives out of state and 1 adult son who lives here.  Husband is deceased.  She resides alone.  She has a life alert device in case she falls.  She did suffer a fall a few years ago and it was sometime before she was able to get some help.  That prompted her family to get her a life alert device.  Family history: Mother died with colon cancer at age 41.  Father died at age 69 in an automobile accident.  Stepbrother died at age 63 of a stroke.  No sisters.  Has seemed to mildly depressed since her husband died.    Patient Care Team: Margaree Mackintosh, MD as PCP - General (Internal Medicine) Rollene Rotunda, MD as PCP - Cardiology (Cardiology)  Review of Systems-denies chest pain, abdominal pain, has osteoarthritis of her knees and deconditioning.  Denies bowel issues.   Objective    Vitals: BP 126/88   Pulse 85   Temp (!) 97.5 F (36.4 C) (Tympanic)   Ht 5' 1.25" (1.556 m)   Wt 274 lb (124.3 kg)   LMP 01/05/1988 (Approximate)   SpO2 97%   BMI 51.35 kg/m   Physical Exam Skin: Warm and dry.  No cervical adenopathy.  No carotid bruits.  No  thyromegaly.  Chest is clear.  Cardiac exam: Irregular irregular rhythm.  Abdomen obese soft nondistended without hepatosplenomegaly masses or tenderness.  No pitting edema of the lower extremities.  Brief neurological exam is intact without gross focal deficits.  Affect thought and judgment appear to be normal.   Most recent functional status assessment:    07/03/2021    9:56 AM  In your present state of health, do you have any difficulty performing the following activities:  Hearing? 0  Vision? 0  Difficulty concentrating or making decisions? 0  Walking or climbing stairs? 1  Dressing or bathing? 0  Doing errands,  shopping? 0  Preparing Food and eating ? N  Using the Toilet? N  In the past six months, have you accidently leaked urine? N  Do you have problems with loss of bowel control? N  Managing your Medications? N  Managing your Finances? N  Housekeeping or managing your Housekeeping? N   Most recent fall risk assessment:    07/03/2021    9:55 AM  Fall Risk   Falls in the past year? 0  Number falls in past yr: 0  Injury with Fall? 0  Risk for fall due to : Impaired balance/gait;Impaired mobility  Follow up Falls evaluation completed    Most recent depression screenings:    07/03/2021    9:56 AM 06/06/2020    3:10 PM  PHQ 2/9 Scores  PHQ - 2 Score 0 0   Most recent cognitive screening:    07/03/2021    9:57 AM  6CIT Screen  What Year? 0 points  What month? 0 points  What time? 0 points  Count back from 20 0 points  Months in reverse 0 points  Repeat phrase 2 points  Total Score 2 points       Assessment & Plan   Chronic atrial fib on chronic anticoagulation-rate is controlled seen by Dr. Antoine Poche.  Had onset around 2021 and is on Eliquis.  Metoprolol helps with rate control in addition to lowering blood pressure.  History of bilateral cataract extraction  Urge urinary incontinence-has failed multiple medications and does not take any of these  Obesity  Deconditioning  Osteoarthritis of her knees  Type 2 diabetes mellitus  Esophageal spasm and history of esophagitis  Morbid obesity  Essential hypertension  Mixed hyperlipidemia treated with statin  Low HDL  Lives alone  E. coli UTI treated with Cipro.  We will recheck July 14.  Plan: Her hemoglobin A1c is stable at 7.8% and may be at her age this is okay.  She is currently on Januvia 100 mg daily, glipizide and metformin 850 mg daily.  She does not want to be on insulin.  For hypertension, she is on metoprolol, amlodipine and low-dose ramipril.  She takes rosuvastatin 5 mg 3 times weekly.  Is on Vesicare  for overactive bladder  For musculoskeletal pain has taken tramadol.  Plan: She will continue current medications and follow-up in 6 months.  UTI has been treated and we will follow-up on July 14    Annual wellness visit done today including the all of the following: Reviewed patient's Family Medical History Reviewed and updated list of patient's medical providers Assessment of cognitive impairment was done Assessed patient's functional ability Established a written schedule for health screening services Health Risk Assessent Completed and Reviewed  Discussed health benefits of physical activity, and encouraged her to engage in regular exercise appropriate for her age and condition.         {  IMargaree Mackintosh, MD, have reviewed all documentation for this visit. The documentation on 07/22/21 for the exam, diagnosis, procedures, and orders are all accurate and complete.   Jama Flavors, CMA

## 2021-07-04 LAB — COMPLETE METABOLIC PANEL WITH GFR
AG Ratio: 1.7 (calc) (ref 1.0–2.5)
ALT: 12 U/L (ref 6–29)
AST: 16 U/L (ref 10–35)
Albumin: 4.2 g/dL (ref 3.6–5.1)
Alkaline phosphatase (APISO): 146 U/L (ref 37–153)
BUN: 10 mg/dL (ref 7–25)
CO2: 28 mmol/L (ref 20–32)
Calcium: 9.5 mg/dL (ref 8.6–10.4)
Chloride: 102 mmol/L (ref 98–110)
Creat: 0.74 mg/dL (ref 0.60–1.00)
Globulin: 2.5 g/dL (calc) (ref 1.9–3.7)
Glucose, Bld: 206 mg/dL — ABNORMAL HIGH (ref 65–99)
Potassium: 4.9 mmol/L (ref 3.5–5.3)
Sodium: 141 mmol/L (ref 135–146)
Total Bilirubin: 0.5 mg/dL (ref 0.2–1.2)
Total Protein: 6.7 g/dL (ref 6.1–8.1)
eGFR: 84 mL/min/{1.73_m2} (ref >=60)

## 2021-07-04 LAB — HEMOGLOBIN A1C
Hgb A1c MFr Bld: 7.8 % of total Hgb — ABNORMAL HIGH (ref <5.7)
Mean Plasma Glucose: 177 mg/dL
eAG (mmol/L): 9.8 mmol/L

## 2021-07-04 LAB — LIPID PANEL
Cholesterol: 152 mg/dL (ref <200)
HDL: 46 mg/dL — ABNORMAL LOW (ref >=50)
LDL Cholesterol (Calc): 90 mg/dL (calc)
Non-HDL Cholesterol (Calc): 106 mg/dL (calc) (ref <130)
Total CHOL/HDL Ratio: 3.3 (calc) (ref <5.0)
Triglycerides: 74 mg/dL (ref <150)

## 2021-07-04 LAB — CBC WITH DIFFERENTIAL/PLATELET
Absolute Monocytes: 578 cells/uL (ref 200–950)
Basophils Absolute: 38 cells/uL (ref 0–200)
Basophils Relative: 0.5 %
Eosinophils Absolute: 76 cells/uL (ref 15–500)
Eosinophils Relative: 1 %
HCT: 49 % — ABNORMAL HIGH (ref 35.0–45.0)
Hemoglobin: 16.2 g/dL — ABNORMAL HIGH (ref 11.7–15.5)
Lymphs Abs: 1102 cells/uL (ref 850–3900)
MCH: 30.2 pg (ref 27.0–33.0)
MCHC: 33.1 g/dL (ref 32.0–36.0)
MCV: 91.4 fL (ref 80.0–100.0)
MPV: 12.2 fL (ref 7.5–12.5)
Monocytes Relative: 7.6 %
Neutro Abs: 5806 cells/uL (ref 1500–7800)
Neutrophils Relative %: 76.4 %
Platelets: 279 10*3/uL (ref 140–400)
RBC: 5.36 10*6/uL — ABNORMAL HIGH (ref 3.80–5.10)
RDW: 13.4 % (ref 11.0–15.0)
Total Lymphocyte: 14.5 %
WBC: 7.6 10*3/uL (ref 3.8–10.8)

## 2021-07-04 LAB — URINE CULTURE
MICRO NUMBER:: 13588854
SPECIMEN QUALITY:: ADEQUATE

## 2021-07-04 LAB — TSH: TSH: 1.74 mIU/L (ref 0.40–4.50)

## 2021-07-04 LAB — MICROALBUMIN, URINE: Microalb, Ur: 9.8 mg/dL

## 2021-07-09 ENCOUNTER — Other Ambulatory Visit: Payer: Self-pay

## 2021-07-09 MED ORDER — CIPROFLOXACIN HCL 500 MG PO TABS: 500.0000 mg | ORAL_TABLET | Freq: Two times a day (BID) | ORAL | 0 refills | Status: AC

## 2021-07-09 MED ORDER — RAMIPRIL 5 MG PO CAPS
5.0000 mg | ORAL_CAPSULE | Freq: Every day | ORAL | 1 refills | Status: DC
Start: 2021-07-09 — End: 2021-10-22

## 2021-07-10 ENCOUNTER — Ambulatory Visit: Payer: HMO | Admitting: Internal Medicine

## 2021-07-16 ENCOUNTER — Telehealth: Payer: Self-pay | Admitting: Internal Medicine

## 2021-07-16 NOTE — Telephone Encounter (Signed)
Faxed signed Chronic Condition Form back to [facility] [phone] or [phone]

## 2021-07-16 NOTE — Telephone Encounter (Signed)
This message was sent via FAXCOM, a product from [employer]. [email or web address].biscom.com/                    -------Fax Transmission Report-------  To:               Recipient at [phone] Subject:          FW: Hp Scans Result:           The transmission was successful. Explanation:      All Pages Ok Pages Sent:       4 Connect Time:     2 minutes, 24 seconds Transmit Time:    07/16/2021 08:44 Transfer Rate:    14400 Status Code:      0000 Retry Count:      0 Job Id:           [id number] Unique Id:        [id number] Fax Line:         34 Fax Server:       [id number]

## 2021-07-17 ENCOUNTER — Encounter: Payer: Self-pay | Admitting: Internal Medicine

## 2021-07-17 ENCOUNTER — Ambulatory Visit (INDEPENDENT_AMBULATORY_CARE_PROVIDER_SITE_OTHER): Payer: PPO | Admitting: Internal Medicine

## 2021-07-17 VITALS — BP 124/86 | HR 94 | Temp 97.5°F

## 2021-07-17 DIAGNOSIS — Z7901 Long term (current) use of anticoagulants: Secondary | ICD-10-CM | POA: Diagnosis not present

## 2021-07-17 DIAGNOSIS — R5381 Other malaise: Secondary | ICD-10-CM

## 2021-07-17 DIAGNOSIS — N3941 Urge incontinence: Secondary | ICD-10-CM | POA: Diagnosis not present

## 2021-07-17 DIAGNOSIS — I4891 Unspecified atrial fibrillation: Secondary | ICD-10-CM | POA: Diagnosis not present

## 2021-07-17 DIAGNOSIS — N390 Urinary tract infection, site not specified: Secondary | ICD-10-CM | POA: Diagnosis not present

## 2021-07-17 DIAGNOSIS — N3281 Overactive bladder: Secondary | ICD-10-CM

## 2021-07-17 LAB — POCT URINALYSIS DIPSTICK
Bilirubin, UA: NEGATIVE
Blood, UA: NEGATIVE
Glucose, UA: NEGATIVE
Ketones, UA: NEGATIVE
Leukocytes, UA: NEGATIVE
Nitrite, UA: NEGATIVE
Protein, UA: NEGATIVE
Spec Grav, UA: 1.015 (ref 1.010–1.025)
Urobilinogen, UA: 0.2 E.U./dL
pH, UA: 6 (ref 5.0–8.0)

## 2021-07-17 MED ORDER — SOLIFENACIN SUCCINATE 10 MG PO TABS: 5.0000 mg | ORAL_TABLET | Freq: Every day | ORAL | 3 refills | Status: DC

## 2021-07-17 NOTE — Progress Notes (Signed)
   Subjective:    Patient ID: Ebony Ashley, female    DOB: [birth year], 76 y.o.   MRN: [id number]  HPI 76 year old Female     Review of Systems     Objective:   Physical Exam        Assessment & Plan:

## 2021-07-17 NOTE — Progress Notes (Signed)
   Subjective:    Patient ID: Ebony Ashley, female    DOB: September 15, 1945, 76 y.o.   MRN: [id number]  HPI 76 year old Female rushed in to the office because it was raining. Was SOB upon arrival .Had decreased pulse ox of 86% but was stable. This improved with rest to mid 90's.  No chest pain.  Her appointment today was for follow-up on E. coli UTI for which she was seen here on June 29.  She had been symptomatic for about a month.  Culture grew E. coli.  She was treated with Macrobid.  Organism was sensitive to Macrobid and she is here for follow-up today.  Her urine dipstick is completely normal.  She has a history of atrial fibrillation and is followed by Dr. Antoine Poche.  She is on Eliquis.  Her pulse oximetry is stable on room air and walking down the hall.  Level was 95%.  She is in no acute distress.    She also has a history of overactive bladder and brought that issue up again today.  Previously seen by Dr. Jacquelyne Balint.  Have been tried on several medications.  Says Vesicare was the best 1 that she had tried so we sent in prescription for Vesicare 10 mg daily.  She may start with 5 mg for a few days and work up to 10 mg.  She has no UTI symptoms and    Review of Systems     Objective:   Physical Exam Vital signs reviewed.  Her pulse is irregular.  Chest is clear.  Cardiac exam irregular irregular rhythm.  No pitting edema of the lower extremities.  Urine dipstick is now normal.       Assessment & Plan:  E. coli UTI-recently treated with Macrobid and dipstick UA today is clear.  She is asymptomatic.  Oxygen desaturation with rushing-she has a pulse oximeter at home and should check her pulse ox from time to time.  Atrial fibrillation followed by Cardiology and is on chronic anticoagulation  Oxygen desaturation with physical exertion.  Recommend that she monitor her pulse ox at home.  History of overactive bladder-has been seen at [facility] by Dr. McDiarmid a number of  years ago and was treated with a couple of different agents but she says Vesicare was the best so we have prescribed this for her with this visit as well.  History of urge incontinence.  Morbid obesity-unable to exercise due to osteoarthritis issues

## 2021-07-17 NOTE — Patient Instructions (Addendum)
Try Vesicare once again- 10 mg daily for overactive bladder. UTI resolved.  Monitor pulse oximetry at home from time to time and call if persistently low.

## 2021-07-22 NOTE — Patient Instructions (Addendum)
Continue current medications and follow-up in 6 months.  She does not want to be on insulin.  Blood pressure is stable.  She lives alone and is high risk for falling.  This has been discussed with her before.  Urinary tract infection is improving.  She will remain on chronic anticoagulation with history of atrial fibrillation.  Follow-up July 14.

## 2021-09-04 ENCOUNTER — Other Ambulatory Visit: Payer: Self-pay | Admitting: Internal Medicine

## 2021-09-08 ENCOUNTER — Telehealth: Payer: Self-pay | Admitting: Internal Medicine

## 2021-09-08 NOTE — Telephone Encounter (Signed)
Received a fax from [facility]  Medication Therapy Management: Physician Comprehensive Medication review Memo about patients medication. The medication they were asking about was about patient taking two medications at same time.  oxybutynin (DITROPAN-XL) 5 MG 24 hr tablet  D/C 02/15/2017  solifenacin (VESICARE) 10 MG tablet was first prescribed 03/30/2019 / currently on

## 2021-09-08 NOTE — Telephone Encounter (Signed)
This message was sent via FAXCOM, a product from [employer]. [email or web address].biscom.com/                    -------Fax Transmission Report-------  To:               Recipient at [phone] Subject:          FW: Hp Scans Result:           The transmission was successful. Explanation:      All Pages Ok Pages Sent:       6 Connect Time:     2 minutes, 18 seconds Transmit Time:    09/08/2021 16:07 Transfer Rate:    14400 Status Code:      0000 Retry Count:      0 Job Id:           [id number] Unique Id:        [id number] Fax Line:         35 Fax Server:       [id number]

## 2021-10-22 ENCOUNTER — Other Ambulatory Visit: Payer: Self-pay | Admitting: Internal Medicine

## 2021-10-22 DIAGNOSIS — I10 Essential (primary) hypertension: Secondary | ICD-10-CM

## 2021-10-28 ENCOUNTER — Ambulatory Visit
Admission: RE | Admit: 2021-10-28 | Discharge: 2021-10-28 | Disposition: A | Discharge status: Home / Self Care | Payer: HMO | Source: Ambulatory Visit | Attending: Internal Medicine | Admitting: Internal Medicine

## 2021-10-28 DIAGNOSIS — Z1231 Encounter for screening mammogram for malignant neoplasm of breast: Secondary | ICD-10-CM

## 2021-10-30 ENCOUNTER — Telehealth: Payer: Self-pay | Admitting: Internal Medicine

## 2021-10-30 ENCOUNTER — Other Ambulatory Visit: Payer: Self-pay | Admitting: Internal Medicine

## 2021-10-30 DIAGNOSIS — R928 Other abnormal and inconclusive findings on diagnostic imaging of breast: Secondary | ICD-10-CM

## 2021-10-30 NOTE — Telephone Encounter (Signed)
Patient called and wanted to know if she can come in and get her tetanus shot and covid booster. I did tell her I'm not sure if we do the covid booster here. Is it ok to schedule?

## 2021-11-03 NOTE — Telephone Encounter (Signed)
Called and let patient know these shots are covered by her insurance at the pharmacy, she verbalized understanding.

## 2021-11-19 ENCOUNTER — Ambulatory Visit
Admission: RE | Admit: 2021-11-19 | Discharge: 2021-11-19 | Disposition: A | Discharge status: Home / Self Care | Payer: HMO | Source: Ambulatory Visit | Attending: Internal Medicine | Admitting: Internal Medicine

## 2021-11-19 ENCOUNTER — Ambulatory Visit: Payer: HMO

## 2021-11-19 DIAGNOSIS — R928 Other abnormal and inconclusive findings on diagnostic imaging of breast: Secondary | ICD-10-CM

## 2021-12-14 ENCOUNTER — Other Ambulatory Visit: Payer: Self-pay | Admitting: Internal Medicine

## 2022-01-05 LAB — HM DIABETES EYE EXAM

## 2022-01-14 ENCOUNTER — Other Ambulatory Visit: Payer: PPO

## 2022-01-14 DIAGNOSIS — E119 Type 2 diabetes mellitus without complications: Secondary | ICD-10-CM

## 2022-01-14 DIAGNOSIS — E1169 Type 2 diabetes mellitus with other specified complication: Secondary | ICD-10-CM

## 2022-01-14 LAB — MICROALBUMIN / CREATININE URINE RATIO: Microalb Creat Ratio: 313

## 2022-01-14 LAB — MICROALBUMIN, URINE: Microalb, Ur: 20

## 2022-01-14 LAB — PROTEIN / CREATININE RATIO, URINE: Creatinine, Urine: 64

## 2022-01-15 ENCOUNTER — Ambulatory Visit: Payer: PPO | Admitting: Internal Medicine

## 2022-01-15 LAB — HEPATIC FUNCTION PANEL
AG Ratio: 1.4 (calc) (ref 1.0–2.5)
ALT: 10 U/L (ref 6–29)
AST: 8 U/L — ABNORMAL LOW (ref 10–35)
Albumin: 3.8 g/dL (ref 3.6–5.1)
Alkaline phosphatase (APISO): 163 U/L — ABNORMAL HIGH (ref 37–153)
Bilirubin, Direct: 0.1 mg/dL (ref 0.0–0.2)
Globulin: 2.8 g/dL (calc) (ref 1.9–3.7)
Indirect Bilirubin: 0.4 mg/dL (calc) (ref 0.2–1.2)
Total Bilirubin: 0.5 mg/dL (ref 0.2–1.2)
Total Protein: 6.6 g/dL (ref 6.1–8.1)

## 2022-01-15 LAB — LIPID PANEL
Cholesterol: 151 mg/dL (ref <200)
HDL: 43 mg/dL — ABNORMAL LOW (ref >=50)
LDL Cholesterol (Calc): 90 mg/dL (calc)
Non-HDL Cholesterol (Calc): 108 mg/dL (calc) (ref <130)
Total CHOL/HDL Ratio: 3.5 (calc) (ref <5.0)
Triglycerides: 86 mg/dL (ref <150)

## 2022-01-15 LAB — HEMOGLOBIN A1C
Hgb A1c MFr Bld: 10.6 % of total Hgb — ABNORMAL HIGH (ref <5.7)
Mean Plasma Glucose: 258 mg/dL
eAG (mmol/L): 14.3 mmol/L

## 2022-01-15 LAB — MICROALBUMIN / CREATININE URINE RATIO
Creatinine, Urine: 64 mg/dL (ref 20–275)
Microalb Creat Ratio: 313 mcg/mg creat — ABNORMAL HIGH (ref <30)
Microalb, Ur: 20 mg/dL

## 2022-01-18 ENCOUNTER — Ambulatory Visit (INDEPENDENT_AMBULATORY_CARE_PROVIDER_SITE_OTHER): Payer: PPO | Admitting: Internal Medicine

## 2022-01-18 ENCOUNTER — Encounter: Payer: Self-pay | Admitting: Internal Medicine

## 2022-01-18 VITALS — BP 108/64 | HR 64 | Temp 98.5°F | Ht 61.25 in | Wt 272.0 lb

## 2022-01-18 DIAGNOSIS — E1165 Type 2 diabetes mellitus with hyperglycemia: Secondary | ICD-10-CM | POA: Diagnosis not present

## 2022-01-18 DIAGNOSIS — I4891 Unspecified atrial fibrillation: Secondary | ICD-10-CM

## 2022-01-18 DIAGNOSIS — R5381 Other malaise: Secondary | ICD-10-CM | POA: Diagnosis not present

## 2022-01-18 DIAGNOSIS — N3281 Overactive bladder: Secondary | ICD-10-CM

## 2022-01-18 DIAGNOSIS — K219 Gastro-esophageal reflux disease without esophagitis: Secondary | ICD-10-CM

## 2022-01-18 DIAGNOSIS — Z23 Encounter for immunization: Secondary | ICD-10-CM

## 2022-01-18 DIAGNOSIS — Z602 Problems related to living alone: Secondary | ICD-10-CM

## 2022-01-18 DIAGNOSIS — Z9181 History of falling: Secondary | ICD-10-CM

## 2022-01-18 DIAGNOSIS — Z8659 Personal history of other mental and behavioral disorders: Secondary | ICD-10-CM

## 2022-01-18 NOTE — Progress Notes (Signed)
Subjective:    Patient ID: Ebony Ashley, female    DOB: [birth year], 77 y.o.   MRN: [id number]  HPI 77 year old Female seen for  6 month follow up appt. Missed appt after recent lab draw. Spoke with patient's daughter by phone who lives out of state and is a Marine scientist. She was unaware of patient's missed appt. Reminded pt she always has an appt here after her blood draw. Says she will be moving to the [facility] area this Fall where her son is currently living in the house patient owns.  We discussed finding another primary care physician when that occurs.  We discussed why she missed her recent appointment.  Reminded her that she always has follow-up with after her lab appointment to see me.  Admits to not following a strict diabetic diet.  Has been going to fast food restaurants like [employer] and [employer].  I think she has some generalized fatigue and does not like to spend a lot of time preparing her meals.  She resides alone.  She does have a Medic alert device that she wears so she may call for help if needed.  Her lipid panel is normal with the exception of a low HDL of 43.  History of low HDL.  Liver functions are normal.  Has microalbuminuria.  Hemoglobin A1c increased from 7.8% in June to 10.6%.  She is currently on Januvia 100 mg daily, metformin and Glucotrol XL.  History of chronic anticoagulation treated with Eliquis.  History of hypertension treated with metoprolol and ramipril.  In 2021 she was found to have new onset atrial fibrillation and was referred to cardiology.  She was placed on Eliquis.  2D echocardiogram showed normal left ventricular function, mild left atrial enlargement and moderate pulmonary hypertension.  Has tried numerous medications for urge urinary incontinence.  Failed Ditropan and Myrbetriq and tried on Vesicare.  History of apical systolic murmur noted by Dr. Percival Spanish in 2013.  Review of Systems see above     Objective:   Physical Exam Blood pressure is  excellent at 108/64, pulse 64 regular temperature 98.5 degrees.  Pulse oximetry 98% on room air.  Weight 272 pounds BMI 50.9 Neck is supple without JVD, thyromegaly or carotid bruits.  Chest clear.      Assessment & Plan:   Poorly controlled DM- reviewed diet today.  Diet needs some work.  Patient needs to be motivated to watch her diet.  Hemoglobin A1c is 10.6%.  It was 7.8% in June 2023.  Eating bananna sandwiches, [employer], fast food hamburgers. Does not seem like she cooks much. Says she will be moving to [place] to home she owns in the Fall. Her son is currently living in that home. Would like [facility] chronic care management to assist with her diet counseling. I will see her agin in a few weeks for follow up with OV and Hgb AIC. Have not changed meds. She is on Januvia assistance program, I think. I spoke with her daughter who is a Marine scientist and resides out of state last week about patient's missed appt. she has morbid obesity and is at risk falling.  She has a Runner, broadcasting/film/video alert device that she wears apparently mainly at night.  Daughter is aware of my concerns.  Patient will follow-up in mid March with hemoglobin A1c and office visit.  She will try to do better with diet.  History of atrial fibrillation maintained on chronic anticoagulation  Overactive bladder seen by  urology  History of mild depression since the death of her husband  Morbid obesity-has not been watching her diet  History of systolic murmur  Anxiety treated with Xanax at bedtime  GE reflux treated with generic Nexium  Family history of colon cancer-Cologuard was negative in 2021  History of mixed hyperlipidemia treated with low-dose Crestor.  Lipid panel is within normal limits except for low HDL of 43.  Her alkaline phosphatase is elevated at 163 which is new but SGOT and SGPT are normal.

## 2022-01-18 NOTE — Patient Instructions (Addendum)
Needs pneumococcal 20 vaccine. Wants to get it at [facility]. Needs to work on diet. Hgb A1C is quite elevated at 10.6%.  Follow-up in March.  Patient needs to continue taking her medications and begin to watch her diet more carefully.  [facility] to intervene with chronic care management.

## 2022-01-19 ENCOUNTER — Telehealth: Payer: Self-pay | Admitting: *Deleted

## 2022-01-19 NOTE — Progress Notes (Signed)
  Care Coordination   Note   01/19/2022 Name: Ebony Ashley MRN: [id number] DOB: 12-28-1945  Ebony Ashley is a 77 y.o. year old female who sees Baxley, Cresenciano Lick, MD for primary care. I reached out to Ebony Ashley by phone today to offer care coordination services.  [name] was given information about Care Coordination services today including:   The Care Coordination services include support from the care team which includes your Nurse Coordinator, Clinical Social Worker, or Pharmacist.  The Care Coordination team is here to help remove barriers to the health concerns and goals most important to you. Care Coordination services are voluntary, and the patient may decline or stop services at any time by request to their care team member.   Care Coordination Consent Status: Patient agreed to services and verbal consent obtained.   Follow up plan:  Telephone appointment with care coordination team member scheduled for:  01/20/22  Encounter Outcome:  Pt. Scheduled  [place]  Direct Dial: [phone]

## 2022-01-20 ENCOUNTER — Ambulatory Visit: Payer: Self-pay

## 2022-01-20 NOTE — Patient Outreach (Signed)
  Care Coordination   Initial Visit Note   01/20/2022 Name: Ebony Ashley MRN: [id number] DOB: [birth year]  Ebony Ashley is a 77 y.o. year old female who sees Baxley, Cresenciano Lick, MD for primary care. I spoke with  [name] by phone today.  What matters to the patients health and wellness today?  Patient would like to work on improving her diet to help lower her A1c.     Goals Addressed               This Visit's Progress     Patient Stated     To pay more attention to dietary intake (pt-stated)        Care Coordination Interventions: Provided education to patient about basic DM disease process Reviewed medications with patient and discussed importance of medication adherence Review of patient status, including review of consultants reports, relevant laboratory and other test results, and medications completed Counseled on Diabetic diet, my plate method, 621 minutes of moderate intensity exercise/week Educated patient about the PREP program, patient will consider Mailed printed educational materials related to Diabetes Management            SDOH assessments and interventions completed:  No     Care Coordination Interventions:  Yes, provided   Follow up plan: Follow up call scheduled for 02/17/22 @12  PM    Encounter Outcome:  Pt. Visit Completed

## 2022-01-20 NOTE — Patient Instructions (Signed)
Visit Information  Thank you for taking time to visit with me today. Please don't hesitate to contact me if I can be of assistance to you.   Following are the goals we discussed today:   Goals Addressed               This Visit's Progress     Patient Stated     To pay more attention to dietary intake (pt-stated)        Care Coordination Interventions: Provided education to patient about basic DM disease process Reviewed medications with patient and discussed importance of medication adherence Review of patient status, including review of consultants reports, relevant laboratory and other test results, and medications completed Counseled on Diabetic diet, my plate method, 480 minutes of moderate intensity exercise/week Educated patient about the PREP program, patient will consider Mailed printed educational materials related to Diabetes Management            Our next appointment is by telephone on 02/17/22 at 12 PM  Please call the care guide team at [phone] if you need to cancel or reschedule your appointment.   If you are experiencing a Mental Health or [place] or need someone to talk to, please go to [facility] [place] [phone])  The patient verbalized understanding of instructions, educational materials, and care plan provided today and agreed to receive a mailed copy of patient instructions, educational materials, and care plan.   Barb Merino, RN, BSN, CCM Care Management Coordinator [facility] Care Management  Direct Phone: [phone]

## 2022-02-19 ENCOUNTER — Ambulatory Visit: Payer: Self-pay

## 2022-02-19 NOTE — Patient Outreach (Signed)
  Care Coordination   02/19/2022 Name: Ebony Ashley MRN: [id number] DOB: [birth year]   Care Coordination Outreach Attempts:  An unsuccessful telephone outreach was attempted for a scheduled appointment today.  Follow Up Plan:  Additional outreach attempts will be made to offer the patient care coordination information and services.   Encounter Outcome:  No Answer   Care Coordination Interventions:  No, not indicated    Barb Merino, RN, BSN, CCM Care Management Coordinator [facility] Care Management Direct Phone: [phone]

## 2022-02-20 ENCOUNTER — Other Ambulatory Visit: Payer: Self-pay | Admitting: Cardiology

## 2022-02-20 ENCOUNTER — Other Ambulatory Visit: Payer: Self-pay | Admitting: Internal Medicine

## 2022-02-20 DIAGNOSIS — I4891 Unspecified atrial fibrillation: Secondary | ICD-10-CM

## 2022-02-20 DIAGNOSIS — I10 Essential (primary) hypertension: Secondary | ICD-10-CM

## 2022-02-22 NOTE — Telephone Encounter (Signed)
Prescription refill request for Eliquis received. Indication: AF Last office visit: 03/27/20 Scr: 0.74   Epic Age: 77 Weight: 123.8kg  Based on above findings Eliquis 55m twice daily is the appropriate dose.  Pt is past due for appt with Dr HPercival Spanish  Message sent to schedulers and refill approved x 1.

## 2022-03-09 ENCOUNTER — Telehealth: Payer: Self-pay | Admitting: *Deleted

## 2022-03-09 NOTE — Progress Notes (Signed)
  Care Coordination Note  03/09/2022 Name: Ebony Ashley MRN: [id number] DOB: [birth year]  Ebony Ashley is a 77 y.o. year old female who is a primary care patient of Baxley, Cresenciano Lick, MD and is actively engaged with the care management team. I reached out to [name] by phone today to assist with re-scheduling a follow up visit with the RN Case Manager  Follow up plan: Unsuccessful telephone outreach attempt made. A HIPAA compliant phone message was left for the patient providing contact information and requesting a return call.   [place]  Direct Dial: [phone]

## 2022-03-11 NOTE — Progress Notes (Shared)
    Patient Care Team: Elby Showers, MD as PCP - General (Internal Medicine) Minus Breeding, MD as PCP - Cardiology (Cardiology) Lynne Logan, RN as [place] Management  Visit Date: 03/11/22  Subjective:    Patient ID: Ebony Ashley , Female   DOB: [birth year], 77 y.o.    MRN: [id number]   77 y.o. Female presents today for A1c check. Patient has a past medical history of diabetes mellitus, atrial fibrillation, esophageal spasm, esophagitis, hiatal hernia, hypertension.  History of Type 2 diabetes mellitus treated with Glucophage 850 mg daily, Glucotrol XL 10 mg daily with breakfast. HGBA1c at 10.6 on 01/14/22.    Past Medical History:  Diagnosis Date   Atrial fibrillation ([place])    Diabetes mellitus    Esophageal spasm    Esophagitis    Hiatal hernia    Hypertension      Family History  Problem Relation Age of Onset   Arthritis Mother    Atrial fibrillation Mother 62   Fibromyalgia Mother    Osteoporosis Mother    Colon cancer Mother    Hypertension Brother    Diabetes Brother    Asthma Brother     Social History   Social History Narrative   Not on file      ROS      Objective:   Vitals: LMP 01/05/1988 (Approximate)    Physical Exam    Results:   Studies obtained and personally reviewed by me:  Imaging, colonoscopy, mammogram, bone density scan, echocardiogram, heart cath, stress test, CT calcium score, etc. ***   Labs:       Component Value Date/Time   NA 141 07/02/2021 0924   K 4.9 07/02/2021 0924   CL 102 07/02/2021 0924   CO2 28 07/02/2021 0924   GLUCOSE 206 (H) 07/02/2021 0924   BUN 10 07/02/2021 0924   CREATININE 0.74 07/02/2021 0924   CALCIUM 9.5 07/02/2021 0924   PROT 6.6 01/14/2022 0940   ALBUMIN 4.2 04/08/2015 0904   AST 8 (L) 01/14/2022 0940   ALT 10 01/14/2022 0940   ALKPHOS 130 04/08/2015 0904   BILITOT 0.5 01/14/2022 0940   GFRNONAA 73 05/27/2020 0934   GFRAA 85 05/27/2020 0934     Lab  Results  Component Value Date   WBC 7.6 07/02/2021   HGB 16.2 (H) 07/02/2021   HCT 49.0 (H) 07/02/2021   MCV 91.4 07/02/2021   PLT 279 07/02/2021    Lab Results  Component Value Date   CHOL 151 01/14/2022   HDL 43 (L) 01/14/2022   LDLCALC 90 01/14/2022   TRIG 86 01/14/2022   CHOLHDL 3.5 01/14/2022    Lab Results  Component Value Date   HGBA1C 10.6 (H) 01/14/2022     Lab Results  Component Value Date   TSH 1.74 07/02/2021     No results found for: "PSA1", "PSA" *** delete for female pts  ***    Assessment & Plan:   ***    I,Alexander Ruley,acting as a scribe for Elby Showers, MD.,have documented all relevant documentation on the behalf of Elby Showers, MD,as directed by  Elby Showers, MD while in the presence of Elby Showers, MD.   ***

## 2022-03-15 NOTE — Progress Notes (Signed)
  Care Coordination Note  03/15/2022 Name: Ebony Ashley MRN: [id number] DOB: [birth year]  Ebony Ashley is a 77 y.o. year old female who is a primary care patient of Baxley, Cresenciano Lick, MD and is actively engaged with the care management team. I reached out to [name] by phone today to assist with re-scheduling a follow up visit with the RN Case Manager  Follow up plan: Telephone appointment with care management team member scheduled for:04/02/22  [place]: [phone]

## 2022-03-16 ENCOUNTER — Other Ambulatory Visit: Payer: PPO

## 2022-03-16 DIAGNOSIS — E1165 Type 2 diabetes mellitus with hyperglycemia: Secondary | ICD-10-CM

## 2022-03-17 LAB — HEMOGLOBIN A1C
Hgb A1c MFr Bld: 10.9 % of total Hgb — ABNORMAL HIGH (ref <5.7)
Mean Plasma Glucose: 266 mg/dL
eAG (mmol/L): 14.7 mmol/L

## 2022-03-18 ENCOUNTER — Ambulatory Visit (INDEPENDENT_AMBULATORY_CARE_PROVIDER_SITE_OTHER): Payer: PPO | Admitting: Internal Medicine

## 2022-03-18 ENCOUNTER — Encounter: Payer: Self-pay | Admitting: Internal Medicine

## 2022-03-18 ENCOUNTER — Ambulatory Visit: Payer: PPO | Admitting: Internal Medicine

## 2022-03-18 VITALS — BP 110/76 | HR 77 | Temp 98.4°F | Ht 61.25 in | Wt 268.8 lb

## 2022-03-18 DIAGNOSIS — I10 Essential (primary) hypertension: Secondary | ICD-10-CM | POA: Diagnosis not present

## 2022-03-18 DIAGNOSIS — Z7901 Long term (current) use of anticoagulants: Secondary | ICD-10-CM

## 2022-03-18 DIAGNOSIS — E1165 Type 2 diabetes mellitus with hyperglycemia: Secondary | ICD-10-CM

## 2022-03-18 DIAGNOSIS — E1169 Type 2 diabetes mellitus with other specified complication: Secondary | ICD-10-CM | POA: Diagnosis not present

## 2022-03-18 DIAGNOSIS — E785 Hyperlipidemia, unspecified: Secondary | ICD-10-CM

## 2022-03-18 NOTE — Progress Notes (Signed)
Patient Care Team: Elby Showers, MD as PCP - General (Internal Medicine) Minus Breeding, MD as PCP - Cardiology (Cardiology) Lynne Logan, RN as [place] Management  Visit Date: 03/18/22  Subjective:    Patient ID: Ebony Ashley , Female   DOB: [birth year], 77 y.o.    MRN: [id number]   77 y.o. Female presents today for A1c check. Patient has a past medical history of atrial fibrillation, Type 2 diabetes mellitus, esophageal spasm, esophagitis, hiatal hernia, hypertension.  History of Type 2 diabetes mellitus treated with Glucophage 850 mg daily, Glucotrol XL 10 mg daily with breakfast, Januvia 100 mg daily. Compliant with these medications. HGBA1c at 10.9% on 03/16/22. Has not been checking her blood glucose. She would like to try lowering her A1c with healthy diet before seeing endocrinologist. She has been eating donuts recently but says she can remove this from her diet. She is eating one egg biscuit in the morning, caesar salad with dressing in afternoons.  History of hypertension treated with Norvasc 10 mg daily, Toprol-XL 100 mg daily with or immediately following a meal. Compliant with these medications. Blood pressure normal today at 110/76. Denies chest pain, shortness of breath.  History of atrial fibrillation. Not interested in going back to her cardiologist.   Past Medical History:  Diagnosis Date   Atrial fibrillation ([place])    Diabetes mellitus    Esophageal spasm    Esophagitis    Hiatal hernia    Hypertension      Family History  Problem Relation Age of Onset   Arthritis Mother    Atrial fibrillation Mother 17   Fibromyalgia Mother    Osteoporosis Mother    Colon cancer Mother    Hypertension Brother    Diabetes Brother    Asthma Brother     Social History   Social History Narrative   Not on file      Review of Systems  Constitutional:  Negative for fever and malaise/fatigue.  HENT:  Negative for congestion.   Eyes:   Negative for blurred vision.  Respiratory:  Negative for cough and shortness of breath.   Cardiovascular:  Negative for chest pain, palpitations and leg swelling.  Gastrointestinal:  Negative for vomiting.  Musculoskeletal:  Negative for back pain.  Skin:  Negative for rash.  Neurological:  Negative for loss of consciousness and headaches.        Objective:   Vitals: BP 110/76   Pulse 77   Temp 98.4 F (36.9 C) (Tympanic)   Ht 5' 1.25" (1.556 m)   Wt 268 lb 12.8 oz (121.9 kg)   LMP 01/05/1988 (Approximate)   SpO2 98%   BMI 50.38 kg/m    Physical Exam Vitals and nursing note reviewed.  Constitutional:      General: She is not in acute distress.    Appearance: Normal appearance. She is not toxic-appearing.  HENT:     Head: Normocephalic and atraumatic.  Neck:     Thyroid: No thyroid mass, thyromegaly or thyroid tenderness.     Vascular: No carotid bruit.  Cardiovascular:     Rate and Rhythm: Normal rate and regular rhythm. No extrasystoles are present.    Pulses:          Dorsalis pedis pulses are 1+ on the right side and 1+ on the left side.       Posterior tibial pulses are 0 on the right side and 0 on the left side.  Heart sounds: Normal heart sounds. No murmur heard.    No gallop.     Comments: She is in atrial fibrillation. Pulmonary:     Effort: Pulmonary effort is normal. No respiratory distress.     Breath sounds: Normal breath sounds. No wheezing or rales.  Lymphadenopathy:     Cervical: No cervical adenopathy.  Skin:    General: Skin is warm and dry.  Neurological:     Mental Status: She is alert and oriented to person, place, and time. Mental status is at baseline.  Psychiatric:        Mood and Affect: Mood normal.        Behavior: Behavior normal.        Thought Content: Thought content normal.        Judgment: Judgment normal.       Results:   Studies obtained and personally reviewed by me:   Labs:       Component Value Date/Time    NA 141 07/02/2021 0924   K 4.9 07/02/2021 0924   CL 102 07/02/2021 0924   CO2 28 07/02/2021 0924   GLUCOSE 206 (H) 07/02/2021 0924   BUN 10 07/02/2021 0924   CREATININE 0.74 07/02/2021 0924   CALCIUM 9.5 07/02/2021 0924   PROT 6.6 01/14/2022 0940   ALBUMIN 4.2 04/08/2015 0904   AST 8 (L) 01/14/2022 0940   ALT 10 01/14/2022 0940   ALKPHOS 130 04/08/2015 0904   BILITOT 0.5 01/14/2022 0940   GFRNONAA 73 05/27/2020 0934   GFRAA 85 05/27/2020 0934     Lab Results  Component Value Date   WBC 7.6 07/02/2021   HGB 16.2 (H) 07/02/2021   HCT 49.0 (H) 07/02/2021   MCV 91.4 07/02/2021   PLT 279 07/02/2021    Lab Results  Component Value Date   CHOL 151 01/14/2022   HDL 43 (L) 01/14/2022   LDLCALC 90 01/14/2022   TRIG 86 01/14/2022   CHOLHDL 3.5 01/14/2022    Lab Results  Component Value Date   HGBA1C 10.9 (H) 03/16/2022     Lab Results  Component Value Date   TSH 1.74 07/02/2021      Assessment & Plan:   Type 2 diabetes mellitus: treated with Glucophage 850 mg daily, Glucotrol XL 10 mg daily with breakfast, Januvia 100 mg daily. Compliant with these medications. HGBA1c at 10.9% on 03/16/22. Advised to maintain healthy diet and exercise and take blood glucose readings regularly. Would like to recheck A1c in July before going to the endocrinologist.  Hypertension: treated with Norvasc 10 mg daily, Toprol-XL 100 mg daily with or immediately following a meal. Compliant with these medications. Blood pressure normal today at 110/76. Denies chest pain, shortness of breath.  Atrial fibrillation: not interested in going back to her cardiologist. Pulse stable at 77 today.Continue Eliquis  Return in July 2024 for follow-up.    I,Alexander Ruley,acting as a Education administrator for Elby Showers, MD.,have documented all relevant documentation on the behalf of Elby Showers, MD,as directed by  Elby Showers, MD while in the presence of Elby Showers, MD.   I, Elby Showers, MD, have reviewed all  documentation for this visit. The documentation on 04/01/22 for the exam, diagnosis, procedures, and orders are all accurate and complete.

## 2022-04-01 NOTE — Patient Instructions (Addendum)
Watch high calorie foods that are contributing to hyperglycemia. Discussed at length today. BP stable on current regimen. Does not want to see Cardiologist for A-fib follow up.  Return in July 2024.

## 2022-04-06 ENCOUNTER — Ambulatory Visit: Payer: Self-pay

## 2022-04-06 NOTE — Patient Instructions (Signed)
Visit Information  Thank you for taking time to visit with me today. Please don't hesitate to contact me if I can be of assistance to you.   Following are the goals we discussed today:   Goals Addressed               This Visit's Progress     Patient Stated     To pay more attention to dietary intake (pt-stated)        Care Coordination Interventions: Provided education to patient about basic DM disease process and potential complications related to uncontrolled DM Reviewed medications with patient and discussed importance of medication adherence Determined patient continues not to self check blood sugars at home Determined she recently completed PCP follow up and discussed plans to work on diet and exercise before establishing with an Endocrinologist Re-educated patient on Diabetic diet, my plate method, [id number] minutes of moderate intensity exercise/week Re-educated patient about the PREP program, patient will review the brochure and consider Determined patient did not receive the previously mailed educational materials related to diabetes management and PREP, these ed mats were resent to patient's home address for review and discussion at next scheduled RN CC follow up       Other     COMPLETED: HEMOGLOBIN A1C < 7         Your last documented AIC is 7.2 on 03/20/19. Have your [facility] checked every 6 months if you are at goal or every 3 months if you are not at goal. Check blood sugars daily before eating with goal of 80-130.  You can also check 1 1/2 hours after eating with goal of 180 or less. Plan to eat low carbohydrate and low salt meals, watch portion sizes and avoid sugar sweetened drinks.  Discussed carbohydrate control meals. Reviewed signs and symptoms of hyperglycemia (high blood sugar) and hypoglycemia (low blood sugar) and actions to take. Review [employer] calendar (sent in the mail) for diabetes action plan in the back. Reviewed nutrition counseling benefit  provided by [employer].  Will refer to [place] to assist with dietary management of diabetes.  Increase activity only if you are able to do it.  Follow doctor recommendations. EMMI education provided on "Diabetes and Diet.  Review and plan to discuss with RN during next telephonic assessment.          Our next appointment is by telephone on 05/11/22 at 1:00 PM  Please call the care guide team at [phone] if you need to cancel or reschedule your appointment.   If you are experiencing a Mental Health or [place] or need someone to talk to, please call 1-800-273-TALK (toll free, 24 hour hotline) go to [facility] [place] [phone])  The patient verbalized understanding of instructions, educational materials, and care plan provided today and DECLINED offer to receive copy of patient instructions, educational materials, and care plan.   Barb Merino, RN, BSN, CCM Care Management Coordinator [facility] Care Management Direct Phone: [phone]

## 2022-04-06 NOTE — Patient Outreach (Signed)
Care Coordination   Follow Up Visit Note   04/06/2022 Name: Ebony Ashley MRN: [id number] DOB: 31-May-1945  Ebony Ashley is a 77 y.o. year old female who sees Baxley, Cresenciano Lick, MD for primary care. I spoke with  [name] by phone today.  What matters to the patients health and wellness today?  Patient would like to change her eating habits and implement a routine exercise regimen to help lower her A1c.     Goals Addressed               This Visit's Progress     Patient Stated     To pay more attention to dietary intake (pt-stated)        Care Coordination Interventions: Provided education to patient about basic DM disease process and potential complications related to uncontrolled DM Reviewed medications with patient and discussed importance of medication adherence Determined patient continues not to self check blood sugars at home Determined she recently completed PCP follow up and discussed plans to work on diet and exercise before establishing with an Endocrinologist Re-educated patient on Diabetic diet, my plate method, [id number] minutes of moderate intensity exercise/week Re-educated patient about the PREP program, patient will review the brochure and consider Determined patient did not receive the previously mailed educational materials related to diabetes management and PREP, these ed mats were resent to patient's home address for review and discussion at next scheduled RN CC follow up         Other     COMPLETED: HEMOGLOBIN A1C < 7         Your last documented AIC is 7.2 on 03/20/19. Have your [facility] checked every 6 months if you are at goal or every 3 months if you are not at goal. Check blood sugars daily before eating with goal of 80-130.  You can also check 1 1/2 hours after eating with goal of 180 or less. Plan to eat low carbohydrate and low salt meals, watch portion sizes and avoid sugar sweetened drinks.  Discussed carbohydrate control meals. Reviewed  signs and symptoms of hyperglycemia (high blood sugar) and hypoglycemia (low blood sugar) and actions to take. Review [employer] calendar (sent in the mail) for diabetes action plan in the back. Reviewed nutrition counseling benefit provided by [employer].  Will refer to [place] to assist with dietary management of diabetes.  Increase activity only if you are able to do it.  Follow doctor recommendations. EMMI education provided on "Diabetes and Diet.  Review and plan to discuss with RN during next telephonic assessment.       Interventions Today    Flowsheet Row Most Recent Value  Chronic Disease   Chronic disease during today's visit Diabetes  General Interventions   General Interventions Discussed/Reviewed General Interventions Discussed, General Interventions Reviewed, Doctor Visits  Doctor Visits Discussed/Reviewed PCP, Doctor Visits Reviewed, Doctor Visits Discussed  Exercise Interventions   Exercise Discussed/Reviewed Exercise Reviewed, Exercise Discussed, Physical Activity  Physical Activity Discussed/Reviewed PREP, Physical Activity Reviewed, Physical Activity Discussed  Education Interventions   Education Provided Provided Education, Provided Printed Education  Provided Verbal Education On Nutrition, Labs, Exercise  Labs Reviewed Hgb A1c  Nutrition Interventions   Nutrition Discussed/Reviewed Nutrition Discussed, Nutrition Reviewed, Carbohydrate meal planning, Portion sizes          SDOH assessments and interventions completed:  No     Care Coordination Interventions:  Yes, provided   Follow up plan: Follow up call  scheduled for 05/11/22 @1 :00 PM    Encounter Outcome:  Pt. Visit Completed

## 2022-04-13 ENCOUNTER — Telehealth: Payer: Self-pay | Admitting: Cardiology

## 2022-04-13 NOTE — Telephone Encounter (Signed)
*  STAT* If patient is at the pharmacy, call can be transferred to refill team.   1. Which medications need to be refilled? (please list name of each medication and dose if known)    apixaban (ELIQUIS) 5 MG TABS tablet  2. Which pharmacy/location (including street and city if local pharmacy) is medication to be sent to?  [id number] - [place], [place] - [address]   3. Do they need a 30 day or 90 day supply?   90 day   Patient stated she misplaced her medication and cannot find it.  Patient states she will need a prescription sent to her pharmacy.

## 2022-04-15 ENCOUNTER — Telehealth: Payer: Self-pay | Admitting: Cardiology

## 2022-04-15 DIAGNOSIS — I4891 Unspecified atrial fibrillation: Secondary | ICD-10-CM

## 2022-04-15 MED ORDER — APIXABAN 5 MG PO TABS: 5.0000 mg | ORAL_TABLET | Freq: Two times a day (BID) | ORAL | 2 refills | Status: DC

## 2022-04-15 NOTE — Telephone Encounter (Signed)
Pt c/o medication issue:  1. Name of Medication:   apixaban (ELIQUIS) 5 MG TABS tablet    2. How are you currently taking this medication (dosage and times per day)? TAKE 1 TABLET BY MOUTH TWICE A DAY   3. Are you having a reaction (difficulty breathing--STAT)? No  4. What is your medication issue? Patient called stating [facility]. Patient stated that she misplaced her Eliquis and can not find where she put it. Patient stated she is currently out of this medication. Please advise

## 2022-04-15 NOTE — Telephone Encounter (Signed)
Pt is overdue to be seen - has not been seen by cardiologist since 2022 so her last rx did not have any refills on it. Looks like she is now scheduled for f/u next month, I have sent in Eliquis refill.

## 2022-04-15 NOTE — Telephone Encounter (Signed)
Called patient, patient believes she misplaced her Eliquis and she is out of medication.   She states she got 3 bottles of medication and assumed the Eliquis was in the bag.     Per chart review-last rx 2/19 for 30 day supply  [facility] on [facility]/22 for 30 day supply.  New rx needed.  Will send to pharmD to review rx.   Patient aware.

## 2022-04-16 NOTE — Telephone Encounter (Signed)
Already sent to pharmacy 4-11

## 2022-04-18 ENCOUNTER — Other Ambulatory Visit: Payer: Self-pay | Admitting: Internal Medicine

## 2022-05-11 ENCOUNTER — Ambulatory Visit: Payer: Self-pay

## 2022-05-11 NOTE — Patient Instructions (Signed)
Visit Information  Thank you for taking time to visit with me today. Please don't hesitate to contact me if I can be of assistance to you.   Following are the goals we discussed today:   Goals Addressed               This Visit's Progress     Patient Stated     To pay more attention to dietary intake (pt-stated)        Care Coordination Interventions: Provided education to patient about basic DM disease process including potential complications secondary to uncontrolled diabetes Reviewed medications with patient and discussed importance of medication adherence Review of patient status, including review of consultants reports, relevant laboratory and other test results, and medications completed Counseled on Diabetic diet, my plate method, 161 minutes of moderate intensity exercise/week Confirmed patient received the PREP brochure, she has not looked at it but will do when she is ready  Determined patient is not checking her CBG's at home  Educated patient regarding the benefits of using continuous glucose monitoring via Dexcom and or Libre sensor, educated patient regarding how to apply the sensor, patient stated she will consider and let PCP know at next scheduled visit if she would like to proceed Reviewed next scheduled PCP follow up with Dr. Lenord Fellers set for 07/16/22 @3 :00 PM for an annual wellness visit  Assessed patient's understanding of A1c goal: <7% Lab Results  Component Value Date   HGBA1C 10.9 (H) 03/16/2022         Our next appointment is by telephone on 07/26/22 at 09:00 AM  Please call the care guide team at [phone] if you need to cancel or reschedule your appointment.   If you are experiencing a Mental Health or Behavioral Health Crisis or need someone to talk to, please call 1-800-273-TALK (toll free, 24 hour hotline) go to [facility] [address] [place] [phone])  Patient verbalizes understanding of  instructions and care plan provided today and agrees to view in MyChart. Active MyChart status and patient understanding of how to access instructions and care plan via MyChart confirmed with patient.     Delsa Sale, RN, BSN, CCM Care Management Coordinator [facility] Care Management  Direct Phone: [phone]

## 2022-05-11 NOTE — Progress Notes (Unsigned)
  Cardiology Office Note:   Date:  05/13/2022  ID:  Ebony Ashley, DOB 1945-08-18, MRN [id number]  History of Present Illness:   Ebony Ashley is a 77 y.o. female who presents for follow up of atrial fib.   She has persistent atrial fibrillation that she does not notice.  The patient denies any new symptoms such as chest discomfort, neck or arm discomfort. There has been no new shortness of breath, PND or orthopnea. There have been no reported palpitations, presyncope or syncope.  She tolerates anticoagulation.  She does not get around very well because of balance issues and knee pain.  She does have a glider type exercise machine in her living room now but she has not been getting on it.    ROS: As stated in the HPI and negative for all other systems.  Studies Reviewed:    EKG: Atrial fibrillation rate right axis deviation, intervals WNL, no acute ST T wave changes   Risk Assessment/Calculations:    CHA2DS2-VASc Score = 5   This indicates a 7.2% annual risk of stroke. The patient's score is based upon: CHF History: 0 HTN History: 1 Diabetes History: 1 Stroke History: 0 Vascular Disease History: 0 Age Score: 2 Gender Score: 1       Physical Exam:   VS:  BP 132/84 (BP Location: Left Arm, Patient Position: Sitting, Cuff Size: Large)   Pulse (!) 103   Ht 5' 1.25" (1.556 m)   Wt 262 lb (118.8 kg)   LMP 01/05/1988 (Approximate)   SpO2 93%   BMI 49.10 kg/m    Wt Readings from Last 3 Encounters:  05/13/22 262 lb (118.8 kg)  03/18/22 268 lb 12.8 oz (121.9 kg)  01/18/22 272 lb (123.4 kg)     GEN: Well nourished, well developed in no acute distress NECK: No JVD; No carotid bruits CARDIAC: IrregularRR, no murmurs, rubs, gallops RESPIRATORY:  Clear to auscultation without rales, wheezing or rhonchi  ABDOMEN: Soft, non-tender, non-distended EXTREMITIES:  No edema; No deformity   ASSESSMENT AND PLAN:   ATRIAL FIB:   Ms. Ebony Ashley has a CHA2DS2 - VASc score of 5.    She tolerates anticoagulation.  We talked again this year about getting her pulse oximeter out checking her heart rate make sure she has good rate control.  She really did not want to use a monitor in the past.  I will check a CBC today.    OBESITY: We talked again about exercise.  She is low carbohydrate.  DM: I do see that her A1c was 10.9 earlier this year.  I am sending a note off to Baxley, Ebony Cole, MD to see if she wants to consider semaglutide  Signed, Rollene Rotunda, MD

## 2022-05-11 NOTE — Patient Outreach (Signed)
  Care Coordination   Follow Up Visit Note   05/11/2022 Name: Ebony Ashley MRN: [id number] DOB: 01-Oct-1945  Ebony Ashley is a 77 y.o. year old female who sees Baxley, Luanna Cole, MD for primary care. I spoke with  [name] by phone today.  What matters to the patients health and wellness today?  Patient will consider using continuous glucose monitoring. She will review the PREP brochure.     Goals Addressed               This Visit's Progress     Patient Stated     To pay more attention to dietary intake (pt-stated)        Care Coordination Interventions: Provided education to patient about basic DM disease process including potential complications secondary to uncontrolled diabetes Reviewed medications with patient and discussed importance of medication adherence Review of patient status, including review of consultants reports, relevant laboratory and other test results, and medications completed Counseled on Diabetic diet, my plate method, 409 minutes of moderate intensity exercise/week Confirmed patient received the PREP brochure, she has not looked at it but will do when she is ready  Determined patient is not checking her CBG's at home  Educated patient regarding the benefits of using continuous glucose monitoring via Dexcom and or Libre sensor, educated patient regarding how to apply the sensor, patient stated she will consider and let PCP know at next scheduled visit if she would like to proceed Reviewed next scheduled PCP follow up with Dr. Lenord Fellers set for 07/16/22 @3 :00 PM for an annual wellness visit  Assessed patient's understanding of A1c goal: <7% Lab Results  Component Value Date   HGBA1C 10.9 (H) 03/16/2022     Interventions Today    Flowsheet Row Most Recent Value  Chronic Disease   Chronic disease during today's visit Diabetes  General Interventions   General Interventions Discussed/Reviewed General Interventions Discussed, General Interventions  Reviewed, Labs, Doctor Visits, Communication with  Doctor Visits Discussed/Reviewed Doctor Visits Discussed, Doctor Visits Reviewed, PCP, Specialist  Communication with PCP/Specialists  [Dr. Lenord Fellers (sent email)]  Exercise Interventions   Exercise Discussed/Reviewed Exercise Discussed, Exercise Reviewed, Physical Activity  Physical Activity Discussed/Reviewed PREP, Physical Activity Reviewed, Physical Activity Discussed  Education Interventions   Education Provided Provided Education  Provided Verbal Education On Labs, When to see the doctor, Blood Sugar Monitoring, Exercise  Labs Reviewed Hgb A1c          SDOH assessments and interventions completed:  No     Care Coordination Interventions:  Yes, provided   Follow up plan: Follow up call scheduled for 07/26/22 @09 :00 AM    Encounter Outcome:  Pt. Visit Completed

## 2022-05-13 ENCOUNTER — Ambulatory Visit: Payer: HMO | Attending: Cardiology | Admitting: Cardiology

## 2022-05-13 ENCOUNTER — Encounter: Payer: Self-pay | Admitting: Cardiology

## 2022-05-13 VITALS — BP 132/84 | HR 103 | Ht 61.25 in | Wt 262.0 lb

## 2022-05-13 DIAGNOSIS — I4891 Unspecified atrial fibrillation: Secondary | ICD-10-CM | POA: Diagnosis not present

## 2022-05-13 DIAGNOSIS — I517 Cardiomegaly: Secondary | ICD-10-CM | POA: Diagnosis not present

## 2022-05-13 DIAGNOSIS — I482 Chronic atrial fibrillation, unspecified: Secondary | ICD-10-CM

## 2022-05-13 MED ORDER — APIXABAN 5 MG PO TABS: 5.0000 mg | ORAL_TABLET | Freq: Two times a day (BID) | ORAL | 3 refills | Status: DC

## 2022-05-13 NOTE — Patient Instructions (Addendum)
Medication Instructions:  Your physician recommends that you continue on your current medications as directed. Please refer to the Current Medication list given to you today.  *If you need a refill on your cardiac medications before your next appointment, please call your pharmacy*   Lab Work: Your physician recommends that you have labs drawn today: CBC  If you have labs (blood work) drawn today and your tests are completely normal, you will receive your results only by: MyChart Message (if you have MyChart) OR A paper copy in the mail If you have any lab test that is abnormal or we need to change your treatment, we will call you to review the results.   Follow-Up: At [facility], you and your health needs are our priority.  As part of our continuing mission to provide you with exceptional heart care, we have created designated Provider Care Teams.  These Care Teams include your primary Cardiologist (physician) and Advanced Practice Providers (APPs -  Physician Assistants and Nurse Practitioners) who all work together to provide you with the care you need, when you need it.  We recommend signing up for the patient portal called "MyChart".  Sign up information is provided on this After Visit Summary.  MyChart is used to connect with patients for Virtual Visits (Telemedicine).  Patients are able to view lab/test results, encounter notes, upcoming appointments, etc.  Non-urgent messages can be sent to your provider as well.   To learn more about what you can do with MyChart, go to [email or web address].    Your next appointment:   12 month(s)  Provider:   James Hochrein, MD   

## 2022-05-14 ENCOUNTER — Encounter: Payer: Self-pay | Admitting: *Deleted

## 2022-05-14 LAB — CBC
Hematocrit: 49.3 % — ABNORMAL HIGH (ref 34.0–46.6)
Hemoglobin: 16.1 g/dL — ABNORMAL HIGH (ref 11.1–15.9)
MCH: 30 pg (ref 26.6–33.0)
MCHC: 32.7 g/dL (ref 31.5–35.7)
MCV: 92 fL (ref 79–97)
Platelets: 330 10*3/uL (ref 150–450)
RBC: 5.37 x10E6/uL — ABNORMAL HIGH (ref 3.77–5.28)
RDW: 13.1 % (ref 11.7–15.4)
WBC: 8.2 10*3/uL (ref 3.4–10.8)

## 2022-05-14 NOTE — Progress Notes (Signed)
Patient Care Team: Margaree Mackintosh, MD as PCP - General (Internal Medicine) Rollene Rotunda, MD as PCP - Cardiology (Cardiology) Riley Churches, RN as [facility] Care Management  Visit Date: 05/21/22  Subjective:    Patient ID: Ebony Ashley , Female   DOB: [birth year], 77 y.o.    MRN: [id number]   77 y.o. Female presents today for medication counseling regarding diabetes. History of Type 2 diabetes treated with metformin 850 mg daily, Januvia 100 mg daily, glipizide 10 mg daily with breakfast. Reports she is compliant with these. HGBA1c at 10.9% on 03/16/2022, 7.8% on 07/02/21. Reports she has stopped eating donuts, burgers. Eats egg biscuit for breakfast, no lunch. She does not check her blood sugar at home.  Followed by cardiologist, Dr. Antoine Poche, for atrial fibrillation. Taking Eliquis 5 mg twice daily.  Past Medical History:  Diagnosis Date   Atrial fibrillation (HCC)    Diabetes mellitus    Esophageal spasm    Esophagitis    Hiatal hernia    Hypertension      Family History  Problem Relation Age of Onset   Arthritis Mother    Atrial fibrillation Mother 36   Fibromyalgia Mother    Osteoporosis Mother    Colon cancer Mother    Hypertension Brother    Diabetes Brother    Asthma Brother     Social History   Social History Narrative   Not on file      Review of Systems  Constitutional:  Negative for fever and malaise/fatigue.  HENT:  Negative for congestion.   Eyes:  Negative for blurred vision.  Respiratory:  Negative for cough and shortness of breath.   Cardiovascular:  Negative for chest pain, palpitations and leg swelling.  Gastrointestinal:  Negative for vomiting.  Musculoskeletal:  Negative for back pain.  Skin:  Negative for rash.  Neurological:  Negative for loss of consciousness and headaches.        Objective:   Vitals: BP 116/76   Pulse 84   Temp 99 F (37.2 C) (Tympanic)   Ht 5' 1.25" (1.556 m)   Wt 264 lb (119.7 kg)    LMP 01/05/1988 (Approximate)   SpO2 97%   BMI 49.48 kg/m    Physical Exam Vitals and nursing note reviewed.  Constitutional:      General: She is not in acute distress.    Appearance: Normal appearance. She is not toxic-appearing.  HENT:     Head: Normocephalic and atraumatic.  Cardiovascular:     Rate and Rhythm: Normal rate and regular rhythm. No extrasystoles are present.    Pulses: Normal pulses.     Heart sounds: Normal heart sounds. No murmur heard.    No friction rub. No gallop.  Pulmonary:     Effort: Pulmonary effort is normal. No respiratory distress.     Breath sounds: Normal breath sounds. No wheezing or rales.  Skin:    General: Skin is warm and dry.  Neurological:     Mental Status: She is alert and oriented to person, place, and time. Mental status is at baseline.  Psychiatric:        Mood and Affect: Mood normal.        Behavior: Behavior normal.        Thought Content: Thought content normal.        Judgment: Judgment normal.       Results:   Studies obtained and personally reviewed by me:   Labs:  Component Value Date/Time   NA 141 07/02/2021 0924   K 4.9 07/02/2021 0924   CL 102 07/02/2021 0924   CO2 28 07/02/2021 0924   GLUCOSE 206 (H) 07/02/2021 0924   BUN 10 07/02/2021 0924   CREATININE 0.74 07/02/2021 0924   CALCIUM 9.5 07/02/2021 0924   PROT 6.6 01/14/2022 0940   ALBUMIN 4.2 04/08/2015 0904   AST 8 (L) 01/14/2022 0940   ALT 10 01/14/2022 0940   ALKPHOS 130 04/08/2015 0904   BILITOT 0.5 01/14/2022 0940   GFRNONAA 73 05/27/2020 0934   GFRAA 85 05/27/2020 0934     Lab Results  Component Value Date   WBC 8.2 05/13/2022   HGB 16.1 (H) 05/13/2022   HCT 49.3 (H) 05/13/2022   MCV 92 05/13/2022   PLT 330 05/13/2022    Lab Results  Component Value Date   CHOL 151 01/14/2022   HDL 43 (L) 01/14/2022   LDLCALC 90 01/14/2022   TRIG 86 01/14/2022   CHOLHDL 3.5 01/14/2022    Lab Results  Component Value Date   HGBA1C 10.9  (H) 03/16/2022     Lab Results  Component Value Date   TSH 1.74 07/02/2021      Assessment & Plan:   Type 2 diabetes: treated with metformin 850 mg daily, Januvia 100 mg daily, glipizide 10 mg daily with breakfast. Reports she is compliant with these. HGBA1c at 10.9% on 03/16/2022, 7.8% on 07/02/21. Reports she has stopped eating donuts, burgers. Eats egg biscuit for breakfast, no lunch. She does not check her blood sugar at home. Ordered A1c.    I,Alexander Ruley,acting as a Neurosurgeon for Margaree Mackintosh, MD.,have documented all relevant documentation on the behalf of Margaree Mackintosh, MD,as directed by  Margaree Mackintosh, MD while in the presence of Margaree Mackintosh, MD.   ***

## 2022-05-21 ENCOUNTER — Ambulatory Visit (INDEPENDENT_AMBULATORY_CARE_PROVIDER_SITE_OTHER): Payer: PPO | Admitting: Internal Medicine

## 2022-05-21 VITALS — BP 116/76 | HR 84 | Temp 99.0°F | Ht 61.25 in | Wt 264.0 lb

## 2022-05-21 DIAGNOSIS — Z602 Problems related to living alone: Secondary | ICD-10-CM

## 2022-05-21 DIAGNOSIS — E785 Hyperlipidemia, unspecified: Secondary | ICD-10-CM

## 2022-05-21 DIAGNOSIS — E1169 Type 2 diabetes mellitus with other specified complication: Secondary | ICD-10-CM

## 2022-05-21 DIAGNOSIS — I10 Essential (primary) hypertension: Secondary | ICD-10-CM

## 2022-05-21 DIAGNOSIS — E7439 Other disorders of intestinal carbohydrate absorption: Secondary | ICD-10-CM

## 2022-05-21 NOTE — Patient Instructions (Addendum)
Hgb AIC drawn and result is 11.2%.  We will add metformin to Januvia.  She is not motivated to watch her diet.  She resides alone.  I will have my CMA contact her daughter with these results and concerns.  Patient says she is not moving to the [facility] area for several more months.

## 2022-05-22 LAB — HEMOGLOBIN A1C
Hgb A1c MFr Bld: 11.2 % of total Hgb — ABNORMAL HIGH (ref <5.7)
Mean Plasma Glucose: 275 mg/dL
eAG (mmol/L): 15.2 mmol/L

## 2022-05-23 ENCOUNTER — Encounter: Payer: Self-pay | Admitting: Internal Medicine

## 2022-05-24 ENCOUNTER — Telehealth: Payer: Self-pay

## 2022-05-24 ENCOUNTER — Ambulatory Visit: Payer: Self-pay

## 2022-05-24 ENCOUNTER — Other Ambulatory Visit: Payer: Self-pay

## 2022-05-24 ENCOUNTER — Telehealth: Payer: Self-pay | Admitting: Internal Medicine

## 2022-05-24 DIAGNOSIS — E1169 Type 2 diabetes mellitus with other specified complication: Secondary | ICD-10-CM

## 2022-05-24 DIAGNOSIS — I10 Essential (primary) hypertension: Secondary | ICD-10-CM

## 2022-05-24 MED ORDER — DEXCOM G7 SENSOR MISC: 1 refills | Status: AC

## 2022-05-24 MED ORDER — SITAGLIPTIN PHOSPHATE 100 MG PO TABS: 100.0000 mg | ORAL_TABLET | Freq: Every day | ORAL | 1 refills | Status: AC

## 2022-05-24 MED ORDER — DEXCOM G7 RECEIVER DEVI: 0 refills | Status: AC

## 2022-05-24 MED ORDER — METFORMIN HCL 500 MG PO TABS: 500.0000 mg | ORAL_TABLET | Freq: Two times a day (BID) | ORAL | 1 refills | Status: DC

## 2022-05-24 NOTE — Telephone Encounter (Signed)
Dexcom Rx sent to the pharmacy

## 2022-05-24 NOTE — Patient Instructions (Signed)
Visit Information  Thank you for taking time to visit with me today. Please don't hesitate to contact me if I can be of assistance to you.   Following are the goals we discussed today:   Goals Addressed             This Visit's Progress    RN Care Coordination Activities: further follow up needed       Care Coordination Interventions: Received a message from PCP provider, Dr. Marlan Palau regarding patient's need for a Dexcom sensor, she may also benefit from f/u with a SW to discuss ALF options  Sent [id number] requesting pharmacy and SW assistance Scheduled patient for an RN CC follow up call on 06/03/22 @2 :30 PM Sent an in basket message to Dr. Lenord Fellers to advise of Medicare Criteria for continuous glucose monitoring, advised of [facility] referral requesting pharmacy and SW assistance         Our next appointment is by telephone on 06/03/22 at 2:30 PM  Please call the care guide team at [phone] if you need to cancel or reschedule your appointment.   If you are experiencing a Mental Health or Behavioral Health Crisis or need someone to talk to, please call 1-800-273-TALK (toll free, 24 hour hotline) go to [facility] [address], [place] [phone])  The patient verbalized understanding of instructions, educational materials, and care plan provided today and DECLINED offer to receive copy of patient instructions, educational materials, and care plan.   Delsa Sale, RN, BSN, CCM Care Management Coordinator [facility] Care Management Direct Phone: [phone]

## 2022-05-24 NOTE — Patient Outreach (Signed)
  Care Coordination   Follow Up Visit Note   05/24/2022 Name: ELLIZA DENNLER MRN: [id number] DOB: [birth year]  SKYLAH MARSTON is a 77 y.o. year old female who sees Baxley, Luanna Cole, MD for primary care. I collaborated with Dr. Lenord Fellers today regarding patient's needs for a Dexcom sensor. Discussed patient may also benefit from speaking with a SW regarding ALF options.  What matters to the patients health and wellness today?  N/a    Goals Addressed             This Visit's Progress    RN Care Coordination Activities: further follow up needed       Care Coordination Interventions: Received a message from PCP provider, Dr. Marlan Palau regarding patient's need for a Dexcom sensor, she may also benefit from f/u with a SW to discuss ALF options  Sent [id number] requesting pharmacy and SW assistance Scheduled patient for an RN CC follow up call on 06/03/22 @2 :30 PM Sent an in basket message to Dr. Lenord Fellers to advise of Medicare Criteria for continuous glucose monitoring, advised of [facility] referral requesting pharmacy and SW assistance      Interventions Today    Flowsheet Row Most Recent Value  General Interventions   General Interventions Discussed/Reviewed Communication with  Communication with PCP/Specialists, Social Work  [Dr. Lenord Fellers,  Bevelyn Ngo BSW]  Pharmacy Interventions   Pharmacy Dicussed/Reviewed Referral to Pharmacist, Pharmacy Topics Reviewed, Pharmacy Topics Discussed  [phone] [facility] pharmacy referral sent]          SDOH assessments and interventions completed:  No     Care Coordination Interventions:  Yes, provided   Follow up plan: Referral made to [id number] to assist with continuous glucose monitoring; SW referral sent to [facility] to assist with ALF Follow up call scheduled for 06/03/22 @2 :30 PM    Encounter Outcome:  Pt. Visit Completed

## 2022-05-24 NOTE — Telephone Encounter (Signed)
Have spoken with Patients's son by phone today as she had called and told us of a syncopal episode she had upon returning home after recent OV here on May 17.    She had told us then she did not eat breakfast before coming for the OV on May 17th.She said she felt Ok here during the office visit. We did not need her to fast.I do not know why she did that. Her Hgb AIC is concerning at 11.2 %. She says she is taking her meds. Her son would like for her to have a Dexcom meter. We can place an order but we have had difficulty with insurance approval if patients are not on insulin.  I think it is best that we place an Endocrinology referral. Son is not sure at this time when his mother will be moving to the [facility] area. I really need for  patient's son and daughter to step in and help [place] manage patient's blood glucose. I have had [facility] involved as well. Also, it may be time for her to look at other living options such as Assisted living. We will let [facility] know of these concerns.

## 2022-05-25 NOTE — Telephone Encounter (Signed)
Patient has an appointment with Dr Ocie Cornfield, with [facility] on 07/12/2022 at 3:00

## 2022-05-28 ENCOUNTER — Telehealth: Payer: Self-pay | Admitting: Pharmacist

## 2022-05-28 NOTE — Progress Notes (Signed)
[facility])  [facility] Quality Pharmacy Team  05/28/2022  ZANETTA POL 11-19-1945 [id number]  Reason for referral: Medication Assistance with [facility] pharmacy referral is being closed due to the following reasons:  -I spoke with Mrs. Sandefer and confirmed her insurance.  Based on [facility] current formulary, Dexcom would not be covered, Josephine Igo is their preferred system.  Mrs. Roden stated she is going to see an endocrinologist in a few weeks and was not ready to make a firm decision on how to proceed with monitoring.  I provided her with my phone number and told her to contact me once she makes the decision or has additional questions.  [facility] is happy to assist the patient/family in the future for clinical pharmacy needs, following a discussion from your team about [facility] outreach.   Thank you for allowing [facility] pharmacy to be a part of this patient's care.  Dellie Burns, PharmD [facility]  [facility] Clinical Pharmacist Office: [phone]

## 2022-06-03 ENCOUNTER — Ambulatory Visit: Payer: Self-pay

## 2022-06-03 NOTE — Patient Outreach (Signed)
Care Coordination   Follow Up Visit Note   06/03/2022 Name: Ebony Ashley MRN: [id number] DOB: 10-25-1945  Ebony Ashley is a 77 y.o. year old female who sees Baxley, Ebony Cole, MD for primary care. I spoke with  Ebony Ashley by phone today.  What matters to the patients health and wellness today?  Patient would like to follow up with Ebony Ashley, Endocrinologist to evaluate and treat her diabetes.     Goals Addressed               This Visit's Progress     Patient Stated     To pay more attention to dietary intake (pt-stated)        Care Coordination Interventions: Provided education to patient about basic DM disease process including potential complications secondary to uncontrolled diabetes Review of patient status, including review of consultants reports, relevant laboratory and other test results, and medications completed Counseled on Diabetic diet, my plate method, 829 minutes of moderate intensity exercise/week Confirmed patient received the PREP brochure, she has not looked at it but will do when she is ready  Assessed patient's understanding of A1c goal:  <7.5 % Lab Results  Component Value Date   HGBA1C 11.2 (H) 05/21/2022       Other     RN Care Coordination Activities: further follow up needed        Care Coordination Interventions: Reviewed and discussed with patient the following update per Ebony Ashley Pharmacist;  Per [facility] Josephine Igo is their preferred system.  Determined patient does not wish to make a decision about the Josephine Igo until after her evaluation with Endocrinology scheduled for 07/12/22 Determined patient is concerned she may have an allergic reaction to the adhesive from the [place] due to having a reaction to the Zio patch in the past  Educated patient regarding the new and improved sensors and encouraged her to give it a try  Educated patient on the benefits of using continuous glucose monitoring to help her learn how her body responds to the  foods she eats as well as how her body will respond to exercise, physical activity, stress, etc.  Encouraged patient to contact the [facility] pharmacist, Ebony Ashley at phone number [phone] when ready to process the order for the [facility] and patient verbalizes understanding       Interventions Today    Flowsheet Row Most Recent Value  Chronic Disease   Chronic disease during today's visit Diabetes  General Interventions   General Interventions Discussed/Reviewed General Interventions Discussed, General Interventions Reviewed, Labs, Doctor Visits, Communication with  Doctor Visits Discussed/Reviewed PCP, Doctor Visits Reviewed, Doctor Visits Discussed, Specialist  Communication with --  [facility] Care Management Care Guide for transportation]  Exercise Interventions   Exercise Discussed/Reviewed Physical Activity, Exercise Reviewed, Exercise Discussed  Physical Activity Discussed/Reviewed Physical Activity Discussed, Physical Activity Reviewed, PREP  Education Interventions   Education Provided Provided Education  Provided Verbal Education On Blood Sugar Monitoring, Nutrition, Labs, Exercise  Labs Reviewed Hgb A1c  Nutrition Interventions   Nutrition Discussed/Reviewed Nutrition Discussed, Nutrition Reviewed, Carbohydrate meal planning, Portion sizes  Pharmacy Interventions   Pharmacy Dicussed/Reviewed Pharmacy Topics Discussed, Pharmacy Topics Reviewed          SDOH assessments and interventions completed:  No     Care Coordination Interventions:  Yes, provided   Follow up plan: Follow up call scheduled for 07/26/22 @09 :00 AM    Encounter Outcome:  Pt. Visit Completed

## 2022-06-03 NOTE — Patient Instructions (Signed)
Visit Information  Thank you for taking time to visit with me today. Please don't hesitate to contact me if I can be of assistance to you.   Following are the goals we discussed today:   Goals Addressed               This Visit's Progress     Patient Stated     To pay more attention to dietary intake (pt-stated)        Care Coordination Interventions: Provided education to patient about basic DM disease process including potential complications secondary to uncontrolled diabetes Review of patient status, including review of consultants reports, relevant laboratory and other test results, and medications completed Counseled on Diabetic diet, my plate method, 161 minutes of moderate intensity exercise/week Confirmed patient received the PREP brochure, she has not looked at it but will do when she is ready  Assessed patient's understanding of A1c goal:  <7.5 % Lab Results  Component Value Date   HGBA1C 11.2 (H) 05/21/2022       Other     RN Care Coordination Activities: further follow up needed        Care Coordination Interventions: Reviewed and discussed with patient the following update per Dellie Burns Pharmacist;  Per [facility] Josephine Igo is their preferred system.  Determined patient does not wish to make a decision about the Josephine Igo until after her evaluation with Endocrinology scheduled for 07/12/22 Determined patient is concerned she may have an allergic reaction to the adhesive from the [place] due to having a reaction to the Zio patch in the past  Educated patient regarding the new and improved sensors and encouraged her to give it a try  Educated patient on the benefits of using continuous glucose monitoring to help her learn how her body responds to the foods she eats as well as how her body will respond to exercise, physical activity, stress, etc.  Encouraged patient to contact the [facility] pharmacist, Rebeca Allegra D at phone number [phone]  when ready to process the order for the [facility] and patient verbalizes understanding         Our next appointment is by telephone on 07/26/22 at 09:00 AM  Please call the care guide team at [phone] if you need to cancel or reschedule your appointment.   If you are experiencing a Mental Health or Behavioral Health Crisis or need someone to talk to, please call 1-800-273-TALK (toll free, 24 hour hotline)  The patient verbalized understanding of instructions, educational materials, and care plan provided today and DECLINED offer to receive copy of patient instructions, educational materials, and care plan.   Delsa Sale, RN, BSN, CCM Care Management Coordinator [facility] Care Management  Direct Phone: [phone]

## 2022-06-10 NOTE — Progress Notes (Shared)
    Patient Care Team: Margaree Mackintosh, MD as PCP - General (Internal Medicine) Rollene Rotunda, MD as PCP - Cardiology (Cardiology) Riley Churches, RN as [facility] Care Management  Visit Date: 06/10/22  Subjective:    Patient ID: Ebony Ashley , Female   DOB: [birth year], 77 y.o.    MRN: [id number]   77 y.o. Female presents today for an A1c check. History of Type 2 diabetes treated with Januvia 100 mg daily, glipizide 10 mg daily with breakfast, metformin 500 mg twice daily.   Past Medical History:  Diagnosis Date   Atrial fibrillation (HCC)    Diabetes mellitus    Esophageal spasm    Esophagitis    Hiatal hernia    Hypertension      Family History  Problem Relation Age of Onset   Arthritis Mother    Atrial fibrillation Mother 45   Fibromyalgia Mother    Osteoporosis Mother    Colon cancer Mother    Hypertension Brother    Diabetes Brother    Asthma Brother     Social History   Social History Narrative   Not on file      ROS      Objective:   Vitals: LMP 01/05/1988 (Approximate)    Physical Exam    Results:   Studies obtained and personally reviewed by me:  Imaging, colonoscopy, mammogram, bone density scan, echocardiogram, heart cath, stress test, CT calcium score, etc. ***   Labs:       Component Value Date/Time   NA 141 07/02/2021 0924   K 4.9 07/02/2021 0924   CL 102 07/02/2021 0924   CO2 28 07/02/2021 0924   GLUCOSE 206 (H) 07/02/2021 0924   BUN 10 07/02/2021 0924   CREATININE 0.74 07/02/2021 0924   CALCIUM 9.5 07/02/2021 0924   PROT 6.6 01/14/2022 0940   ALBUMIN 4.2 04/08/2015 0904   AST 8 (L) 01/14/2022 0940   ALT 10 01/14/2022 0940   ALKPHOS 130 04/08/2015 0904   BILITOT 0.5 01/14/2022 0940   GFRNONAA 73 05/27/2020 0934   GFRAA 85 05/27/2020 0934     Lab Results  Component Value Date   WBC 8.2 05/13/2022   HGB 16.1 (H) 05/13/2022   HCT 49.3 (H) 05/13/2022   MCV 92 05/13/2022   PLT 330 05/13/2022     Lab Results  Component Value Date   CHOL 151 01/14/2022   HDL 43 (L) 01/14/2022   LDLCALC 90 01/14/2022   TRIG 86 01/14/2022   CHOLHDL 3.5 01/14/2022    Lab Results  Component Value Date   HGBA1C 11.2 (H) 05/21/2022     Lab Results  Component Value Date   TSH 1.74 07/02/2021     No results found for: "PSA1", "PSA" *** delete for female pts  ***    Assessment & Plan:   ***    I,Alexander Ruley,acting as a scribe for Margaree Mackintosh, MD.,have documented all relevant documentation on the behalf of Margaree Mackintosh, MD,as directed by  Margaree Mackintosh, MD while in the presence of Margaree Mackintosh, MD.   ***

## 2022-06-14 ENCOUNTER — Ambulatory Visit: Payer: Self-pay

## 2022-06-14 ENCOUNTER — Telehealth: Payer: Self-pay

## 2022-06-14 ENCOUNTER — Telehealth: Payer: Self-pay | Admitting: *Deleted

## 2022-06-14 DIAGNOSIS — E1169 Type 2 diabetes mellitus with other specified complication: Secondary | ICD-10-CM

## 2022-06-14 DIAGNOSIS — I10 Essential (primary) hypertension: Secondary | ICD-10-CM

## 2022-06-14 NOTE — Patient Instructions (Signed)
Visit Information  Thank you for taking time to visit with me today. Please don't hesitate to contact me if I can be of assistance to you.   Following are the goals we discussed today:   Goals Addressed               This Visit's Progress     Patient Stated     To pay more attention to dietary intake (pt-stated)        Care Coordination Interventions: Evaluation of current treatment plan related to type 2 diabetes mellitus and patient's adherence to plan as established by provider Inbound call received from patient requesting assistance with medical transportation for her initial consultation with Dr. Ocie Cornfield, Endocrinologist scheduled for today at 3 PM Attempted to send urgent [id number] referral without success due to patient is missing green banner and a hard stop was received  Collaborated with Olean Ree via teams message and [facility] Guide regarding referral and advised of patient's request for assistance with transportation for today's MD visit, provided the location and time Advised patient a notice is usually required and this RN is unsure if assistance will be available upon short notice Discussed with patient, if [facility] cannot assist with transportation, she will plan to drive herself and leave early enough to allow time for parking and locating the provider's office Confirmed with patient she has the correct address for the provider she is scheduled to see today  Assessed patient's understanding of A1c goal:  <7.5 % Lab Results  Component Value Date   HGBA1C 11.2 (H) 05/21/2022         Our next appointment is by telephone on 07/26/22 at 09:00 AM  Please call the care guide team at [phone] if you need to cancel or reschedule your appointment.   If you are experiencing a Mental Health or Behavioral Health Crisis or need someone to talk to, please call 1-800-273-TALK (toll free, 24 hour hotline)  The patient verbalized understanding of instructions,  educational materials, and care plan provided today and DECLINED offer to receive copy of patient instructions, educational materials, and care plan.   Delsa Sale, RN, BSN, CCM Care Management Coordinator [facility] Care Management Direct Phone: [phone]

## 2022-06-14 NOTE — Telephone Encounter (Signed)
   Telephone encounter was:  Successful.  06/14/2022 Name: Ebony Ashley MRN: [id number] DOB: 13-Oct-1945  Ebony Ashley is a 77 y.o. year old female who is a primary care patient of Baxley, Luanna Cole, MD . The community resource team was consulted for assistance with Transportation Needs   Care guide performed the following interventions: Spoke with patient to confirm pickup time today 2:30pm with [facility].  Follow Up Plan:  No further follow up planned at this time. The patient has been provided with needed resources.  06/14/2022  Ebony Ashley DOB: Oct 01, 1945 MRN: [id number]   RIDER WAIVER AND RELEASE OF LIABILITY  For the purposes of helping with transportation needs, [facility] partners with outside transportation providers (taxi companies, [place], Catering manager.) to give [employer] patients or other approved people the choice of on-demand rides [employer]") to our buildings for non-emergency visits.  By using [employer], I, the person signing this document, on behalf of myself and/or any legal minors (in my care using the [employer]), agree:  Science writer given to me are supplied by independent, outside transportation providers who do not work for, or have any affiliation with, [employer]. [place] is not a transportation company. [place] has no control over the quality or safety of the rides I get using [employer]. [place] has no control over whether any outside ride will happen on time or not. [place] gives no guarantee on the reliability, quality, safety, or availability on any rides, or that no mistakes will happen. I know and accept that traveling by vehicle (car, truck, SVU, Zenaida Niece, bus, taxi, etc.) has risks of serious injuries such as disability, being paralyzed, and death. I know and agree the risk of using [employer] is mine alone, and not [employer]. Transport Services are provided "as is" and as are available.  The transportation providers are in charge for all inspections and care of the vehicles used to provide these rides. I agree not to take legal action against [place], its agents, employees, officers, directors, representatives, insurers, attorneys, assigns, successors, subsidiaries, and affiliates at any time for any reasons related directly or indirectly to using [employer]. I also agree not to take legal action against [place] or its affiliates for any injury, death, or damage to property caused by or related to using [employer]. I have read this Waiver and Release of Liability, and I understand the terms used in it and their legal meaning. This Waiver is freely and voluntarily given with the understanding that my right (or any legal minors) to legal action against [place] relating to [employer] is knowingly given up to use these services.   I attest that I read the Ride Waiver and Release of Liability to Ebony Ashley, gave Ms. Gehling the opportunity to ask questions and answered the questions asked (if any). I affirm that Ebony Ashley then provided consent for assistance with transportation.     Chaeli Judy D Lezli Danek Alex Mcmanigal Stowell  [facility] Population Health Community Resource Care Guide   [email or web address]  ?? [phone]   Website: [email or web address]  [email or web address]

## 2022-06-14 NOTE — Patient Outreach (Signed)
  Care Coordination   06/14/2022 Name: ALERA QUEVEDO MRN: [id number] DOB: 01/26/1945   Care Coordination Outreach Attempts:  An unsuccessful telephone outreach was attempted for a scheduled appointment today.  Follow Up Plan:  Additional outreach attempts will be made to offer the patient care coordination information and services.   Encounter Outcome:  No Answer   Care Coordination Interventions:  No, not indicated    Delsa Sale, RN, BSN, CCM Care Management Coordinator [facility] Care Management  Direct Phone: [phone]

## 2022-06-14 NOTE — Patient Outreach (Addendum)
  Care Coordination   Follow Up Visit Note   06/14/2022 Name: Ebony Ashley MRN: [id number] DOB: 09/23/1945  Ebony Ashley is a 77 y.o. year old female who sees Baxley, Luanna Cole, MD for primary care. I spoke with  [name] by phone today.  What matters to the patients health and wellness today?  Patient is requesting assistance with medical transportation to her Endocrinology visit.     Goals Addressed               This Visit's Progress     Patient Stated     To pay more attention to dietary intake (pt-stated)        Care Coordination Interventions: Evaluation of current treatment plan related to type 2 diabetes mellitus and patient's adherence to plan as established by provider Inbound call received from patient requesting assistance with medical transportation for her initial consultation with Dr. Ocie Cornfield, Endocrinologist scheduled for today at 3 PM Attempted to send urgent [id number] referral without success due to patient is missing green banner and a hard stop was received  Collaborated with Olean Ree via teams message and [facility] Guide regarding referral and advised of patient's request for assistance with transportation for today's MD visit, provided the location and time Advised patient a notice is usually required and this RN is unsure if assistance will be available upon short notice Discussed with patient, if [facility] cannot assist with transportation, she will plan to drive herself and leave early enough to allow time for parking and locating the provider's office Confirmed with patient she has the correct address for the provider she is scheduled to see today  Assessed patient's understanding of A1c goal:  <7.5 % Lab Results  Component Value Date   HGBA1C 11.2 (H) 05/21/2022     Interventions Today    Flowsheet Row Most Recent Value  Chronic Disease   Chronic disease during today's visit Diabetes  General Interventions   General  Interventions Discussed/Reviewed General Interventions Reviewed, General Interventions Discussed, Communication with  Communication with Social Work  [facility] Care Guide for assistance with transportation]          SDOH assessments and interventions completed:  Yes  SDOH Interventions Today    Flowsheet Row Most Recent Value  SDOH Interventions   Transportation Interventions AMB Referral        Care Coordination Interventions:  Yes, provided   Follow up plan: Referral made to [facility] Care Guide for assistance with medical transportation Follow up call scheduled for 07/26/22 @09 :00 AM    Encounter Outcome:  Pt. Visit Completed

## 2022-06-14 NOTE — Telephone Encounter (Signed)
   Telephone encounter was:  Successful.  06/14/2022 Name: [name] MRN: [id number] DOB: 03-06-1945  Ebony Ashley is a 77 y.o. year old female who is a primary care patient of Baxley, Luanna Cole, MD . The community resource team was consulted for assistance with Transportation Needs   Care guide performed the following interventions: Patient provided with information about care guide support team and interviewed to confirm resource needs. Talked to patient and she was scheduled for an uber to pick her up and take her to her practice .  Follow Up Plan:  No further follow up planned at this time. The patient has been provided with needed resources.  Yehuda Mao Greenauer -[facility], Population Health [phone] [address] [place] , [place] [address] Email : [email or web address]

## 2022-06-15 ENCOUNTER — Telehealth: Payer: Self-pay | Admitting: Internal Medicine

## 2022-06-15 ENCOUNTER — Other Ambulatory Visit: Payer: PPO

## 2022-06-15 NOTE — Telephone Encounter (Signed)
Spoke with patient. She did not realize she had appt today for Hgb AIC check. Says she has dentitst appt and is leaving the house now. Has not been doing accuchecks. Says she will call for appt. Does not want to come by today for lab. Says [facility] is trying to help her get continuous glucose monitor.MJB, MD

## 2022-06-15 NOTE — Telephone Encounter (Signed)
Re-Scheduled appointments

## 2022-06-15 NOTE — Progress Notes (Unsigned)
Phone call to patient

## 2022-06-17 ENCOUNTER — Other Ambulatory Visit: Payer: PPO

## 2022-06-17 ENCOUNTER — Ambulatory Visit: Payer: PPO | Admitting: Internal Medicine

## 2022-06-17 DIAGNOSIS — E119 Type 2 diabetes mellitus without complications: Secondary | ICD-10-CM

## 2022-06-17 DIAGNOSIS — E7439 Other disorders of intestinal carbohydrate absorption: Secondary | ICD-10-CM

## 2022-06-18 LAB — HEMOGLOBIN A1C
Hgb A1c MFr Bld: 10 % of total Hgb — ABNORMAL HIGH (ref <5.7)
Mean Plasma Glucose: 240 mg/dL
eAG (mmol/L): 13.3 mmol/L

## 2022-06-18 NOTE — Progress Notes (Signed)
She has an appointment with Dr Ocie Cornfield on 07/06/2022

## 2022-06-21 ENCOUNTER — Encounter: Payer: Self-pay | Admitting: Internal Medicine

## 2022-06-21 ENCOUNTER — Ambulatory Visit (INDEPENDENT_AMBULATORY_CARE_PROVIDER_SITE_OTHER): Payer: PPO | Admitting: Internal Medicine

## 2022-06-21 VITALS — BP 132/98 | HR 103 | Temp 98.9°F | Resp 20 | Ht 61.25 in | Wt 265.0 lb

## 2022-06-21 DIAGNOSIS — B351 Tinea unguium: Secondary | ICD-10-CM

## 2022-06-21 DIAGNOSIS — E1169 Type 2 diabetes mellitus with other specified complication: Secondary | ICD-10-CM | POA: Diagnosis not present

## 2022-06-21 DIAGNOSIS — Z7901 Long term (current) use of anticoagulants: Secondary | ICD-10-CM

## 2022-06-21 DIAGNOSIS — I10 Essential (primary) hypertension: Secondary | ICD-10-CM

## 2022-06-21 DIAGNOSIS — I482 Chronic atrial fibrillation, unspecified: Secondary | ICD-10-CM

## 2022-06-21 DIAGNOSIS — E7439 Other disorders of intestinal carbohydrate absorption: Secondary | ICD-10-CM

## 2022-06-21 DIAGNOSIS — E785 Hyperlipidemia, unspecified: Secondary | ICD-10-CM

## 2022-06-21 DIAGNOSIS — Z7984 Long term (current) use of oral hypoglycemic drugs: Secondary | ICD-10-CM

## 2022-06-21 NOTE — Progress Notes (Signed)
Patient Care Team: Margaree Mackintosh, MD as PCP - General (Internal Medicine) Rollene Rotunda, MD as PCP - Cardiology (Cardiology) Clarene Duke Karma Lew, RN as [facility] Care Management Chucky May, M.D., PA  Visit Date: 06/21/22  Subjective:    Patient ID: Ebony Ashley , Female   DOB: May 31, 1945, 77 y.o.    MRN: [id number]   77 y.o. Female presents today for an A1c check and follow up on Diabetes mellitus.  History of Type 2 Diabetes mellitus treated with metformin 850 mg daily, Januvia 100 mg daily, glipizide 10 mg daily with breakfast. HGBA1c at 10.0% on 06/17/22, down from 11.2% on 05/21/22. Eats twice daily: sausage biscuit with small glass of orange juice for breakfast, salad for dinner. She is compliant with medications except ramipril, which she does not currently have.  Past Medical History:  Diagnosis Date   Atrial fibrillation (HCC)    Diabetes mellitus    Esophageal spasm    Esophagitis    Hiatal hernia    Hypertension      Family History  Problem Relation Age of Onset   Arthritis Mother    Atrial fibrillation Mother 67   Fibromyalgia Mother    Osteoporosis Mother    Colon cancer Mother    Hypertension Brother    Diabetes Brother    Asthma Brother     Social Hx: Widow resides alone. Son lives in the [place] area and daughter lives out of state.     Review of Systems  Constitutional:  Negative for fever and malaise/fatigue.  HENT:  Negative for congestion.   Eyes:  Negative for blurred vision.  Respiratory:  Negative for cough and shortness of breath.   Cardiovascular:  Negative for chest pain, palpitations and leg swelling.  Gastrointestinal:  Negative for vomiting.  Musculoskeletal:  Negative for back pain.  Skin:  Negative for rash.  Neurological:  Negative for loss of consciousness and headaches.        Objective:   Vitals: BP (!) 130/98   Pulse (!) 103   Temp 98.9 F (37.2 C) (Tympanic)   Resp 20   Ht 5' 1.25" (1.556  m)   Wt 265 lb (120.2 kg)   LMP 01/05/1988 (Approximate)   SpO2 95%   BMI 49.66 kg/m    Physical Exam Vitals and nursing note reviewed.  Constitutional:      General: She is not in acute distress.    Appearance: Normal appearance. She is not toxic-appearing.  HENT:     Head: Normocephalic and atraumatic.  Cardiovascular:     Rate and Rhythm: Normal rate and regular rhythm. No extrasystoles are present.    Pulses: Normal pulses.     Heart sounds: Normal heart sounds. No murmur heard.    No friction rub. No gallop.     Comments: Occasional irregular beat. Pulmonary:     Effort: Pulmonary effort is normal. No respiratory distress.     Breath sounds: Normal breath sounds. No wheezing or rales.  Skin:    General: Skin is warm and dry.  Neurological:     Mental Status: She is alert and oriented to person, place, and time. Mental status is at baseline.  Psychiatric:        Mood and Affect: Mood normal.        Behavior: Behavior normal.        Thought Content: Thought content normal.        Judgment: Judgment normal.  Results:   Studies obtained and personally reviewed by me:   Labs:       Component Value Date/Time   NA 141 07/02/2021 0924   K 4.9 07/02/2021 0924   CL 102 07/02/2021 0924   CO2 28 07/02/2021 0924   GLUCOSE 206 (H) 07/02/2021 0924   BUN 10 07/02/2021 0924   CREATININE 0.74 07/02/2021 0924   CALCIUM 9.5 07/02/2021 0924   PROT 6.6 01/14/2022 0940   ALBUMIN 4.2 04/08/2015 0904   AST 8 (L) 01/14/2022 0940   ALT 10 01/14/2022 0940   ALKPHOS 130 04/08/2015 0904   BILITOT 0.5 01/14/2022 0940   GFRNONAA 73 05/27/2020 0934   GFRAA 85 05/27/2020 0934     Lab Results  Component Value Date   WBC 8.2 05/13/2022   HGB 16.1 (H) 05/13/2022   HCT 49.3 (H) 05/13/2022   MCV 92 05/13/2022   PLT 330 05/13/2022    Lab Results  Component Value Date   CHOL 151 01/14/2022   HDL 43 (L) 01/14/2022   LDLCALC 90 01/14/2022   TRIG 86 01/14/2022   CHOLHDL  3.5 01/14/2022    Lab Results  Component Value Date   HGBA1C 10.0 (H) 06/17/2022     Lab Results  Component Value Date   TSH 1.74 07/02/2021      Assessment & Plan:   Type 2 diabetes mellitus: treated with metformin 850 mg daily, Januvia 100 mg daily, glipizide 10 mg daily with breakfast. Denies missing any doses. HGBA1c at 10.0% on 06/17/22, down from 11.2% on 05/21/22. Advised to inquire about foot care at next Endocrinologist appointment in July. OK to see Podiatrist for nail trimming. Patient is prone to dietary indiscretion and eating foods that are quick to obtain or easy to prepare. We have had [facility] involved in her care. She does have a Life Alert device in case she falls.  Chronic anticoagulation- high risk for CNS bleed if she falls.  Hyperlipidemia treated with low dose Crestor.  Chronic A-fib treated with Eliquis  BMI 49.66   I,Alexander Ruley,acting as a scribe for Margaree Mackintosh, MD.,have documented all relevant documentation on the behalf of Margaree Mackintosh, MD,as directed by  Margaree Mackintosh, MD while in the presence of Margaree Mackintosh, MD.   I, Margaree Mackintosh, MD, have reviewed all documentation for this visit. The documentation on 06/28/22 for the exam, diagnosis, procedures, and orders are all accurate and complete.

## 2022-06-21 NOTE — Patient Instructions (Addendum)
Patient reminded to take Ramipril daily. She is not sure she has been taking it but it was filled in April. Reviewed her meds.  She has an appointment to be seen by Endocrinologist in early July.  I am still concerned she is not being compliant with her medications.  She also needs diabetic footcare.  Has onychomycosis and dry feet.  Recommend podiatrist at [facility].  She does not cook much and that is an issue.  Eats fast food and something that is easy to prepare.  She resides alone.  Have spoken with her son who lives out of town recently regarding her diabetic control.  [facility] has been trying to work with her to get her a continuous glucose monitor.  Hemoglobin A1c remains elevated at 10% and she denies noncompliance with her medications.  Her annual Medicare wellness visit has been scheduled for July.

## 2022-06-22 ENCOUNTER — Encounter: Payer: Self-pay | Admitting: Internal Medicine

## 2022-06-22 ENCOUNTER — Other Ambulatory Visit: Payer: Self-pay

## 2022-06-22 ENCOUNTER — Emergency Department (HOSPITAL_COMMUNITY)
Admission: EM | Admit: 2022-06-22 | Discharge: 2022-06-22 | Disposition: A | Discharge status: Home / Self Care | Payer: PPO | Attending: Emergency Medicine | Admitting: Emergency Medicine

## 2022-06-22 ENCOUNTER — Emergency Department (HOSPITAL_COMMUNITY): Payer: PPO

## 2022-06-22 DIAGNOSIS — N390 Urinary tract infection, site not specified: Secondary | ICD-10-CM | POA: Insufficient documentation

## 2022-06-22 DIAGNOSIS — Z79899 Other long term (current) drug therapy: Secondary | ICD-10-CM | POA: Diagnosis not present

## 2022-06-22 DIAGNOSIS — W1839XA Other fall on same level, initial encounter: Secondary | ICD-10-CM | POA: Diagnosis not present

## 2022-06-22 DIAGNOSIS — I4891 Unspecified atrial fibrillation: Secondary | ICD-10-CM | POA: Insufficient documentation

## 2022-06-22 DIAGNOSIS — S0003XA Contusion of scalp, initial encounter: Secondary | ICD-10-CM | POA: Diagnosis not present

## 2022-06-22 DIAGNOSIS — E119 Type 2 diabetes mellitus without complications: Secondary | ICD-10-CM | POA: Insufficient documentation

## 2022-06-22 DIAGNOSIS — I10 Essential (primary) hypertension: Secondary | ICD-10-CM | POA: Diagnosis not present

## 2022-06-22 DIAGNOSIS — Z7984 Long term (current) use of oral hypoglycemic drugs: Secondary | ICD-10-CM | POA: Diagnosis not present

## 2022-06-22 DIAGNOSIS — Z7901 Long term (current) use of anticoagulants: Secondary | ICD-10-CM | POA: Diagnosis not present

## 2022-06-22 DIAGNOSIS — S0990XA Unspecified injury of head, initial encounter: Secondary | ICD-10-CM

## 2022-06-22 LAB — URINALYSIS, ROUTINE W REFLEX MICROSCOPIC
Bilirubin Urine: NEGATIVE
Glucose, UA: NEGATIVE mg/dL
Hgb urine dipstick: NEGATIVE
Ketones, ur: NEGATIVE mg/dL
Nitrite: NEGATIVE
Protein, ur: NEGATIVE mg/dL
Specific Gravity, Urine: 1.004 — ABNORMAL LOW (ref 1.005–1.030)
pH: 7 (ref 5.0–8.0)

## 2022-06-22 MED ORDER — CEPHALEXIN 500 MG PO CAPS: 500.0000 mg | ORAL_CAPSULE | Freq: Three times a day (TID) | ORAL | 0 refills | Status: AC

## 2022-06-22 NOTE — ED Provider Notes (Signed)
[facility] EMERGENCY DEPARTMENT AT [facility] Provider Note  CSN: [id number] Arrival date & time: 06/22/22 1312  Chief Complaint(s) Fall  HPI Ebony Ashley is a 77 y.o. female with past medical history as below, significant for A-fib on Eliquis, DM, HTN, pulmonary hypertension, uses walker who presents to the ED with complaint of fall, head injury.  Just prior to arrival patient reports that she was bending over to pick up crackers that she dropped on the floor, she attempted to sit down and missed the chair and fell backwards hitting her head on the ground.  No LOC, no chest pain, dyspnea, abdominal pain, nausea or vomiting.  Normal state health prior to fall.  Last dose of Eliquis was this morning.  reports pain to posterior portion of her head but otherwise no other complaints.  Past Medical History Past Medical History:  Diagnosis Date   Atrial fibrillation (HCC)    Diabetes mellitus    Esophageal spasm    Esophagitis    Hiatal hernia    Hypertension    Patient Active Problem List   Diagnosis Date Noted   Hyperlipidemia associated with type 2 diabetes mellitus (HCC) 07/27/2020   Anticoagulated 08/08/2019   Mild pulmonary hypertension (HCC) 08/08/2019   Atrial fibrillation (HCC) 05/07/2019   LVH (left ventricular hypertrophy) 05/07/2019   Educated about COVID-19 virus infection 05/07/2019   Urinary incontinence 09/10/2011   Metabolic syndrome 09/10/2011   Murmur 03/01/2011   Morbid obesity (HCC) 03/01/2011   Hypertension 11/04/2010   Non-insulin dependent type 2 diabetes mellitus (HCC) 11/04/2010   Esophagitis 11/04/2010   Esophageal spasm 11/04/2010   Dyspnea 11/04/2010   Home Medication(s) Prior to Admission medications   Medication Sig Start Date End Date Taking? Authorizing Provider  cephALEXin (KEFLEX) 500 MG capsule Take 1 capsule (500 mg total) by mouth 3 (three) times daily for 7 days. 06/22/22 06/29/22 Yes Tanda Rockers A, DO  amLODipine (NORVASC) 10  MG tablet TAKE 1 TABLET BY MOUTH EVERY DAY 02/20/22   Margaree Mackintosh, MD  apixaban (ELIQUIS) 5 MG TABS tablet Take 1 tablet (5 mg total) by mouth 2 (two) times daily. 05/13/22   Rollene Rotunda, MD  Continuous Glucose Receiver (DEXCOM G7 RECEIVER) DEVI Use as directed 05/24/22   Margaree Mackintosh, MD  Continuous Glucose Sensor (DEXCOM G7 SENSOR) MISC Apply 1 sensor every 14 days 05/24/22   Margaree Mackintosh, MD  glipiZIDE (GLUCOTROL XL) 10 MG 24 hr tablet TAKE 1 TABLET (10 MG TOTAL) BY MOUTH DAILY WITH BREAKFAST. 12/14/21   Margaree Mackintosh, MD  metFORMIN (GLUCOPHAGE) 500 MG tablet Take 1 tablet (500 mg total) by mouth 2 (two) times daily. 05/24/22   Margaree Mackintosh, MD  metoprolol succinate (TOPROL-XL) 100 MG 24 hr tablet TAKE 1 TABLET WITH OR IMMEDIATELY FOLLOWING A MEAL 02/20/22   Margaree Mackintosh, MD  nitroGLYCERIN (NITROSTAT) 0.4 MG SL tablet Place 0.4 mg under the tongue every 5 (five) minutes as needed.    [provider]  ramipril (ALTACE) 5 MG capsule TAKE 1 CAPSULE BY MOUTH EVERY DAY Patient not taking: Reported on 06/21/2022 04/18/22   Margaree Mackintosh, MD  rosuvastatin (CRESTOR) 5 MG tablet TAKE 1 TABLET (5 MG TOTAL) BY MOUTH 3 (THREE) TIMES A WEEK. MONDAYS, WEDNESDAYS AND FRIDAYS. 10/23/21   Margaree Mackintosh, MD  sitaGLIPtin (JANUVIA) 100 MG tablet Take 1 tablet (100 mg total) by mouth daily. 05/24/22   Margaree Mackintosh, MD  solifenacin (VESICARE) 10 MG tablet  TAKE 1/2 TABLET BY MOUTH DAILY 10/22/21   Margaree Mackintosh, MD  traMADol (ULTRAM) 50 MG tablet TAKE 1 TABLET (50 MG TOTAL) BY MOUTH EVERY 8 (EIGHT) HOURS AS NEEDED. *MAX 7 DAYS 10/21/20   Margaree Mackintosh, MD  triamcinolone (KENALOG) 0.1 % APPLY TO AFFECTED AREA TWICE A DAY 01/14/20   Margaree Mackintosh, MD                                                                                                                                    Past Surgical History Past Surgical History:  Procedure Laterality Date   ABDOMINAL HYSTERECTOMY  1990   DUB, ovaries  remain  Dr. Laurena Bering, off ERT 4/11   TONSILLECTOMY AND ADENOIDECTOMY  1951   Family History Family History  Problem Relation Age of Onset   Arthritis Mother    Atrial fibrillation Mother 45   Fibromyalgia Mother    Osteoporosis Mother    Colon cancer Mother    Hypertension Brother    Diabetes Brother    Asthma Brother     Social History Social History   Tobacco Use   Smoking status: Never   Smokeless tobacco: Never  Substance Use Topics   Alcohol use: Not Currently   Drug use: No   Allergies Adhesive [tape] and Polysporin [bacitracin-polymyxin b]  Review of Systems Review of Systems  Constitutional:  Negative for activity change and fever.  HENT:  Negative for facial swelling and trouble swallowing.   Eyes:  Negative for discharge and redness.  Respiratory:  Negative for cough and shortness of breath.   Cardiovascular:  Negative for chest pain and palpitations.  Gastrointestinal:  Negative for abdominal pain and nausea.  Genitourinary:  Negative for dysuria and flank pain.  Musculoskeletal:  Negative for back pain and gait problem.  Skin:  Negative for pallor and rash.  Neurological:  Positive for headaches. Negative for syncope.    Physical Exam Vital Signs  I have reviewed the triage vital signs BP (!) 133/98   Pulse 76   Temp 97.7 F (36.5 C) (Oral)   Resp (!) 25   Ht 5' 1.25" (1.556 m)   Wt 120.2 kg   LMP 01/05/1988 (Approximate)   SpO2 98%   BMI 49.66 kg/m  Physical Exam Vitals and nursing note reviewed.  Constitutional:      General: She is not in acute distress.    Appearance: Normal appearance.  HENT:     Head: Normocephalic. No raccoon eyes, Battle's sign, right periorbital erythema or left periorbital erythema.     Jaw: There is normal jaw occlusion. No trismus.      Comments: Hematoma occiput    Right Ear: External ear normal.     Left Ear: External ear normal.     Nose: Nose normal.     Mouth/Throat:     Mouth: Mucous membranes are  moist.  Eyes:  General: No scleral icterus.       Right eye: No discharge.        Left eye: No discharge.     Extraocular Movements: Extraocular movements intact.     Pupils: Pupils are equal, round, and reactive to light.  Cardiovascular:     Rate and Rhythm: Normal rate and regular rhythm.     Pulses: Normal pulses.     Heart sounds: Normal heart sounds.  Pulmonary:     Effort: Pulmonary effort is normal. No respiratory distress.     Breath sounds: Normal breath sounds.  Abdominal:     General: Abdomen is flat.     Palpations: Abdomen is soft.     Tenderness: There is no abdominal tenderness.  Musculoskeletal:        General: Normal range of motion.     Cervical back: No crepitus. No pain with movement.     Right lower leg: No edema.     Left lower leg: No edema.     Comments: Pelvis stable to ap pressure No sig pain w/ log roll BLLE No ttp to CTL spine, no crepitance or stepoff   Skin:    General: Skin is warm and dry.     Capillary Refill: Capillary refill takes less than 2 seconds.  Neurological:     Mental Status: She is alert and oriented to person, place, and time.     GCS: GCS eye subscore is 4. GCS verbal subscore is 5. GCS motor subscore is 6.     Cranial Nerves: Cranial nerves 2-12 are intact.     Sensory: Sensation is intact.     Motor: Motor function is intact.     Coordination: Coordination is intact.     Comments: Strength 5/5 BLUE BLLE   Psychiatric:        Mood and Affect: Mood normal.        Behavior: Behavior normal.     ED Results and Treatments Labs (all labs ordered are listed, but only abnormal results are displayed) Labs Reviewed  URINALYSIS, ROUTINE W REFLEX MICROSCOPIC - Abnormal; Notable for the following components:      Result Value   Specific Gravity, Urine 1.004 (*)    Leukocytes,Ua LARGE (*)    Bacteria, UA RARE (*)    All other components within normal limits  URINE CULTURE                                                                                                                           Radiology CT Head Wo Contrast  Result Date: 06/22/2022 CLINICAL DATA:  Fall, blood thinners EXAM: CT HEAD WITHOUT CONTRAST CT CERVICAL SPINE WITHOUT CONTRAST TECHNIQUE: Multidetector CT imaging of the head and cervical spine was performed following the standard protocol without intravenous contrast. Multiplanar CT image reconstructions of the cervical spine were also generated. RADIATION DOSE REDUCTION: This exam was performed according to the departmental dose-optimization program which includes automated exposure control,  adjustment of the mA and/or kV according to patient size and/or use of iterative reconstruction technique. COMPARISON:  None Available. FINDINGS: CT HEAD FINDINGS Brain: No evidence of acute infarction, hemorrhage, hydrocephalus, extra-axial collection or mass lesion/mass effect. Periventricular and deep white matter hypodensity. Vascular: No hyperdense vessel or unexpected calcification. Skull: Hyperostosis frontalis. Negative for fracture or focal lesion. Sinuses/Orbits: No acute finding. Other: None. CT CERVICAL SPINE FINDINGS Alignment: Normal. Skull base and vertebrae: No acute fracture. No primary bone lesion or focal pathologic process. Soft tissues and spinal canal: No prevertebral fluid or swelling. No visible canal hematoma. Disc levels: Focally mild disc space height loss and osteophytosis C6-C7 otherwise intact disc spaces. Upper chest: Negative. Other: None. IMPRESSION: 1. No acute intracranial pathology. Small-vessel white matter disease. 2. No fracture or static subluxation of the cervical spine. Electronically Signed   By: Jearld Lesch M.D.   On: 06/22/2022 14:22   CT Cervical Spine Wo Contrast  Result Date: 06/22/2022 CLINICAL DATA:  Fall, blood thinners EXAM: CT HEAD WITHOUT CONTRAST CT CERVICAL SPINE WITHOUT CONTRAST TECHNIQUE: Multidetector CT imaging of the head and cervical spine was performed  following the standard protocol without intravenous contrast. Multiplanar CT image reconstructions of the cervical spine were also generated. RADIATION DOSE REDUCTION: This exam was performed according to the departmental dose-optimization program which includes automated exposure control, adjustment of the mA and/or kV according to patient size and/or use of iterative reconstruction technique. COMPARISON:  None Available. FINDINGS: CT HEAD FINDINGS Brain: No evidence of acute infarction, hemorrhage, hydrocephalus, extra-axial collection or mass lesion/mass effect. Periventricular and deep white matter hypodensity. Vascular: No hyperdense vessel or unexpected calcification. Skull: Hyperostosis frontalis. Negative for fracture or focal lesion. Sinuses/Orbits: No acute finding. Other: None. CT CERVICAL SPINE FINDINGS Alignment: Normal. Skull base and vertebrae: No acute fracture. No primary bone lesion or focal pathologic process. Soft tissues and spinal canal: No prevertebral fluid or swelling. No visible canal hematoma. Disc levels: Focally mild disc space height loss and osteophytosis C6-C7 otherwise intact disc spaces. Upper chest: Negative. Other: None. IMPRESSION: 1. No acute intracranial pathology. Small-vessel white matter disease. 2. No fracture or static subluxation of the cervical spine. Electronically Signed   By: Jearld Lesch M.D.   On: 06/22/2022 14:22    Pertinent labs & imaging results that were available during my care of the patient were reviewed by me and considered in my medical decision making (see MDM for details).  Medications Ordered in ED Medications - No data to display                                                                                                                                   Procedures .Critical Care  Performed by: Sloan Leiter, DO Authorized by: Sloan Leiter, DO   Critical care provider statement:    Critical care time (minutes):  35   Critical care  time was exclusive of:  Separately billable procedures and treating other patients   Critical care was necessary to treat or prevent imminent or life-threatening deterioration of the following conditions:  Trauma   Critical care was time spent personally by me on the following activities:  Development of treatment plan with patient or surrogate, discussions with consultants, evaluation of patient's response to treatment, examination of patient, ordering and review of laboratory studies, ordering and review of radiographic studies, ordering and performing treatments and interventions, pulse oximetry, re-evaluation of patient's condition, review of old charts and obtaining history from patient or surrogate   (including critical care time)  Medical Decision Making / ED Course    Medical Decision Making:    Ebony Ashley is a 77 y.o. female with past medical history as below, significant for A-fib on Eliquis, DM, HTN, pulmonary hypertension, uses walker who presents to the ED with complaint of fall, head injury. The complaint involves an extensive differential diagnosis and also carries with it a high risk of complications and morbidity.  Serious etiology was considered. Ddx includes but is not limited to: Differential diagnoses for head trauma includes subdural hematoma, epidural hematoma, acute concussion, traumatic subarachnoid hemorrhage, cerebral contusions, etc.   Complete initial physical exam performed, notably the patient  was nad, neuro non focal, hematoma noted to occiput.    Reviewed and confirmed nursing documentation for past medical history, family history, social history.  Vital signs reviewed.      Patient arrives a level 2 trauma, primary service completed, airway intact, clear breath sounds bilateral, trachea midline.  Equal pulses in all 4 extremities.  Extremities warm well-perfused, HDS.  Hematoma noted to occiput, otherwise no other external evidence of trauma on  exam.  Neuro exam non focal  CT imaging of head/c-spine reviewed by myself and is without ICH or obvious fx. Agree w/ radiology interpretation  She is feeling better overall, has mild headache but neuro intact.  Uses walker at baseline, feels back to normal  Discussed concussion precautions w/ pt  Stable for discharge  At time of discharge pt requests check for uti, urgency/frequency. UA with borderline infection. Will send culture and start on keflex.     The patient improved significantly and was discharged in stable condition. Detailed discussions were had with the patient regarding current findings, and need for close f/u with PCP or on call doctor. The patient has been instructed to return immediately if the symptoms worsen in any way for re-evaluation. Patient verbalized understanding and is in agreement with current care plan. All questions answered prior to discharge.        Additional history obtained: -Additional history obtained from ems -External records from outside source obtained and reviewed including: Chart review including previous notes, labs, imaging, consultation notes including home meds, primary care documentation   Lab Tests: na  EKG   EKG Interpretation  Date/Time:    Ventricular Rate:    PR Interval:    QRS Duration:   QT Interval:    QTC Calculation:   R Axis:     Text Interpretation:           Imaging Studies ordered: I ordered imaging studies including CTH CTCS I independently visualized the following imaging with scope of interpretation limited to determining acute life threatening conditions related to emergency care; findings noted above, significant for no ICH I independently visualized and interpreted imaging. I agree with the radiologist interpretation   Medicines ordered and prescription drug management: Meds ordered this encounter  Medications  cephALEXin (KEFLEX) 500 MG capsule    Sig: Take 1 capsule (500 mg total)  by mouth 3 (three) times daily for 7 days.    Dispense:  21 capsule    Refill:  0    -I have reviewed the patients home medicines and have made adjustments as needed   Consultations Obtained: na   Cardiac Monitoring: The patient was maintained on a cardiac monitor.  I personally viewed and interpreted the cardiac monitored which showed an underlying rhythm of: SR  Social Determinants of Health:  Diagnosis or treatment significantly limited by social determinants of health: na   Reevaluation: After the interventions noted above, I reevaluated the patient and found that they have improved  Co morbidities that complicate the patient evaluation  Past Medical History:  Diagnosis Date   Atrial fibrillation (HCC)    Diabetes mellitus    Esophageal spasm    Esophagitis    Hiatal hernia    Hypertension       Dispostion: Disposition decision including need for hospitalization was considered, and patient discharged from emergency department.    Final Clinical Impression(s) / ED Diagnoses Final diagnoses:  Injury of head, initial encounter  Anticoagulated  Urinary tract infection without hematuria, site unspecified     This chart was dictated using voice recognition software.  Despite best efforts to proofread,  errors can occur which can change the documentation meaning.    Tanda Rockers A, DO 06/22/22 1629

## 2022-06-22 NOTE — ED Triage Notes (Signed)
PT BIB [facility] for a fall on Eliquis. Pt spilt some crackers. When standing up and trying sit down on chair, she missed chair, falling back and hitting head.  Pt is A&O x4.  160/100 HR 84 100% RA

## 2022-06-22 NOTE — ED Notes (Signed)
Pt asked about leaving without receiving dc paperwork. Explained to pt that their RN was busy at the moment and that there may be a delay in receiving paperwork. Pt states their ride is waiting for them and they cannot wait on the paperwork. Pt seen leaving the department.

## 2022-06-22 NOTE — Progress Notes (Signed)
Orthopedic Tech Progress Note Patient Details:  Ebony Ashley [birth year] [id number]  Level 2 trauma  Patient ID: [name], female   DOB: 1945-06-13, 77 y.o.   MRN: [id number]  [name] 06/22/2022, 2:13 PM

## 2022-06-22 NOTE — Progress Notes (Signed)
   06/22/22 1331  Spiritual Encounters  Type of Visit Initial  Care provided to: Patient  Conversation partners present during encounter Nurse  Referral source Trauma page  Reason for visit Trauma  OnCall Visit No   Chaplain responded to Fall on [employer]. Upon arrival PT seem lucid and was talkative.  Chaplain asked if there were any support persons present or on the way and PT indicated there were not.  Chaplain remained with PT a few minutes to assess and services were not needed.

## 2022-06-22 NOTE — Discharge Instructions (Addendum)
Based on the events which brought you to the ER today, it is possible that you may have a concussion. A concussion occurs when there is a blow to the head or body, with enough force to shake the brain and disrupt how the brain functions. You may experience symptoms such as headaches, sensitivity to light/noise, dizziness, cognitive slowing, difficulty concentrating / remembering, trouble sleeping and drowsiness. These symptoms may last anywhere from hours/days to potentially weeks/months. While these symptoms are very frustrating and perhaps debilitating, it is important that you remember that they will improve over time. Everyone has a different rate of recovery; it is difficult to predict when your symptoms will resolve. In order to allow for your brain to heal after the injury, we recommend that you see your primary physician or a physician knowledgeable in concussion management. We also advise you to let your body and brain rest: avoid physical activities (sports, gym, and exercise) and reduce cognitive demands (reading, texting, TV watching, computer use, video games, etc). School attendance, after-school activities and work may need to be modified to avoid increasing symptoms. We recommend against driving until until all symptoms have resolved. Come back to the ER right away if you are having repeated episodes of vomiting, severe/worsening headache/dizziness or any other symptom that alarms you. We recommended that someone stay with you for the next 24 hours to monitor for these worrisome symptoms.   It was a pleasure caring for you today in the emergency department.  Please return to the emergency department for any worsening or worrisome symptoms.  

## 2022-06-23 ENCOUNTER — Ambulatory Visit: Payer: Self-pay

## 2022-06-23 NOTE — Patient Outreach (Signed)
  Care Coordination   Follow Up Visit Note   06/23/2022 Name: Ebony Ashley MRN: [id number] DOB: May 22, 1945  Ebony Ashley is a 77 y.o. year old female who sees Baxley, Luanna Cole, MD for primary care. I spoke with  Ebony Ashley by phone today.  What matters to the patients health and wellness today?  Patient will contact her PCP to schedule a post ED follow up visit. She will seek medical attention for reoccurring falls.     Goals Addressed             This Visit's Progress    To avoid having further falls       Care Coordination Interventions: Received inbound call from patient requesting a return call  Determined patient experienced a fall in her home yesterday, 06/22/22 at which time she hit her head on the floor Determined patient called EMS who then transported her to the ED for further evaluation  Reviewed medications and discussed potential side effects of medications such as dizziness and frequent urination Review of patient status, including review of consultant's reports, relevant laboratory and other test results, and medications completed, instructed patient to complete her full course of Cephalexin for potential UTI and to ask her PCP for recommendations for a urine recheck approximately 2 weeks following her completion of the antibiotic Educated patient on concussion precautions and when to call the doctor if needed  Advised patient to schedule a one week f/u with her PCP provider for re-evaluation of her potential concussion and or sooner if needed Educated patient on the benefits of applying an ice pack to the back of head at the hematoma site, 20 minutes on/20 minutes off for comfort and to help reduce the size of the hematoma Provided written and verbal education re: potential causes of falls and Fall prevention strategies Advised patient of importance of notifying provider of falls      Interventions Today    Flowsheet Row Most Recent Value  Chronic  Disease   Chronic disease during today's visit Other  [fall with head injury]  General Interventions   General Interventions Discussed/Reviewed General Interventions Discussed, General Interventions Reviewed, Doctor Visits, Labs  Doctor Visits Discussed/Reviewed Doctor Visits Discussed, Doctor Visits Reviewed, Specialist  Education Interventions   Education Provided Provided Education  Pharmacy Interventions   Pharmacy Dicussed/Reviewed Pharmacy Topics Discussed, Pharmacy Topics Reviewed, Medications and their functions  Safety Interventions   Safety Discussed/Reviewed Safety Discussed, Safety Reviewed, Fall Risk, Home Safety  Home Safety Assistive Devices          SDOH assessments and interventions completed:  No     Care Coordination Interventions:  Yes, provided   Follow up plan: Follow up call scheduled for 06/30/22 @12 :30 PM    Encounter Outcome:  Pt. Visit Completed

## 2022-06-23 NOTE — Patient Instructions (Addendum)
Visit Information  Thank you for taking time to visit with me today. Please don't hesitate to contact me if I can be of assistance to you.   Following are the goals we discussed today:   Goals Addressed             This Visit's Progress    To avoid having further falls       Care Coordination Interventions: Received inbound call from patient requesting a return call  Determined patient experienced a fall in her home yesterday, 06/22/22 at which time she hit her head on the floor Determined patient called EMS who then transported her to the ED for further evaluation  Reviewed medications and discussed potential side effects of medications such as dizziness and frequent urination Review of patient status, including review of consultant's reports, relevant laboratory and other test results, and medications completed, instructed patient to complete her full course of Cephalexin for potential UTI and to ask her PCP for recommendations for a urine recheck approximately 2 weeks following her completion of the antibiotic Educated patient on concussion precautions and when to call the doctor if needed  Advised patient to schedule a one week f/u with her PCP provider for re-evaluation of her potential concussion and or sooner if needed Educated patient on the benefits of applying an ice pack to the back of head at the hematoma site, 20 minutes on/20 minutes off for comfort and to help reduce the size of the hematoma Provided written and verbal education re: potential causes of falls and Fall prevention strategies Advised patient of importance of notifying provider of falls          Our next appointment is by telephone on 06/30/22 at 12:30 PM  Please call the care guide team at [phone] if you need to cancel or reschedule your appointment.   If you are experiencing a Mental Health or Behavioral Health Crisis or need someone to talk to, please call 1-800-273-TALK (toll free, 24 hour  hotline)  The patient verbalized understanding of instructions, educational materials, and care plan provided today and DECLINED offer to receive copy of patient instructions, educational materials, and care plan.   Delsa Sale, RN, BSN, CCM Care Management Coordinator [facility] Care Management Direct Phone: [phone]

## 2022-06-24 ENCOUNTER — Telehealth: Payer: Self-pay

## 2022-06-24 LAB — URINE CULTURE: Culture: >=100000 — AB

## 2022-06-24 NOTE — Telephone Encounter (Signed)
Results have been relayed to the patient/authorized caretaker. The patient/authorized caretaker verbalized understanding. No questions at this time.   

## 2022-06-24 NOTE — Telephone Encounter (Signed)
Transition Care Management Follow-up Telephone Call Date of discharge and from where: ED only 6/18 for fall How have you been since you were released from the hospital? Doing better, no headache just pain upon touching back of head.  Any questions or concerns? No  Items Reviewed: Did the pt receive and understand the discharge instructions provided? Yes  Medications obtained and verified? No - Patient states that pharmacy didn't get the abx so she has not started it yet.  Other? No  Any new allergies since your discharge? No  Dietary orders reviewed? Yes Do you have support at home? Yes   Home Care and Equipment/Supplies: Were home health services ordered? not applicable If so, what is the name of the agency? N/a  Has the agency set up a time to come to the patient's home? not applicable Were any new equipment or medical supplies ordered?  No What is the name of the medical supply agency? N/a Were you able to get the supplies/equipment? not applicable Do you have any questions related to the use of the equipment or supplies? No  Functional Questionnaire: (I = Independent and D = Dependent) ADLs: I   Bathing/Dressing- I  Meal Prep- I  Eating- I  Maintaining continence- I  Transferring/Ambulation- I  Managing Meds- I  Follow up appointments reviewed:  PCP Hospital f/u appt confirmed? No  Patient will pick up abx and call to schedule 10 day fu once she starts taking the abx for UTI Specialist Hospital f/u appt confirmed? No   Are transportation arrangements needed? No  If their condition worsens, is the pt aware to call PCP or go to the Emergency Dept.? Yes Was the patient provided with contact information for the PCP's office or ED? Yes Was to pt encouraged to call back with questions or concerns? Yes  Culture on urine is back in chart. She has not heard back from them in regards to culture.

## 2022-06-25 LAB — URINE CULTURE

## 2022-06-26 ENCOUNTER — Telehealth (HOSPITAL_BASED_OUTPATIENT_CLINIC_OR_DEPARTMENT_OTHER): Payer: Self-pay | Admitting: *Deleted

## 2022-06-26 NOTE — Telephone Encounter (Signed)
Post ED Visit - Positive Culture Follow-up  Culture report reviewed by antimicrobial stewardship pharmacist: [facility] []  Enzo Bi, Pharm.D. []  Celedonio Miyamoto, Pharm.D., BCPS AQ-ID []  Garvin Fila, Pharm.D., BCPS []  Georgina Pillion, Pharm.D., BCPS []  [place], [address].D., BCPS, AAHIVP []  Estella Husk, Pharm.D., BCPS, AAHIVP []  Lysle Pearl, PharmD, BCPS []  Phillips Climes, PharmD, BCPS []  Agapito Games, PharmD, BCPS []  Verlan Friends, PharmD []  Mervyn Gay, PharmD, BCPS [x]  Riccardo Dubin, PharmD  [facility] []  Len Childs, PharmD []  Greer Pickerel, PharmD []  Adalberto Cole, PharmD []  Perlie Gold, Rph []  Lonell Face) Jean Rosenthal, PharmD []  Earl Many, PharmD []  Junita Push, PharmD []  Dorna Leitz, PharmD []  Terrilee Files, PharmD []  Lynann Beaver, PharmD []  Keturah Barre, PharmD []  Loralee Pacas, PharmD []  Bernadene Person, PharmD   Positive urine culture Treated with Cephalexin, organism sensitive to the same and no further patient follow-up is required at this time.  Ebony Ashley 06/26/2022, 1:36 PM

## 2022-06-30 ENCOUNTER — Ambulatory Visit: Payer: Self-pay

## 2022-06-30 NOTE — Patient Outreach (Signed)
  Care Coordination   Follow Up Visit Note   06/30/2022 Name: Ebony Ashley MRN: [id number] DOB: 1945/07/27  Ebony Ashley is a 77 y.o. year old female who sees Baxley, Luanna Cole, MD for primary care. I spoke with  Ebony Ashley by phone today.  What matters to the patients health and wellness today?  Patient would like to remain free from falls. She will keep her Endocrinology appointment as scheduled.     Goals Addressed             This Visit's Progress    RN Care Coordination Activities: further follow up needed       Care Coordination Interventions: Reviewed medications with patient and discussed importance of medication adherence Counseled on importance of regular laboratory monitoring as prescribed Reviewed scheduled/upcoming provider appointment including: initial follow up with [facility] scheduled for 07/12/22 @3PM  Assessed for transportation barriers, patient plans to drive although she reports having some anxiety about finding parking Reminded patient about scheduling transportation several days ahead of time in order to pre-arrange and prevent from missing her appointment, patient verbalizes understanding and is agreeable      To avoid having further falls       Care Coordination Interventions: Advised patient of importance of notifying provider of falls Assessed for falls since last encounter Assessed patients knowledge of fall risk prevention secondary to previously provided education Provided patient information for fall alert systems    Interventions Today    Flowsheet Row Most Recent Value  Chronic Disease   Chronic disease during today's visit Diabetes, Other  [s/p fall]  General Interventions   General Interventions Discussed/Reviewed General Interventions Discussed, General Interventions Reviewed, Doctor Visits, Durable Medical Equipment (DME)  Doctor Visits Discussed/Reviewed Doctor Visits Discussed, Doctor Visits Reviewed           SDOH assessments and interventions completed:  No     Care Coordination Interventions:  Yes, provided   Follow up plan: Follow up call scheduled for 07/27/22 @1 :00 PM     Encounter Outcome:  Pt. Visit Completed

## 2022-06-30 NOTE — Patient Instructions (Signed)
Visit Information  Thank you for taking time to visit with me today. Please don't hesitate to contact me if I can be of assistance to you.   Following are the goals we discussed today:   Goals Addressed             This Visit's Progress    RN Care Coordination Activities: further follow up needed       Care Coordination Interventions: Reviewed medications with patient and discussed importance of medication adherence Counseled on importance of regular laboratory monitoring as prescribed Reviewed scheduled/upcoming provider appointment including: initial follow up with [facility] Endocrinology scheduled for 07/12/22 @3PM  Assessed for transportation barriers, patient plans to drive although she reports having some anxiety about finding parking Reminded patient about scheduling transportation several days ahead of time in order to pre-arrange and prevent from missing her appointment, patient verbalizes understanding and is agreeable         To avoid having further falls       Care Coordination Interventions: Advised patient of importance of notifying provider of falls Assessed for falls since last encounter Assessed patients knowledge of fall risk prevention secondary to previously provided education Provided patient information for fall alert systems         Our next appointment is by telephone on 07/27/22 at 1:00 PM  Please call the care guide team at [phone] if you need to cancel or reschedule your appointment.   If you are experiencing a Mental Health or Behavioral Health Crisis or need someone to talk to, please call 1-800-273-TALK (toll free, 24 hour hotline)  The patient verbalized understanding of instructions, educational materials, and care plan provided today and DECLINED offer to receive copy of patient instructions, educational materials, and care plan.   Delsa Sale, RN, BSN, CCM Care Management Coordinator [facility] Care Management Direct Phone: [phone]

## 2022-07-02 NOTE — Progress Notes (Deleted)
Annual Wellness Visit    Patient Care Team: Lotus Gover, Luanna Cole, MD as PCP - General (Internal Medicine) Rollene Rotunda, MD as PCP - Cardiology (Cardiology) Clarene Duke Karma Lew, RN as [facility] Care Management Chucky May, M.D., PA  Visit Date: 07/02/22   No chief complaint on file.   Subjective:   Patient: Ebony Ashley, Female    DOB: 25-Feb-1945, 77 y.o.   MRN: [id number]  Ebony Ashley is a 77 y.o. Female who presents today for her Annual Wellness Visit.  History of Type 2 diabetes mellitus treated with Januvia 100 mg daily, glipizide 10 mg daily with breakfast, metformin 500 mg twice daily. Wears Dexcom continuous glucose sensor.  History of hypertension treated with amlodipine 10 mg daily, metoprolol succinate 100 mg daily with or immediately following a meal.  History of hyperlipidemia treated with rosuvastatin 5 mg three times weekly.  History of atrial fibrillation treated with apixaban 5 mg twice daily.  In 2020 she had a similar problem and was diagnosed with an E. coli UTI treated with Macrobid.  She has a history of type 2 diabetes mellitus, hyperlipidemia, hypertension and morbid obesity.  She has overactive bladder.  History of atrial fibrillation on chronic anticoagulation.  In 2021 she was found to have new onset atrial fibrillation and was referred to cardiology.  She was unaware of her arrhythmia and she was placed on Eliquis.  2D echocardiogram showed normal left ventricular function, mild left atrial enlargement, moderate pulmonary hypertension.  Has been seen in [facility] and is tried numerous medications for urge urinary incontinence.  She failed Ditropan, Myrbetriq and subsequently tried [employer].  Currently not taking any of these  History of apical systolic murmur noted by Dr. Antoine Poche in 2013.  Past medical history: Fractured left lower leg 1998, total abdominal hysterectomy without oophorectomy for menorrhagia in 1991.   She has had 2 D&C procedures in the past prior to her hysterectomy.   Left cataract extraction January 2021.  Right cataract extraction February 2021.  10/29/21 mammogram showed possibly asymmetry, mass in right breast, no findings suspicious for malignancy in left breast. 11/19/21 mammogram right breast showed no findings of malignancy in the right breast. Recommended repeat in 2024.  Colonoscopy 2009 by Dr. Loreta Ave.  Social history: She does not smoke.  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  She is now retired.  1 adult daughter who lives out of state and 1 adult son who lives here.  Husband is deceased.  She resides alone.  She has a life alert device in case she falls.  She did suffer a fall a few years ago and it was sometime before she was able to get some help.  That prompted her family to get her a life alert device.  Family history: Mother died with colon cancer at age 14.  Father died at age 41 in an automobile accident.  Stepbrother died at age 54 of a stroke.  No sisters.     Vaccine counseling: UTD on tetanus, shingles, pneumococcal 20 vaccines.  Past Medical History:  Diagnosis Date   Atrial fibrillation (HCC)    Diabetes mellitus    Esophageal spasm    Esophagitis    Hiatal hernia    Hypertension      Family History  Problem Relation Age of Onset   Arthritis Mother  Atrial fibrillation Mother 33   Fibromyalgia Mother    Osteoporosis Mother    Colon cancer Mother    Hypertension Brother    Diabetes Brother    Asthma Brother      Social History   Social History Narrative   Not on file     ROS    Objective:   Vitals: LMP 01/05/1988 (Approximate)   Physical Exam   Most recent functional status assessment:    07/03/2021    9:56 AM  In your present state of health, do you have any difficulty performing the following  activities:  Hearing? 0  Vision? 0  Difficulty concentrating or making decisions? 0  Walking or climbing stairs? 1  Dressing or bathing? 0  Doing errands, shopping? 0  Preparing Food and eating ? N  Using the Toilet? N  In the past six months, have you accidently leaked urine? N  Do you have problems with loss of bowel control? N  Managing your Medications? N  Managing your Finances? N  Housekeeping or managing your Housekeeping? N   Most recent fall risk assessment:    05/21/2022   11:33 AM  Fall Risk   Falls in the past year? 0  Number falls in past yr: 0  Injury with Fall? 0  Risk for fall due to : No Fall Risks  Follow up Falls prevention discussed    Most recent depression screenings:    05/21/2022   11:33 AM 03/18/2022   10:39 AM  PHQ 2/9 Scores  PHQ - 2 Score 0 0   Most recent cognitive screening:    07/03/2021    9:57 AM  6CIT Screen  What Year? 0 points  What month? 0 points  What time? 0 points  Count back from 20 0 points  Months in reverse 0 points  Repeat phrase 2 points  Total Score 2 points     Results:   Studies obtained and personally reviewed by me:     Labs:       Component Value Date/Time   NA 141 07/02/2021 0924   K 4.9 07/02/2021 0924   CL 102 07/02/2021 0924   CO2 28 07/02/2021 0924   GLUCOSE 206 (H) 07/02/2021 0924   BUN 10 07/02/2021 0924   CREATININE 0.74 07/02/2021 0924   CALCIUM 9.5 07/02/2021 0924   PROT 6.6 01/14/2022 0940   ALBUMIN 4.2 04/08/2015 0904   AST 8 (L) 01/14/2022 0940   ALT 10 01/14/2022 0940   ALKPHOS 130 04/08/2015 0904   BILITOT 0.5 01/14/2022 0940   GFRNONAA 73 05/27/2020 0934   GFRAA 85 05/27/2020 0934     Lab Results  Component Value Date   WBC 8.2 05/13/2022   HGB 16.1 (H) 05/13/2022   HCT 49.3 (H) 05/13/2022   MCV 92 05/13/2022   PLT 330 05/13/2022    Lab Results  Component Value Date   CHOL 151 01/14/2022   HDL 43 (L) 01/14/2022   LDLCALC 90 01/14/2022   TRIG 86 01/14/2022    CHOLHDL 3.5 01/14/2022    Lab Results  Component Value Date   HGBA1C 10.0 (H) 06/17/2022     Lab Results  Component Value Date   TSH 1.74 07/02/2021        Assessment & Plan:         Annual wellness visit done today including the all of the following: Reviewed patient's Family Medical History Reviewed and updated list of patient's medical providers Assessment of cognitive impairment  was done Assessed patient's functional ability Established a written schedule for health screening services Health Risk Assessent Completed and Reviewed  Discussed health benefits of physical activity, and encouraged her to engage in regular exercise appropriate for her age and condition.        I,Alexander Ruley,acting as a Neurosurgeon for Margaree Mackintosh, MD.,have documented all relevant documentation on the behalf of Margaree Mackintosh, MD,as directed by  Margaree Mackintosh, MD while in the presence of Margaree Mackintosh, MD.   I, Margaree Mackintosh, MD, have reviewed all documentation for this visit. The documentation on 07/16/22 for the exam, diagnosis, procedures, and orders are all accurate and complete.

## 2022-07-06 ENCOUNTER — Ambulatory Visit: Payer: PPO

## 2022-07-12 ENCOUNTER — Telehealth: Payer: Self-pay

## 2022-07-12 NOTE — Telephone Encounter (Signed)
Done

## 2022-07-12 NOTE — Telephone Encounter (Signed)
Patient seen In ED 6/18. She was given keflex 500 mg TID x 7 days. She finished her abx but is still having frequency. Looks like on culture done it was sensitive to keflex. She would like to know if she can do a UA/culture tomorrow at her lab appt here. Please advise.

## 2022-07-13 ENCOUNTER — Ambulatory Visit: Payer: PPO | Admitting: Internal Medicine

## 2022-07-13 ENCOUNTER — Encounter: Payer: Self-pay | Admitting: Internal Medicine

## 2022-07-13 ENCOUNTER — Other Ambulatory Visit: Payer: PPO

## 2022-07-13 VITALS — BP 132/80 | HR 101 | Resp 20 | Ht 61.25 in | Wt 260.5 lb

## 2022-07-13 DIAGNOSIS — E7439 Other disorders of intestinal carbohydrate absorption: Secondary | ICD-10-CM

## 2022-07-13 DIAGNOSIS — N390 Urinary tract infection, site not specified: Secondary | ICD-10-CM

## 2022-07-13 DIAGNOSIS — N3281 Overactive bladder: Secondary | ICD-10-CM

## 2022-07-13 DIAGNOSIS — E1169 Type 2 diabetes mellitus with other specified complication: Secondary | ICD-10-CM

## 2022-07-13 DIAGNOSIS — I10 Essential (primary) hypertension: Secondary | ICD-10-CM

## 2022-07-13 DIAGNOSIS — E119 Type 2 diabetes mellitus without complications: Secondary | ICD-10-CM

## 2022-07-13 DIAGNOSIS — Z1329 Encounter for screening for other suspected endocrine disorder: Secondary | ICD-10-CM

## 2022-07-13 LAB — CBC WITH DIFFERENTIAL/PLATELET
Absolute Monocytes: 660 cells/uL (ref 200–950)
Basophils Relative: 0.6 %
Eosinophils Absolute: 146 cells/uL (ref 15–500)
HCT: 49.2 % — ABNORMAL HIGH (ref 35.0–45.0)
Monocytes Relative: 6.8 %
Neutro Abs: 7411 cells/uL (ref 1500–7800)
RDW: 13.4 % (ref 11.0–15.0)
Total Lymphocyte: 14.7 %
WBC: 9.7 10*3/uL (ref 3.8–10.8)

## 2022-07-13 LAB — POCT CBG (FASTING - GLUCOSE)-MANUAL ENTRY: Glucose Fasting, POC: 165 mg/dL — AB (ref 70–99)

## 2022-07-13 MED ORDER — CIPROFLOXACIN HCL 250 MG PO TABS
250.0000 mg | ORAL_TABLET | Freq: Two times a day (BID) | ORAL | 0 refills | Status: DC
Start: 2022-07-13 — End: 2022-08-06

## 2022-07-13 NOTE — Progress Notes (Addendum)
Patient Care Team: Margaree Mackintosh, MD as PCP - General (Internal Medicine) Rollene Rotunda, MD as PCP - Cardiology (Cardiology) Clarene Duke Karma Lew, RN as [facility] Care Management Chucky May, M.D., PA  Visit Date: 07/13/22  Subjective:    Patient ID: Ebony Ashley , Female   DOB: [birth year], 77 y.o.    MRN: [id number]   77 y.o. Female presents today for persistent urinary frequency. Denies dysuria, fever, chills. Taking solifenacin (Vesicare) 5 mg for overactive bladder without relief. Tested positive for Klebsiella oxytoca on 06/22/22 and was treated with cephalexin. Glucose is 165 today. Hx of DM.  Past Medical History:  Diagnosis Date   Atrial fibrillation (HCC)    Diabetes mellitus    Esophageal spasm    Esophagitis    Hiatal hernia    Hypertension      Family History  Problem Relation Age of Onset   Arthritis Mother    Atrial fibrillation Mother 72   Fibromyalgia Mother    Osteoporosis Mother    Colon cancer Mother    Hypertension Brother    Diabetes Brother    Asthma Brother     Social Hx: Retired and lives alone. Daughter resides out of state and son resides near [facility]     Review of Systems  Constitutional:  Negative for chills, fever and malaise/fatigue.  HENT:  Negative for congestion.   Eyes:  Negative for blurred vision.  Respiratory:  Negative for cough and shortness of breath.   Cardiovascular:  Negative for chest pain, palpitations and leg swelling.  Gastrointestinal:  Negative for vomiting.  Genitourinary:  Positive for frequency. Negative for dysuria.  Musculoskeletal:  Negative for back pain.  Skin:  Negative for rash.  Neurological:  Negative for loss of consciousness and headaches.        Objective:   Vitals: BP 132/80   Pulse (!) 101   Resp 20   Ht 5' 1.25" (1.556 m)   Wt 260 lb 8 oz (118.2 kg)   LMP 01/05/1988 (Approximate)   SpO2 94%   BMI 48.82 kg/m    Physical Exam Vitals and nursing note  reviewed.  Constitutional:      General: She is not in acute distress.    Appearance: Normal appearance. She is not toxic-appearing.  HENT:     Head: Normocephalic and atraumatic.  Pulmonary:     Effort: Pulmonary effort is normal.  Skin:    General: Skin is warm and dry.  Neurological:     Mental Status: She is alert and oriented to person, place, and time. Mental status is at baseline.  Psychiatric:        Mood and Affect: Mood normal.        Behavior: Behavior normal.        Thought Content: Thought content normal.        Judgment: Judgment normal.       Results:   Studies obtained and personally reviewed by me:   Labs:       Component Value Date/Time   NA 141 07/02/2021 0924   K 4.9 07/02/2021 0924   CL 102 07/02/2021 0924   CO2 28 07/02/2021 0924   GLUCOSE 206 (H) 07/02/2021 0924   BUN 10 07/02/2021 0924   CREATININE 0.74 07/02/2021 0924   CALCIUM 9.5 07/02/2021 0924   PROT 6.6 01/14/2022 0940   ALBUMIN 4.2 04/08/2015 0904   AST 8 (L) 01/14/2022 0940   ALT 10 01/14/2022 0940  ALKPHOS 130 04/08/2015 0904   BILITOT 0.5 01/14/2022 0940   GFRNONAA 73 05/27/2020 0934   GFRAA 85 05/27/2020 0934     Lab Results  Component Value Date   WBC 8.2 05/13/2022   HGB 16.1 (H) 05/13/2022   HCT 49.3 (H) 05/13/2022   MCV 92 05/13/2022   PLT 330 05/13/2022    Lab Results  Component Value Date   CHOL 151 01/14/2022   HDL 43 (L) 01/14/2022   LDLCALC 90 01/14/2022   TRIG 86 01/14/2022   CHOLHDL 3.5 01/14/2022    Lab Results  Component Value Date   HGBA1C 10.0 (H) 06/17/2022     Lab Results  Component Value Date   TSH 1.74 07/02/2021      Assessment & Plan:   Persistent UTI, Overactive bladder: continue Vesicare 5 mg daily.  Start Ciprofloxacin 250 mg twice daily.  Return on 7/12 for health maintenance exam and follow up of urinary symptoms    I,Alexander Ruley,acting as a scribe for Margaree Mackintosh, MD.,have documented all relevant documentation on  the behalf of Margaree Mackintosh, MD,as directed by  Margaree Mackintosh, MD while in the presence of Margaree Mackintosh, MD.   I, Margaree Mackintosh, MD, have reviewed all documentation for this visit. The documentation on 07/31/22 for the exam, diagnosis, procedures, and orders are all accurate and complete.  IMargaree Mackintosh, MD, have reviewed all documentation for this visit. The documentation on 07/31/22 for the exam, diagnosis, procedures, and orders are all accurate and complete.

## 2022-07-13 NOTE — Patient Instructions (Signed)
Urine specimen not obtained today.  Patient will bring urine specimen from home this coming Friday when she returns for Medicare wellness and annual health maintenance exam.  We have changed her antibiotic to Cipro 250 mg twice daily for 7 days as she is still having urinary frequency.

## 2022-07-14 LAB — CBC WITH DIFFERENTIAL/PLATELET
Basophils Absolute: 58 cells/uL (ref 0–200)
Eosinophils Relative: 1.5 %
Hemoglobin: 16.4 g/dL — ABNORMAL HIGH (ref 11.7–15.5)
Lymphs Abs: 1426 cells/uL (ref 850–3900)
MCH: 30.4 pg (ref 27.0–33.0)
MCHC: 33.3 g/dL (ref 32.0–36.0)
MCV: 91.1 fL (ref 80.0–100.0)
MPV: 12.8 fL — ABNORMAL HIGH (ref 7.5–12.5)
Neutrophils Relative %: 76.4 %
Platelets: 321 10*3/uL (ref 140–400)
RBC: 5.4 10*6/uL — ABNORMAL HIGH (ref 3.80–5.10)

## 2022-07-14 LAB — COMPLETE METABOLIC PANEL WITH GFR
AG Ratio: 1.5 (calc) (ref 1.0–2.5)
ALT: 14 U/L (ref 6–29)
AST: 16 U/L (ref 10–35)
Albumin: 4.2 g/dL (ref 3.6–5.1)
Alkaline phosphatase (APISO): 116 U/L (ref 37–153)
BUN: 19 mg/dL (ref 7–25)
CO2: 28 mmol/L (ref 20–32)
Calcium: 10.1 mg/dL (ref 8.6–10.4)
Chloride: 101 mmol/L (ref 98–110)
Creat: 0.92 mg/dL (ref 0.60–1.00)
Globulin: 2.8 g/dL (calc) (ref 1.9–3.7)
Glucose, Bld: 170 mg/dL — ABNORMAL HIGH (ref 65–99)
Potassium: 4.9 mmol/L (ref 3.5–5.3)
Sodium: 140 mmol/L (ref 135–146)
Total Bilirubin: 0.4 mg/dL (ref 0.2–1.2)
Total Protein: 7 g/dL (ref 6.1–8.1)
eGFR: 64 mL/min/{1.73_m2} (ref >=60)

## 2022-07-14 LAB — LIPID PANEL
Cholesterol: 166 mg/dL (ref <200)
HDL: 44 mg/dL — ABNORMAL LOW (ref >=50)
LDL Cholesterol (Calc): 100 mg/dL (calc) — ABNORMAL HIGH
Non-HDL Cholesterol (Calc): 122 mg/dL (calc) (ref <130)
Total CHOL/HDL Ratio: 3.8 (calc) (ref <5.0)
Triglycerides: 122 mg/dL (ref <150)

## 2022-07-14 LAB — TSH: TSH: 1.75 mIU/L (ref 0.40–4.50)

## 2022-07-14 LAB — HEMOGLOBIN A1C
Hgb A1c MFr Bld: 8.9 % of total Hgb — ABNORMAL HIGH (ref <5.7)
Mean Plasma Glucose: 209 mg/dL
eAG (mmol/L): 11.6 mmol/L

## 2022-07-16 ENCOUNTER — Encounter: Payer: Self-pay | Admitting: Internal Medicine

## 2022-07-16 ENCOUNTER — Ambulatory Visit (INDEPENDENT_AMBULATORY_CARE_PROVIDER_SITE_OTHER): Payer: PPO | Admitting: Internal Medicine

## 2022-07-16 ENCOUNTER — Ambulatory Visit (INDEPENDENT_AMBULATORY_CARE_PROVIDER_SITE_OTHER): Payer: PPO

## 2022-07-16 VITALS — Wt 280.5 lb

## 2022-07-16 VITALS — HR 84 | Resp 18 | Ht 61.25 in | Wt 260.5 lb

## 2022-07-16 DIAGNOSIS — N390 Urinary tract infection, site not specified: Secondary | ICD-10-CM | POA: Diagnosis not present

## 2022-07-16 DIAGNOSIS — Z0000 Encounter for general adult medical examination without abnormal findings: Secondary | ICD-10-CM

## 2022-07-16 DIAGNOSIS — B9620 Unspecified Escherichia coli [E. coli] as the cause of diseases classified elsewhere: Secondary | ICD-10-CM

## 2022-07-16 MED ORDER — CEFTRIAXONE SODIUM 1 G IJ SOLR
1.0000 g | Freq: Once | INTRAMUSCULAR | Status: AC
Start: 2022-07-16 — End: 2022-07-16
  Administered 2022-07-16: 1 g via INTRAMUSCULAR

## 2022-07-16 NOTE — Progress Notes (Addendum)
Patient Care Team: Margaree Mackintosh, MD as PCP - General (Internal Medicine) Rollene Rotunda, MD as PCP - Cardiology (Cardiology) Clarene Duke Karma Lew, RN as [facility] Care Management Chucky May, M.D., PA  Visit Date: 07/16/22  Subjective:    Patient ID: Ebony Ashley , Female   DOB: [birth year], 77 y.o.    MRN: [id number]   Ebony Ashley is a 77 y.o. Female who presents today for follow-up and repeat urinalysis. She had a separate MWV today. She brought in a urine specimen that was a small amount of urine. She continues to complain of urinary symptoms. She has not completed her Cipro course at this time. She was instructed to complete the antibiotic course and follow-up with me next Tuesday. We will obtain a urine specimen at that time as well.  History of Type 2 Diabetes mellitus treated with metformin 850 mg daily, Januvia 100 mg daily, glipizide 10 mg daily with breakfast. HGBA1c at 10.0% on 06/17/22, down from 11.2% on 05/21/22.    Chronic anticoagulation- high risk for CNS bleed if she falls.   History of hypertension treated with metoprolol and ramipril.  Hyperlipidemia treated with low dose Crestor.   Chronic A-fib treated with Eliquis. In 2021 she was found to have new onset atrial fibrillation and was referred to cardiology.  2D echocardiogram showed normal left ventricular function, mild left atrial enlargement and moderate pulmonary hypertension. History of apical systolic murmur noted by Dr. Antoine Poche in 2013.   Has tried numerous medications for urge urinary incontinence.  Failed Ditropan and Myrbetriq and tried on Vesicare.     Past Medical History:  Diagnosis Date   Atrial fibrillation (HCC)    Diabetes mellitus    Esophageal spasm    Esophagitis    Hiatal hernia    Hypertension      Family History  Problem Relation Age of Onset   Arthritis Mother    Atrial fibrillation Mother 26   Fibromyalgia Mother    Osteoporosis Mother     Colon cancer Mother    Hypertension Brother    Diabetes Brother    Asthma Brother     Social History   Social History Narrative   Not on file    Review of Systems  Constitutional:  Negative for chills and fever.  Respiratory:  Negative for shortness of breath.   Cardiovascular:  Negative for chest pain and palpitations.  Genitourinary:  Positive for dysuria and frequency.        Objective:   Vitals: Pulse 84   Resp 18   Ht 5' 1.25" (1.556 m)   Wt 260 lb 8 oz (118.2 kg)   LMP 01/05/1988 (Approximate)   SpO2 96%   BMI 48.82 kg/m    Physical Exam    Results:   Studies obtained and personally reviewed by me:  Imaging, colonoscopy, mammogram, bone density scan, echocardiogram, heart cath, stress test, CT calcium score, etc.   Labs:       Component Value Date/Time   NA 140 07/13/2022 0938   K 4.9 07/13/2022 0938   CL 101 07/13/2022 0938   CO2 28 07/13/2022 0938   GLUCOSE 170 (H) 07/13/2022 0938   BUN 19 07/13/2022 0938   CREATININE 0.92 07/13/2022 0938   CALCIUM 10.1 07/13/2022 0938   PROT 7.0 07/13/2022 0938   ALBUMIN 4.2 04/08/2015 0904   AST 16 07/13/2022 0938   ALT 14 07/13/2022 0938   ALKPHOS 130 04/08/2015 0904  BILITOT 0.4 07/13/2022 0938   GFRNONAA 73 05/27/2020 0934   GFRAA 85 05/27/2020 0934     Lab Results  Component Value Date   WBC 9.7 07/13/2022   HGB 16.4 (H) 07/13/2022   HCT 49.2 (H) 07/13/2022   MCV 91.1 07/13/2022   PLT 321 07/13/2022    Lab Results  Component Value Date   CHOL 166 07/13/2022   HDL 44 (L) 07/13/2022   LDLCALC 100 (H) 07/13/2022   TRIG 122 07/13/2022   CHOLHDL 3.8 07/13/2022    Lab Results  Component Value Date   HGBA1C 8.9 (H) 07/13/2022     Lab Results  Component Value Date   TSH 1.75 07/13/2022       Assessment & Plan:   UTI - She has not completed her Cipro course at this time. She was instructed to complete the antibiotic course as prescribed and follow-up with me next Tuesday. We will  obtain an in-house urine specimen at that time. In addition we will give her a prescription for 1 gram Ceftriaxone.   I,Mathew Stumpf,acting as a Neurosurgeon for Margaree Mackintosh, MD.,have documented all relevant documentation on the behalf of Margaree Mackintosh, MD,as directed by  Margaree Mackintosh, MD while in the presence of Margaree Mackintosh, MD.  I, Margaree Mackintosh, MD, have reviewed all documentation for this visit. The documentation on 07/20/22 for the exam, diagnosis, procedures, and orders are all accurate and complete.   IMargaree Mackintosh, MD, have reviewed all documentation for this visit. The documentation on 07/22/22 for the exam, diagnosis, procedures, and orders are all accurate and complete.

## 2022-07-16 NOTE — Progress Notes (Signed)
Subjective:   Ebony Ashley is a 77 y.o. female who presents for Medicare Annual (Subsequent) preventive examination.  Visit Complete: In person  Patient Medicare AWV questionnaire was completed by the patient on 07/16/22; I have confirmed that all information answered by patient is correct and no changes since this date.      Objective:    There were no vitals filed for this visit. There is no height or weight on file to calculate BMI.     07/16/2022    3:36 PM 07/03/2021    9:55 AM 06/27/2019    3:47 PM 06/27/2019   12:39 PM  Advanced Directives  Does Patient Have a Medical Advance Directive? Yes No  Yes  Type of Estate agent of [place];Living will   Healthcare Power of [place];Living will  Does patient want to make changes to medical advance directive? No - Patient declined  No - Patient declined No - Guardian declined  Copy of Healthcare Power of Attorney in Chart? No - copy requested   Yes - validated most recent copy scanned in chart (See row information)  Would patient like information on creating a medical advance directive?  Yes (MAU/Ambulatory/Procedural Areas - Information given)      Current Medications (verified) Outpatient Encounter Medications as of 07/16/2022  Medication Sig   amLODipine (NORVASC) 10 MG tablet TAKE 1 TABLET BY MOUTH EVERY DAY   apixaban (ELIQUIS) 5 MG TABS tablet Take 1 tablet (5 mg total) by mouth 2 (two) times daily.   ciprofloxacin (CIPRO) 250 MG tablet Take 1 tablet (250 mg total) by mouth 2 (two) times daily.   Continuous Glucose Receiver (DEXCOM G7 RECEIVER) DEVI Use as directed   Continuous Glucose Sensor (DEXCOM G7 SENSOR) MISC Apply 1 sensor every 14 days   glipiZIDE (GLUCOTROL XL) 10 MG 24 hr tablet TAKE 1 TABLET (10 MG TOTAL) BY MOUTH DAILY WITH BREAKFAST.   metFORMIN (GLUCOPHAGE) 500 MG tablet Take 1 tablet (500 mg total) by mouth 2 (two) times daily.   metoprolol succinate (TOPROL-XL) 100 MG 24 hr tablet  TAKE 1 TABLET WITH OR IMMEDIATELY FOLLOWING A MEAL   nitroGLYCERIN (NITROSTAT) 0.4 MG SL tablet Place 0.4 mg under the tongue every 5 (five) minutes as needed.   ramipril (ALTACE) 5 MG capsule TAKE 1 CAPSULE BY MOUTH EVERY DAY   rosuvastatin (CRESTOR) 5 MG tablet TAKE 1 TABLET (5 MG TOTAL) BY MOUTH 3 (THREE) TIMES A WEEK. MONDAYS, WEDNESDAYS AND FRIDAYS.   sitaGLIPtin (JANUVIA) 100 MG tablet Take 1 tablet (100 mg total) by mouth daily.   solifenacin (VESICARE) 10 MG tablet TAKE 1/2 TABLET BY MOUTH DAILY   traMADol (ULTRAM) 50 MG tablet TAKE 1 TABLET (50 MG TOTAL) BY MOUTH EVERY 8 (EIGHT) HOURS AS NEEDED. *MAX 7 DAYS   triamcinolone (KENALOG) 0.1 % APPLY TO AFFECTED AREA TWICE A DAY   No facility-administered encounter medications on file as of 07/16/2022.    Allergies (verified) Adhesive [tape] and Polysporin [bacitracin-polymyxin b]   History: Past Medical History:  Diagnosis Date   Atrial fibrillation (HCC)    Diabetes mellitus    Esophageal spasm    Esophagitis    Hiatal hernia    Hypertension    Past Surgical History:  Procedure Laterality Date   ABDOMINAL HYSTERECTOMY  1990   DUB, ovaries remain  Dr. Laurena Bering, off ERT 4/11   TONSILLECTOMY AND ADENOIDECTOMY  1951   Family History  Problem Relation Age of Onset   Arthritis Mother  Atrial fibrillation Mother 49   Fibromyalgia Mother    Osteoporosis Mother    Colon cancer Mother    Hypertension Brother    Diabetes Brother    Asthma Brother    Social History   Socioeconomic History   Marital status: Widowed    Spouse name: Not on file   Number of children: 2   Years of education: Not on file   Highest education level: Not on file  Occupational History   Occupation: Lab at [employer]  Tobacco Use   Smoking status: Never   Smokeless tobacco: Never  Substance and Sexual Activity   Alcohol use: Not Currently   Drug use: No   Sexual activity: Not Currently    Partners: Male    Birth control/protection: Surgical     Comment: hysterectomy  Other Topics Concern   Not on file  Social History Narrative   Not on file   Social Determinants of Health   Financial Resource Strain: Not on file  Food Insecurity: No Food Insecurity (06/27/2019)   Hunger Vital Sign    Worried About Running Out of Food in the Last Year: Never true    Ran Out of Food in the Last Year: Never true  Transportation Needs: No Transportation Needs (06/14/2022)   PRAPARE - Administrator, Civil Service (Medical): No    Lack of Transportation (Non-Medical): No  Physical Activity: Not on file  Stress: Not on file  Social Connections: Unknown (05/27/2022)   [facility]   Social Network    Social Network: Not on file    Tobacco Counseling Counseling given: Not Answered   Clinical Intake:                        Activities of Daily Living    07/16/2022    4:00 PM  In your present state of health, do you have any difficulty performing the following activities:  Hearing? 0  Vision? 0  Difficulty concentrating or making decisions? 1  Walking or climbing stairs? 1  Dressing or bathing? 0  Doing errands, shopping? 0  Preparing Food and eating ? N  Using the Toilet? N  In the past six months, have you accidently leaked urine? N  Do you have problems with loss of bowel control? N  Managing your Medications? N  Managing your Finances? N  Housekeeping or managing your Housekeeping? N    Patient Care Team: Margaree Mackintosh, MD as PCP - General (Internal Medicine) Rollene Rotunda, MD as PCP - Cardiology (Cardiology) Clarene Duke Karma Lew, RN as [facility] Care Management Chucky May, M.D., PA  Indicate any recent Medical Services you may have received from other than [facility] providers in the past year (date may be approximate).     Assessment:   This is a routine wellness examination for Ebony Ashley.  Hearing/Vision screen Hearing Screening - Comments:: Patient  denies issue  Dietary issues and exercise activities discussed:     Goals Addressed   None   Depression Screen    07/16/2022    3:38 PM 05/21/2022   11:33 AM 03/18/2022   10:39 AM 01/18/2022   10:12 AM 07/03/2021    9:56 AM 06/06/2020    3:10 PM 06/27/2019   12:40 PM  PHQ 2/9 Scores  PHQ - 2 Score 0 0 0 0 0 0 0    Fall Risk    07/16/2022    3:37  PM 05/21/2022   11:33 AM 03/18/2022   10:39 AM 01/18/2022   10:12 AM 07/03/2021    9:55 AM  Fall Risk   Falls in the past year? 0 0 0 0 0  Number falls in past yr: 0 0 0 0 0  Injury with Fall? 0 0 0 0 0  Risk for fall due to : Impaired balance/gait No Fall Risks No Fall Risks No Fall Risks Impaired balance/gait;Impaired mobility  Follow up Falls evaluation completed;Education provided Falls prevention discussed Falls prevention discussed Falls prevention discussed Falls evaluation completed    MEDICARE RISK AT HOME:   TIMED UP AND GO:  Was the test performed?  No    Cognitive Function:        07/16/2022    3:38 PM 07/03/2021    9:57 AM  6CIT Screen  What Year? 0 points 0 points  What month? 0 points 0 points  What time? 0 points 0 points  Count back from 20 4 points 0 points  Months in reverse 4 points 0 points  Repeat phrase 10 points 2 points  Total Score 18 points 2 points    Immunizations Immunization History  Administered Date(s) Administered   Influenza Whole 08/04/2009   Influenza, High Dose Seasonal PF 10/03/2014   Influenza,inj,Quad PF,6+ Mos 09/19/2012, 09/13/2017, 09/21/2018, 10/09/2019, 10/16/2020   Influenza-Unspecified 10/17/2013, 10/03/2014, 09/19/2015, 10/20/2016   PFIZER Comirnaty(Gray Top)Covid-19 Tri-Sucrose Vaccine 06/30/2020   PFIZER(Purple Top)SARS-COV-2 Vaccination 01/23/2019, 02/12/2019, 11/12/2019   PNEUMOCOCCAL CONJUGATE-20 01/18/2022   Pfizer Covid-19 Vaccine Bivalent Booster 51yrs & up 10/21/2020   Pneumococcal Conjugate-13 10/03/2014   Pneumococcal Polysaccharide-23 08/04/2009, 10/27/2012    Tdap 01/04/2005, 02/04/2015   Zoster Recombinant(Shingrix) 08/11/2016, 12/22/2016   Zoster, Live 10/04/2012    TDAP status: Up to date  Flu Vaccine status: Up to date  Pneumococcal vaccine status: Up to date  Covid-19 vaccine status: Information provided on how to obtain vaccines.   Qualifies for Shingles Vaccine? Yes   Zostavax completed Yes   Shingrix Completed?: Yes  Screening Tests Health Maintenance  Topic Date Due   COVID-19 Vaccine (6 - 2023-24 season) 09/18/2022 (Originally 09/04/2021)   INFLUENZA VACCINE  08/05/2022   MAMMOGRAM  10/29/2022   HEMOGLOBIN A1C  01/13/2023   OPHTHALMOLOGY EXAM  01/14/2023   Diabetic kidney evaluation - Urine ACR  01/15/2023   FOOT EXAM  06/21/2023   Diabetic kidney evaluation - eGFR measurement  07/13/2023   Medicare Annual Wellness (AWV)  07/16/2023   DTaP/Tdap/Td (3 - Td or Tdap) 02/03/2025   Pneumonia Vaccine 22+ Years old  Completed   DEXA SCAN  Completed   Hepatitis C Screening  Completed   Zoster Vaccines- Shingrix  Completed   HPV VACCINES  Aged Out   Colonoscopy  Discontinued    Health Maintenance  There are no preventive care reminders to display for this patient.   Colorectal cancer screening: No longer required.   Mammogram status: Completed [id number]. Repeat every year     Vision Screening: Recommended annual ophthalmology exams for early detection of glaucoma and other disorders of the eye. Is the patient up to date with their annual eye exam?  Yes  Who is the provider or what is the name of the office in which the patient attends annual eye exams?  If pt is not established with a provider, would they like to be referred to a provider to establish care? No .   Dental Screening: Recommended annual dental exams for proper oral hygiene  Community Resource Referral / Chronic Care Management: CRR required this visit?  No   CCM required this visit?  No     Plan:     I have personally reviewed and noted  the following in the patient's chart:   Medical and social history Use of alcohol, tobacco or illicit drugs  Current medications and supplements including opioid prescriptions. Patient is not currently taking opioid prescriptions. Functional ability and status Nutritional status Physical activity Advanced directives List of other physicians Hospitalizations, surgeries, and ER visits in previous 12 months Vitals Screenings to include cognitive, depression, and falls Referrals and appointments  In addition, I have reviewed and discussed with patient certain preventive protocols, quality metrics, and best practice recommendations. A written personalized care plan for preventive services as well as general preventive health recommendations were provided to patient.     Jama Flavors, CMA   07/16/2022    I, Margaree Mackintosh, MD, have reviewed all documentation for this visit. The documentation on 07/22/22 for the exam, diagnosis, procedures, and orders are all accurate and complete.

## 2022-07-20 ENCOUNTER — Encounter: Payer: Self-pay | Admitting: Internal Medicine

## 2022-07-20 ENCOUNTER — Ambulatory Visit (INDEPENDENT_AMBULATORY_CARE_PROVIDER_SITE_OTHER): Payer: PPO | Admitting: Internal Medicine

## 2022-07-20 VITALS — BP 118/84 | HR 96 | Wt 280.5 lb

## 2022-07-20 DIAGNOSIS — B9620 Unspecified Escherichia coli [E. coli] as the cause of diseases classified elsewhere: Secondary | ICD-10-CM | POA: Diagnosis not present

## 2022-07-20 DIAGNOSIS — N390 Urinary tract infection, site not specified: Secondary | ICD-10-CM

## 2022-07-20 LAB — POCT URINALYSIS DIPSTICK
Bilirubin, UA: NEGATIVE
Glucose, UA: NEGATIVE
Ketones, UA: NEGATIVE
Nitrite, UA: NEGATIVE
Protein, UA: NEGATIVE
Spec Grav, UA: 1.015 (ref 1.010–1.025)
Urobilinogen, UA: 0.2 E.U./dL
pH, UA: 5 (ref 5.0–8.0)

## 2022-07-20 NOTE — Progress Notes (Signed)
Patient Care Team: Margaree Mackintosh, MD as PCP - General (Internal Medicine) Rollene Rotunda, MD as PCP - Cardiology (Cardiology) Clarene Duke Karma Lew, RN as [facility] Care Management Chucky May, M.D., PA  Visit Date: 07/20/22  Subjective:    Patient ID: Ebony Ashley , Female   DOB: May 17, 1945, 77 y.o.    MRN: [id number]   77 y.o. Female presents today for 1 week follow-up regarding urinary frequency. She is feeling better. Denies dysuria, fever, chills. Urinary frequency has improved.  Past Medical History:  Diagnosis Date   Atrial fibrillation (HCC)    Diabetes mellitus    Esophageal spasm    Esophagitis    Hiatal hernia    Hypertension      Family History  Problem Relation Age of Onset   Arthritis Mother    Atrial fibrillation Mother 93   Fibromyalgia Mother    Osteoporosis Mother    Colon cancer Mother    Hypertension Brother    Diabetes Brother    Asthma Brother     Social History   Social History Narrative   Not on file      Review of Systems  Constitutional:  Negative for chills, fever and malaise/fatigue.  HENT:  Negative for congestion.   Eyes:  Negative for blurred vision.  Respiratory:  Negative for cough and shortness of breath.   Cardiovascular:  Negative for chest pain, palpitations and leg swelling.  Gastrointestinal:  Negative for vomiting.  Genitourinary:  Positive for frequency (Mild).  Musculoskeletal:  Negative for back pain.  Skin:  Negative for rash.  Neurological:  Negative for loss of consciousness and headaches.        Objective:   Vitals: BP 118/84   Pulse 96   Wt 280 lb 8 oz (127.2 kg)   LMP 01/05/1988 (Approximate)   SpO2 96%   BMI 52.57 kg/m    Physical Exam Vitals and nursing note reviewed.  Constitutional:      General: She is not in acute distress.    Appearance: Normal appearance. She is not toxic-appearing.  HENT:     Head: Normocephalic and atraumatic.  Pulmonary:     Effort:  Pulmonary effort is normal.  Skin:    General: Skin is warm and dry.  Neurological:     Mental Status: She is alert and oriented to person, place, and time. Mental status is at baseline.  Psychiatric:        Mood and Affect: Mood normal.        Behavior: Behavior normal.        Thought Content: Thought content normal.        Judgment: Judgment normal.       Results:   Studies obtained and personally reviewed by me:   Labs:       Component Value Date/Time   NA 140 07/13/2022 0938   K 4.9 07/13/2022 0938   CL 101 07/13/2022 0938   CO2 28 07/13/2022 0938   GLUCOSE 170 (H) 07/13/2022 0938   BUN 19 07/13/2022 0938   CREATININE 0.92 07/13/2022 0938   CALCIUM 10.1 07/13/2022 0938   PROT 7.0 07/13/2022 0938   ALBUMIN 4.2 04/08/2015 0904   AST 16 07/13/2022 0938   ALT 14 07/13/2022 0938   ALKPHOS 130 04/08/2015 0904   BILITOT 0.4 07/13/2022 0938   GFRNONAA 73 05/27/2020 0934   GFRAA 85 05/27/2020 0934     Lab Results  Component Value Date   WBC 9.7  07/13/2022   HGB 16.4 (H) 07/13/2022   HCT 49.2 (H) 07/13/2022   MCV 91.1 07/13/2022   PLT 321 07/13/2022    Lab Results  Component Value Date   CHOL 166 07/13/2022   HDL 44 (L) 07/13/2022   LDLCALC 100 (H) 07/13/2022   TRIG 122 07/13/2022   CHOLHDL 3.8 07/13/2022    Lab Results  Component Value Date   HGBA1C 8.9 (H) 07/13/2022     Lab Results  Component Value Date   TSH 1.75 07/13/2022     Assessment & Plan:   UTI: urinary frequency has improved. Urinalysis showed trace leukocytes. No further intervention at this time. Will continue to monitor.    I,Alexander Ruley,acting as a Neurosurgeon for Margaree Mackintosh, MD.,have documented all relevant documentation on the behalf of Margaree Mackintosh, MD,as directed by  Margaree Mackintosh, MD while in the presence of Margaree Mackintosh, MD.   I, Margaree Mackintosh, MD, have reviewed all documentation for this visit. The documentation on 07/22/22 for the exam, diagnosis, procedures, and  orders are all accurate and complete.

## 2022-07-22 NOTE — Addendum Note (Signed)
Addended by: Margaree Mackintosh on: 07/22/2022 05:17 PM   Modules accepted: Level of Service

## 2022-07-27 ENCOUNTER — Ambulatory Visit: Payer: Self-pay

## 2022-07-27 NOTE — Patient Instructions (Signed)
Visit Information  Thank you for taking time to visit with me today. Please don't hesitate to contact me if I can be of assistance to you.   Following are the goals we discussed today:   Goals Addressed               This Visit's Progress     Patient Stated     To pay more attention to dietary intake (pt-stated)        Care Coordination Interventions: Evaluation of current treatment plan related to type 2 diabetes mellitus and patient's adherence to plan as established by provider Determined patient completed her follow up visit with Dr. Roanna Raider, Endocrinologist for evaluation of her diabetes  Review of patient status, including review of consultant's reports, relevant laboratory and other test results, and medications completed Educated patient about GLP-1 therapy, including indication, dosing and frequency Determined patient was not aware this would be an injectable but she is willing to learn how to self-inject  Reviewed with patient her next scheduled f/u with Dr. Roanna Raider scheduled for 08/20/22 @4 :20 PM is for injection training and patient verbalizes understanding  Assessed patient's understanding of A1c goal:  <7.5 % Lab Results  Component Value Date   HGBA1C 11.2 (H) 05/21/2022       Other     COMPLETED: RN Care Coordination Activities: further follow up needed        Care Coordination Interventions: Confirmed patient completed her f/u with Endocrinology as scheduled (see other goal) Assessed for SDOH barriers, none identified at this time         Our next appointment is by telephone on 08/24/22 at 11:30 AM  Please call the care guide team at [phone] if you need to cancel or reschedule your appointment.   If you are experiencing a Mental Health or Behavioral Health Crisis or need someone to talk to, please call 1-800-273-TALK (toll free, 24 hour hotline)  The patient verbalized understanding of instructions, educational materials, and care plan provided today and  DECLINED offer to receive copy of patient instructions, educational materials, and care plan.   Delsa Sale, RN, BSN, CCM Care Management Coordinator [facility] Care Management Direct Phone: [phone]

## 2022-07-27 NOTE — Patient Outreach (Addendum)
  Care Coordination   Follow Up Visit Note   07/27/2022 Name: Ebony Ashley MRN: [id number] DOB: August 21, 1945  Ebony Ashley is a 77 y.o. year old female who sees Baxley, Luanna Cole, MD for primary care. I spoke with  Ebony Ashley by phone today.  What matters to the patients health and wellness today?  Patient will learn how to self inject diabetes medicine as directed by her Endocrinologist.     Goals Addressed               This Visit's Progress     Patient Stated     To pay more attention to dietary intake (pt-stated)        Care Coordination Interventions: Evaluation of current treatment plan related to type 2 diabetes mellitus and patient's adherence to plan as established by provider Determined patient completed her follow up visit with Ebony Ashley, Endocrinologist for evaluation of her diabetes  Review of patient status, including review of consultant's reports, relevant laboratory and other test results, and medications completed Educated patient about GLP-1 therapy, including indication, dosing and frequency Determined patient was not aware this would be an injectable but she is willing to learn how to self-inject  Reviewed with patient her next scheduled f/u with Ebony Ashley scheduled for 08/20/22 @4 :20 PM is for injection training and patient verbalizes understanding  Assessed patient's understanding of A1c goal:  <7.5 % Lab Results  Component Value Date   HGBA1C 11.2 (H) 05/21/2022        Other     COMPLETED: RN Care Coordination Activities: further follow up needed        Care Coordination Interventions: Confirmed patient completed her f/u with Endocrinology as scheduled (see other goal) Assessed for SDOH barriers, none identified at this time     Interventions Today    Flowsheet Row Most Recent Value  Chronic Disease   Chronic disease during today's visit Diabetes  General Interventions   General Interventions Discussed/Reviewed General Interventions  Reviewed, General Interventions Discussed, Doctor Visits  Doctor Visits Discussed/Reviewed Doctor Visits Discussed, Doctor Visits Reviewed, Specialist  Education Interventions   Education Provided Provided Education  Provided Verbal Education On When to see the doctor, Medication, Labs  Labs Reviewed Hgb A1c  Pharmacy Interventions   Pharmacy Dicussed/Reviewed Pharmacy Topics Discussed, Pharmacy Topics Reviewed, Medications and their functions          SDOH assessments and interventions completed:  No     Care Coordination Interventions:  Yes, provided   Follow up plan: Follow up call scheduled for 08/24/22 @11 :30 AM    Encounter Outcome:  Pt. Visit Completed

## 2022-07-29 ENCOUNTER — Other Ambulatory Visit: Payer: Self-pay

## 2022-07-29 ENCOUNTER — Encounter (HOSPITAL_COMMUNITY): Payer: Self-pay | Admitting: Emergency Medicine

## 2022-07-29 ENCOUNTER — Emergency Department (HOSPITAL_COMMUNITY)
Admission: EM | Admit: 2022-07-29 | Discharge: 2022-07-29 | Disposition: A | Discharge status: Home / Self Care | Payer: PPO | Attending: Emergency Medicine | Admitting: Emergency Medicine

## 2022-07-29 ENCOUNTER — Emergency Department (HOSPITAL_COMMUNITY): Payer: PPO

## 2022-07-29 DIAGNOSIS — R531 Weakness: Secondary | ICD-10-CM | POA: Diagnosis present

## 2022-07-29 DIAGNOSIS — Z7901 Long term (current) use of anticoagulants: Secondary | ICD-10-CM | POA: Insufficient documentation

## 2022-07-29 DIAGNOSIS — Z20822 Contact with and (suspected) exposure to covid-19: Secondary | ICD-10-CM | POA: Insufficient documentation

## 2022-07-29 DIAGNOSIS — M25561 Pain in right knee: Secondary | ICD-10-CM | POA: Diagnosis not present

## 2022-07-29 DIAGNOSIS — E119 Type 2 diabetes mellitus without complications: Secondary | ICD-10-CM | POA: Insufficient documentation

## 2022-07-29 DIAGNOSIS — B372 Candidiasis of skin and nail: Secondary | ICD-10-CM | POA: Diagnosis not present

## 2022-07-29 DIAGNOSIS — N3000 Acute cystitis without hematuria: Secondary | ICD-10-CM

## 2022-07-29 DIAGNOSIS — Z79899 Other long term (current) drug therapy: Secondary | ICD-10-CM | POA: Insufficient documentation

## 2022-07-29 LAB — URINALYSIS, W/ REFLEX TO CULTURE (INFECTION SUSPECTED)
Bilirubin Urine: NEGATIVE
Glucose, UA: NEGATIVE mg/dL
Ketones, ur: 5 mg/dL — AB
Nitrite: NEGATIVE
Protein, ur: 30 mg/dL — AB
Specific Gravity, Urine: 1.006 (ref 1.005–1.030)
pH: 7 (ref 5.0–8.0)

## 2022-07-29 LAB — RESP PANEL BY RT-PCR (RSV, FLU A&B, COVID)  RVPGX2
Influenza A by PCR: NEGATIVE
Influenza B by PCR: NEGATIVE
Resp Syncytial Virus by PCR: NEGATIVE
SARS Coronavirus 2 by RT PCR: NEGATIVE

## 2022-07-29 LAB — COMPREHENSIVE METABOLIC PANEL
ALT: 17 U/L (ref 0–44)
AST: 23 U/L (ref 15–41)
Albumin: 3.9 g/dL (ref 3.5–5.0)
Alkaline Phosphatase: 101 U/L (ref 38–126)
Anion gap: 13 (ref 5–15)
BUN: 12 mg/dL (ref 8–23)
CO2: 24 mmol/L (ref 22–32)
Calcium: 9 mg/dL (ref 8.9–10.3)
Chloride: 100 mmol/L (ref 98–111)
Creatinine, Ser: 0.78 mg/dL (ref 0.44–1.00)
GFR, Estimated: >60 mL/min (ref >60)
Glucose, Bld: 199 mg/dL — ABNORMAL HIGH (ref 70–99)
Potassium: 4.2 mmol/L (ref 3.5–5.1)
Sodium: 137 mmol/L (ref 135–145)
Total Bilirubin: 0.7 mg/dL (ref 0.3–1.2)
Total Protein: 7.1 g/dL (ref 6.5–8.1)

## 2022-07-29 LAB — CBC WITH DIFFERENTIAL/PLATELET
Abs Immature Granulocytes: 0.03 10*3/uL (ref 0.00–0.07)
Basophils Absolute: 0.1 10*3/uL (ref 0.0–0.1)
Basophils Relative: 1 %
Eosinophils Absolute: 0 10*3/uL (ref 0.0–0.5)
Eosinophils Relative: 0 %
HCT: 48.8 % — ABNORMAL HIGH (ref 36.0–46.0)
Hemoglobin: 15.6 g/dL — ABNORMAL HIGH (ref 12.0–15.0)
Immature Granulocytes: 0 %
Lymphocytes Relative: 9 %
Lymphs Abs: 1.1 10*3/uL (ref 0.7–4.0)
MCH: 29.9 pg (ref 26.0–34.0)
MCHC: 32 g/dL (ref 30.0–36.0)
MCV: 93.7 fL (ref 80.0–100.0)
Monocytes Absolute: 0.9 10*3/uL (ref 0.1–1.0)
Monocytes Relative: 7 %
Neutro Abs: 10.9 10*3/uL — ABNORMAL HIGH (ref 1.7–7.7)
Neutrophils Relative %: 83 %
Platelets: 260 10*3/uL (ref 150–400)
RBC: 5.21 MIL/uL — ABNORMAL HIGH (ref 3.87–5.11)
RDW: 13.6 % (ref 11.5–15.5)
WBC: 13.1 10*3/uL — ABNORMAL HIGH (ref 4.0–10.5)
nRBC: 0 % (ref 0.0–0.2)

## 2022-07-29 LAB — CK: Total CK: 196 U/L (ref 38–234)

## 2022-07-29 MED ORDER — FLUCONAZOLE 150 MG PO TABS
150.0000 mg | ORAL_TABLET | Freq: Once | ORAL | Status: AC
  Administered 2022-07-29: 150 mg via ORAL
  Filled 2022-07-29: qty 1

## 2022-07-29 MED ORDER — FLUCONAZOLE 150 MG PO TABS: 150.0000 mg | ORAL_TABLET | Freq: Every day | ORAL | 0 refills | Status: DC

## 2022-07-29 MED ORDER — NYSTATIN 100000 UNIT/GM EX POWD
Freq: Once | CUTANEOUS | Status: AC
  Filled 2022-07-29: qty 15

## 2022-07-29 MED ORDER — NYSTATIN 100000 UNIT/GM EX POWD: 1.0000 | Freq: Three times a day (TID) | CUTANEOUS | 0 refills | Status: AC

## 2022-07-29 MED ORDER — SULFAMETHOXAZOLE-TRIMETHOPRIM 800-160 MG PO TABS: 1.0000 | ORAL_TABLET | Freq: Two times a day (BID) | ORAL | 0 refills | Status: DC

## 2022-07-29 MED ORDER — SODIUM CHLORIDE 0.9 % IV BOLUS
1000.0000 mL | Freq: Once | INTRAVENOUS | Status: AC
  Administered 2022-07-29: 1000 mL via INTRAVENOUS

## 2022-07-29 NOTE — Discharge Instructions (Signed)
Take Bactrim twice daily for 5 days for UTI  I also prescribed Diflucan daily for 5 days  Please use nystatin powder 3 times a day  Stay hydrated and see your doctor  Return to ER if you have worse weakness, trouble urinating, abdominal pain or vomiting or fever

## 2022-07-29 NOTE — ED Triage Notes (Signed)
Patient BIB EMS from home c/o weakness. Per report pt was stuck in the toilet 4-5 hours. Patient report unable to get up d/t pain on her right leg. Patient currently taking antibiotic for her UTI. Patient denies N/V.  Patient a/ox3.

## 2022-07-29 NOTE — ED Provider Notes (Signed)
[place] EMERGENCY DEPARTMENT AT [facility] Provider Note   CSN: [id number] Arrival date & time: 07/29/22  1629     History  Chief Complaint  Patient presents with   Weakness    Ebony Ashley is a 77 y.o. female history of diabetes, recurrent UTI, A-fib on Eliquis here presenting with weakness.  Patient states that she sat on the toilet and was unable to get up today.  Patient states that she is weak all over and was complaining of right knee pain.  Patient just finished a course of antibiotics for UTI.  Patient has Klebsiella at that time.  Patient also has known yeast infection under the breast and is not currently on any medications.  She states that she usually gets yeast infection after she receives antibiotics.   The history is provided by the patient.       Home Medications Prior to Admission medications   Medication Sig Start Date End Date Taking? Authorizing Provider  amLODipine (NORVASC) 10 MG tablet TAKE 1 TABLET BY MOUTH EVERY DAY 02/20/22   Margaree Mackintosh, MD  apixaban (ELIQUIS) 5 MG TABS tablet Take 1 tablet (5 mg total) by mouth 2 (two) times daily. 05/13/22   Rollene Rotunda, MD  ciprofloxacin (CIPRO) 250 MG tablet Take 1 tablet (250 mg total) by mouth 2 (two) times daily. 07/13/22   Margaree Mackintosh, MD  Continuous Glucose Receiver (DEXCOM G7 RECEIVER) DEVI Use as directed 05/24/22   Margaree Mackintosh, MD  Continuous Glucose Sensor (DEXCOM G7 SENSOR) MISC Apply 1 sensor every 14 days 05/24/22   Margaree Mackintosh, MD  glipiZIDE (GLUCOTROL XL) 10 MG 24 hr tablet TAKE 1 TABLET (10 MG TOTAL) BY MOUTH DAILY WITH BREAKFAST. 12/14/21   Margaree Mackintosh, MD  metFORMIN (GLUCOPHAGE) 500 MG tablet Take 1 tablet (500 mg total) by mouth 2 (two) times daily. 05/24/22   Margaree Mackintosh, MD  metoprolol succinate (TOPROL-XL) 100 MG 24 hr tablet TAKE 1 TABLET WITH OR IMMEDIATELY FOLLOWING A MEAL 02/20/22   Margaree Mackintosh, MD  nitroGLYCERIN (NITROSTAT) 0.4 MG SL tablet Place 0.4 mg under  the tongue every 5 (five) minutes as needed.    [provider]  ramipril (ALTACE) 5 MG capsule TAKE 1 CAPSULE BY MOUTH EVERY DAY 04/18/22   Margaree Mackintosh, MD  rosuvastatin (CRESTOR) 5 MG tablet TAKE 1 TABLET (5 MG TOTAL) BY MOUTH 3 (THREE) TIMES A WEEK. MONDAYS, WEDNESDAYS AND FRIDAYS. 10/23/21   Margaree Mackintosh, MD  sitaGLIPtin (JANUVIA) 100 MG tablet Take 1 tablet (100 mg total) by mouth daily. 05/24/22   Margaree Mackintosh, MD  solifenacin (VESICARE) 10 MG tablet TAKE 1/2 TABLET BY MOUTH DAILY 10/22/21   Margaree Mackintosh, MD  traMADol (ULTRAM) 50 MG tablet TAKE 1 TABLET (50 MG TOTAL) BY MOUTH EVERY 8 (EIGHT) HOURS AS NEEDED. *MAX 7 DAYS 10/21/20   Margaree Mackintosh, MD  triamcinolone (KENALOG) 0.1 % APPLY TO AFFECTED AREA TWICE A DAY 01/14/20   Margaree Mackintosh, MD      Allergies    Adhesive [tape] and Polysporin [bacitracin-polymyxin b]    Review of Systems   Review of Systems  Neurological:  Positive for weakness.  All other systems reviewed and are negative.   Physical Exam Updated Vital Signs BP (!) 149/88   Pulse 96   Temp 98 F (36.7 C)   Resp 16   Ht 5\' 1"  (1.549 m)   Wt 127 kg  LMP 01/05/1988 (Approximate)   SpO2 93%   BMI 52.90 kg/m  Physical Exam Vitals and nursing note reviewed.  Constitutional:      Comments: Chronically ill  HENT:     Head: Normocephalic and atraumatic.     Nose: Nose normal.     Mouth/Throat:     Mouth: Mucous membranes are moist.  Eyes:     Extraocular Movements: Extraocular movements intact.     Pupils: Pupils are equal, round, and reactive to light.  Cardiovascular:     Rate and Rhythm: Normal rate and regular rhythm.     Pulses: Normal pulses.     Heart sounds: Normal heart sounds.  Pulmonary:     Effort: Pulmonary effort is normal.     Breath sounds: Normal breath sounds.  Abdominal:     General: Abdomen is flat.     Palpations: Abdomen is soft.  Musculoskeletal:     Cervical back: Normal range of motion and neck supple.      Comments: Mild tenderness over the right knee.  Skin:    Comments: Patient does have yeast infection under the left breast as well as under her pannus.  Neurological:     General: No focal deficit present.     Mental Status: She is alert and oriented to person, place, and time.  Psychiatric:        Mood and Affect: Mood normal.        Behavior: Behavior normal.     ED Results / Procedures / Treatments   Labs (all labs ordered are listed, but only abnormal results are displayed) Labs Reviewed  CBC WITH DIFFERENTIAL/PLATELET - Abnormal; Notable for the following components:      Result Value   WBC 13.1 (*)    RBC 5.21 (*)    Hemoglobin 15.6 (*)    HCT 48.8 (*)    Neutro Abs 10.9 (*)    All other components within normal limits  COMPREHENSIVE METABOLIC PANEL - Abnormal; Notable for the following components:   Glucose, Bld 199 (*)    All other components within normal limits  URINALYSIS, W/ REFLEX TO CULTURE (INFECTION SUSPECTED) - Abnormal; Notable for the following components:   Color, Urine STRAW (*)    Hgb urine dipstick MODERATE (*)    Ketones, ur 5 (*)    Protein, ur 30 (*)    Leukocytes,Ua MODERATE (*)    Bacteria, UA RARE (*)    All other components within normal limits  RESP PANEL BY RT-PCR (RSV, FLU A&B, COVID)  RVPGX2  CK    EKG EKG Interpretation Date/Time:  Thursday July 29 2022 16:51:44 EDT Ventricular Rate:  91 PR Interval:    QRS Duration:  71 QT Interval:  377 QTC Calculation: 462 R Axis:   122  Text Interpretation: Atrial fibrillation Right axis deviation Consider anterior infarct No previous ECGs available Confirmed by Richardean Canal ([id number]) on 07/29/2022 5:13:31 PM  Radiology DG Knee Right Port  Result Date: 07/29/2022 CLINICAL DATA:  Right knee pain EXAM: PORTABLE RIGHT KNEE - 1-2 VIEW COMPARISON:  None Available. FINDINGS: Early osteoarthritis with joint space narrowing most notable in the medial compartment and spurring most notable in the  patellofemoral compartment. No acute bony abnormality. Specifically, no fracture, subluxation, or dislocation. No joint effusion. IMPRESSION: Early osteoarthritis.  No acute bony abnormality. Electronically Signed   By: Charlett Nose M.D.   On: 07/29/2022 19:19   DG Pelvis Portable  Result Date: 07/29/2022 CLINICAL DATA:  Weakness,  pelvic pain. EXAM: PORTABLE PELVIS 1-2 VIEWS COMPARISON:  None Available. FINDINGS: No acute bony abnormality. Specifically, no fracture, subluxation, or dislocation. Hip joints and SI joints symmetric. IMPRESSION: No acute bony abnormality. Electronically Signed   By: Charlett Nose M.D.   On: 07/29/2022 19:18   DG Chest Port 1 View  Result Date: 07/29/2022 CLINICAL DATA:  Weakness EXAM: PORTABLE CHEST 1 VIEW COMPARISON:  10/09/2010 FINDINGS: The heart size and mediastinal contours are within normal limits. Both lungs are clear. The visualized skeletal structures are unremarkable. IMPRESSION: No active disease. Electronically Signed   By: Charlett Nose M.D.   On: 07/29/2022 19:17    Procedures Procedures    Medications Ordered in ED Medications  sodium chloride 0.9 % bolus 1,000 mL (0 mLs Intravenous Stopped 07/29/22 1947)  nystatin (MYCOSTATIN/NYSTOP) topical powder ( Topical Given 07/29/22 1710)    ED Course/ Medical Decision Making/ A&P                             Medical Decision Making JAKLYN ALEN is a 77 y.o. female here presenting with weakness and inability to stand.  Patient had no trauma or fall.  Patient was unable to get up from the commode for several hours.  Concern for possible rhabdo versus weakness from UTI.  Plan to get CBC and CMP and urinalysis.  Patient also has a yeast infection will give some nystatin  8:30 PM White blood cell count is 13.  UA showed possible UTI.  Chest x-ray is clear.  Patient is now ambulating.  CK level is normal.  Will give a course of antibiotics for UTI and also some Diflucan and nystatin for yeast  infection  Problems Addressed: Acute cystitis without hematuria: acute illness or injury Weakness: acute illness or injury Yeast dermatitis: acute illness or injury  Amount and/or Complexity of Data Reviewed Labs: ordered. Decision-making details documented in ED Course. Radiology: ordered and independent interpretation performed. Decision-making details documented in ED Course. ECG/medicine tests: ordered.  Risk Prescription drug management.    Final Clinical Impression(s) / ED Diagnoses Final diagnoses:  None    Rx / DC Orders ED Discharge Orders     None         Charlynne Pander, MD 07/29/22 2033

## 2022-08-01 ENCOUNTER — Other Ambulatory Visit: Payer: Self-pay

## 2022-08-01 ENCOUNTER — Emergency Department (HOSPITAL_COMMUNITY): Payer: PPO

## 2022-08-01 ENCOUNTER — Observation Stay (HOSPITAL_COMMUNITY): Payer: PPO

## 2022-08-01 ENCOUNTER — Encounter (HOSPITAL_COMMUNITY): Payer: Self-pay | Admitting: Internal Medicine

## 2022-08-01 ENCOUNTER — Inpatient Hospital Stay (HOSPITAL_COMMUNITY)
Admission: EM | Admit: 2022-08-01 | Discharge: 2022-08-06 | DRG: 689 | Disposition: A | Discharge status: Skilled Nursing Facility | Payer: PPO | Attending: Family Medicine | Admitting: Family Medicine

## 2022-08-01 DIAGNOSIS — E1165 Type 2 diabetes mellitus with hyperglycemia: Secondary | ICD-10-CM | POA: Diagnosis present

## 2022-08-01 DIAGNOSIS — N390 Urinary tract infection, site not specified: Secondary | ICD-10-CM | POA: Diagnosis not present

## 2022-08-01 DIAGNOSIS — E860 Dehydration: Secondary | ICD-10-CM | POA: Diagnosis not present

## 2022-08-01 DIAGNOSIS — L304 Erythema intertrigo: Secondary | ICD-10-CM

## 2022-08-01 DIAGNOSIS — R627 Adult failure to thrive: Secondary | ICD-10-CM | POA: Diagnosis not present

## 2022-08-01 DIAGNOSIS — Z8249 Family history of ischemic heart disease and other diseases of the circulatory system: Secondary | ICD-10-CM

## 2022-08-01 DIAGNOSIS — Z833 Family history of diabetes mellitus: Secondary | ICD-10-CM

## 2022-08-01 DIAGNOSIS — Z8261 Family history of arthritis: Secondary | ICD-10-CM

## 2022-08-01 DIAGNOSIS — Z7984 Long term (current) use of oral hypoglycemic drugs: Secondary | ICD-10-CM

## 2022-08-01 DIAGNOSIS — Z6841 Body Mass Index (BMI) 40.0 and over, adult: Secondary | ICD-10-CM

## 2022-08-01 DIAGNOSIS — Z8262 Family history of osteoporosis: Secondary | ICD-10-CM

## 2022-08-01 DIAGNOSIS — Z1152 Encounter for screening for COVID-19: Secondary | ICD-10-CM

## 2022-08-01 DIAGNOSIS — I482 Chronic atrial fibrillation, unspecified: Secondary | ICD-10-CM | POA: Diagnosis present

## 2022-08-01 DIAGNOSIS — Y92002 Bathroom of unspecified non-institutional (private) residence single-family (private) house as the place of occurrence of the external cause: Secondary | ICD-10-CM

## 2022-08-01 DIAGNOSIS — Z79899 Other long term (current) drug therapy: Secondary | ICD-10-CM

## 2022-08-01 DIAGNOSIS — I10 Essential (primary) hypertension: Secondary | ICD-10-CM | POA: Diagnosis present

## 2022-08-01 DIAGNOSIS — Z7901 Long term (current) use of anticoagulants: Secondary | ICD-10-CM

## 2022-08-01 DIAGNOSIS — E785 Hyperlipidemia, unspecified: Secondary | ICD-10-CM | POA: Diagnosis present

## 2022-08-01 DIAGNOSIS — G9341 Metabolic encephalopathy: Secondary | ICD-10-CM | POA: Diagnosis present

## 2022-08-01 DIAGNOSIS — T796XXA Traumatic ischemia of muscle, initial encounter: Secondary | ICD-10-CM

## 2022-08-01 DIAGNOSIS — X58XXXA Exposure to other specified factors, initial encounter: Secondary | ICD-10-CM | POA: Diagnosis present

## 2022-08-01 DIAGNOSIS — Z825 Family history of asthma and other chronic lower respiratory diseases: Secondary | ICD-10-CM

## 2022-08-01 LAB — URINALYSIS, W/ REFLEX TO CULTURE (INFECTION SUSPECTED)
Bilirubin Urine: NEGATIVE
Glucose, UA: 50 mg/dL — AB
Ketones, ur: 80 mg/dL — AB
Nitrite: NEGATIVE
Protein, ur: 100 mg/dL — AB
Specific Gravity, Urine: 1.015 (ref 1.005–1.030)
pH: 5 (ref 5.0–8.0)

## 2022-08-01 LAB — CBC WITH DIFFERENTIAL/PLATELET
Abs Immature Granulocytes: 0.06 10*3/uL (ref 0.00–0.07)
Basophils Absolute: 0.1 10*3/uL (ref 0.0–0.1)
Basophils Relative: 0 %
Eosinophils Absolute: 0 10*3/uL (ref 0.0–0.5)
Eosinophils Relative: 0 %
HCT: 53.5 % — ABNORMAL HIGH (ref 36.0–46.0)
Hemoglobin: 17 g/dL — ABNORMAL HIGH (ref 12.0–15.0)
Immature Granulocytes: 0 %
Lymphocytes Relative: 6 %
Lymphs Abs: 0.9 10*3/uL (ref 0.7–4.0)
MCH: 30.5 pg (ref 26.0–34.0)
MCHC: 31.8 g/dL (ref 30.0–36.0)
MCV: 95.9 fL (ref 80.0–100.0)
Monocytes Absolute: 1.2 10*3/uL — ABNORMAL HIGH (ref 0.1–1.0)
Monocytes Relative: 7 %
Neutro Abs: 14.2 10*3/uL — ABNORMAL HIGH (ref 1.7–7.7)
Neutrophils Relative %: 87 %
Platelets: 260 10*3/uL (ref 150–400)
RBC: 5.58 MIL/uL — ABNORMAL HIGH (ref 3.87–5.11)
RDW: 13.8 % (ref 11.5–15.5)
WBC: 16.5 10*3/uL — ABNORMAL HIGH (ref 4.0–10.5)
nRBC: 0 % (ref 0.0–0.2)

## 2022-08-01 LAB — COMPREHENSIVE METABOLIC PANEL
ALT: 25 U/L (ref 0–44)
AST: 50 U/L — ABNORMAL HIGH (ref 15–41)
Albumin: 3.7 g/dL (ref 3.5–5.0)
Alkaline Phosphatase: 93 U/L (ref 38–126)
Anion gap: 11 (ref 5–15)
BUN: 12 mg/dL (ref 8–23)
CO2: 26 mmol/L (ref 22–32)
Calcium: 8.9 mg/dL (ref 8.9–10.3)
Chloride: 101 mmol/L (ref 98–111)
Creatinine, Ser: 0.66 mg/dL (ref 0.44–1.00)
GFR, Estimated: >60 mL/min (ref >60)
Glucose, Bld: 161 mg/dL — ABNORMAL HIGH (ref 70–99)
Potassium: 4 mmol/L (ref 3.5–5.1)
Sodium: 138 mmol/L (ref 135–145)
Total Bilirubin: 1.1 mg/dL (ref 0.3–1.2)
Total Protein: 7.3 g/dL (ref 6.5–8.1)

## 2022-08-01 LAB — MAGNESIUM: Magnesium: 1.7 mg/dL (ref 1.7–2.4)

## 2022-08-01 LAB — CK: Total CK: 1562 U/L — ABNORMAL HIGH (ref 38–234)

## 2022-08-01 LAB — CBG MONITORING, ED: Glucose-Capillary: 210 mg/dL — ABNORMAL HIGH (ref 70–99)

## 2022-08-01 LAB — I-STAT CG4 LACTIC ACID, ED: Lactic Acid, Venous: <0.3 mmol/L — ABNORMAL LOW (ref 0.5–1.9)

## 2022-08-01 LAB — PHOSPHORUS: Phosphorus: 2.8 mg/dL (ref 2.5–4.6)

## 2022-08-01 LAB — SARS CORONAVIRUS 2 BY RT PCR: SARS Coronavirus 2 by RT PCR: NEGATIVE

## 2022-08-01 MED ORDER — ONDANSETRON HCL 4 MG/2ML IJ SOLN: 4.0000 mg | Freq: Four times a day (QID) | INTRAMUSCULAR | Status: DC | PRN

## 2022-08-01 MED ORDER — POLYETHYLENE GLYCOL 3350 17 G PO PACK: 17.0000 g | PACK | Freq: Two times a day (BID) | ORAL | Status: DC | PRN

## 2022-08-01 MED ORDER — ACETAMINOPHEN 650 MG RE SUPP: 650.0000 mg | Freq: Four times a day (QID) | RECTAL | Status: DC | PRN

## 2022-08-01 MED ORDER — HEPARIN SODIUM (PORCINE) 5000 UNIT/ML IJ SOLN
5000.0000 [IU] | Freq: Three times a day (TID) | INTRAMUSCULAR | Status: DC
  Administered 2022-08-02 (×2): 5000 [IU] via SUBCUTANEOUS
  Filled 2022-08-01 (×2): qty 1

## 2022-08-01 MED ORDER — LACTATED RINGERS IV SOLN: INTRAVENOUS | Status: AC

## 2022-08-01 MED ORDER — ACETAMINOPHEN 325 MG PO TABS
650.0000 mg | ORAL_TABLET | Freq: Four times a day (QID) | ORAL | Status: DC | PRN
  Administered 2022-08-02 – 2022-08-05 (×4): 650 mg via ORAL
  Filled 2022-08-01 (×4): qty 2

## 2022-08-01 MED ORDER — INSULIN ASPART 100 UNIT/ML IJ SOLN
0.0000 [IU] | Freq: Three times a day (TID) | INTRAMUSCULAR | Status: DC
  Administered 2022-08-02 – 2022-08-03 (×4): 2 [IU] via SUBCUTANEOUS
  Administered 2022-08-04: 3 [IU] via SUBCUTANEOUS
  Administered 2022-08-04: 2 [IU] via SUBCUTANEOUS
  Administered 2022-08-04 – 2022-08-06 (×6): 3 [IU] via SUBCUTANEOUS
  Filled 2022-08-01: qty 0.09

## 2022-08-01 MED ORDER — SODIUM CHLORIDE 0.9 % IV SOLN
1.0000 g | INTRAVENOUS | Status: DC
  Administered 2022-08-02 – 2022-08-03 (×3): 1 g via INTRAVENOUS
  Filled 2022-08-01 (×3): qty 10

## 2022-08-01 MED ORDER — KETOCONAZOLE 2 % EX CREA
TOPICAL_CREAM | Freq: Once | CUTANEOUS | Status: AC
  Filled 2022-08-01: qty 15

## 2022-08-01 MED ORDER — FLUCONAZOLE 200 MG PO TABS
200.0000 mg | ORAL_TABLET | Freq: Once | ORAL | Status: AC
  Administered 2022-08-01: 200 mg via ORAL
  Filled 2022-08-01: qty 1

## 2022-08-01 MED ORDER — ONDANSETRON HCL 4 MG PO TABS: 4.0000 mg | ORAL_TABLET | Freq: Four times a day (QID) | ORAL | Status: DC | PRN

## 2022-08-01 MED ORDER — SODIUM CHLORIDE 0.9 % IV BOLUS
1000.0000 mL | Freq: Once | INTRAVENOUS | Status: AC
  Administered 2022-08-01: 1000 mL via INTRAVENOUS

## 2022-08-01 MED ORDER — ADULT MULTIVITAMIN W/MINERALS CH
1.0000 | ORAL_TABLET | Freq: Every day | ORAL | Status: DC
  Administered 2022-08-02 – 2022-08-06 (×5): 1 via ORAL
  Filled 2022-08-01 (×5): qty 1

## 2022-08-01 MED ORDER — SODIUM CHLORIDE (PF) 0.9 % IJ SOLN
INTRAMUSCULAR | Status: AC
  Filled 2022-08-01: qty 50

## 2022-08-01 MED ORDER — IOHEXOL 300 MG/ML  SOLN
100.0000 mL | Freq: Once | INTRAMUSCULAR | Status: AC | PRN
  Administered 2022-08-01: 100 mL via INTRAVENOUS

## 2022-08-01 MED ORDER — BISACODYL 10 MG RE SUPP: 10.0000 mg | Freq: Every day | RECTAL | Status: DC | PRN

## 2022-08-01 NOTE — ED Notes (Signed)
Recollected light green blood tube and sent to lab for CMP

## 2022-08-01 NOTE — H&P (Signed)
History and Physical    [name] HQI:[id number] DOB: 03-12-1945 DOA: 08/01/2022  PCP: Margaree Mackintosh, MD   Patient coming from: home   Chief Complaint: weakness and recurrence of UTIs  HPI: Ebony Ashley is a 77 y.o. female with a pertinent history of dm, htn, afib on Eliquis, and frequent UTIs. severe obesity who comes to ED with weakness.   Pt did have a klebsiella uti in June for which she took Keflex.  She had another UTI earlier in July and took Cipro.  She came to the ED on the 25th and had another UTI.  She was d/c with a rx for bactrim, nystatin and fluconazole but did not get that med filled because of her weakness.  She states that since having these UTI's she hasn't been doing well, getting gradually weaker, progressively over the last month or 2 in the last couple days she has not really eaten much.  She thinks that yesterday she did not eat anything.  She has been having difficulty getting to the bathroom and having some frequency.  She has difficulty explaining it but feels like she is not able to get her urine out.  She wonders if it is associated to some constipation.  She is not sure when she had her last bowel movement.  lives alone, son lives in [place] and sounds like he was thinking about moving her to his house in [place].  They think that before the previous month or 2 she was fairly functional.  Has a cane in the room.  In the ED 173/101, HR 99, 98.2, 97%, RR 18.  NA 138, K4.0, CO2 26, glucose 161, creatinine 0.66, AST 50, ALT 25.  CK15 62, lactic acid negative.  WBC 16.5, Hgb 17.0, PLT 260.  Blood cultures were taken.  Chest x-ray showed cardiomegaly and mild pulmonary  vascular congestion. Urine culture from 6/18 was susceptible to ceftraixone.  She came to the ED on 7/25 as well and UA showed leukocytes.  no culture was done.  7/16 saw PCP who said urinary frequency had improved and UA on 7/16 showed trace leukocytes.   Review of Systems: As per HPI  otherwise 10 point review of systems negative.  Other pertinents as below:  General -she states she does feel a little confused, has had poor p.o. intake HEENT -denies any new headaches or visual changes Cardio -denies chest pain or palpitations Resp -denies any recent respiratory infection, cough, shortness of breath, orthopnea, new lower extremity edema GI -denies any nausea, vomiting, diarrhea but has been having some constipation. GU -as per HPI MSK -feels diffusely weak, does feel like she has a muscular pain to her left wrist but denies any pain of the proximal extremities Skin -has intertrigo Neuro -denies any new numbness or weakness Psych -denies any new anxiety or depression   Past Medical History:  Diagnosis Date   Atrial fibrillation (HCC)    Diabetes mellitus    Esophageal spasm    Esophagitis    Hiatal hernia    Hypertension     Past Surgical History:  Procedure Laterality Date   ABDOMINAL HYSTERECTOMY  1990   DUB, ovaries remain  Dr. Laurena Bering, off ERT 4/11   TONSILLECTOMY AND ADENOIDECTOMY  1951     reports that she has never smoked. She has been exposed to tobacco smoke. She has never used smokeless tobacco. She reports that she does not currently use alcohol. She reports that she does not use drugs.  Allergies  Allergen Reactions   Adhesive [Tape] Rash   Polysporin [Bacitracin-Polymyxin B] Rash    Family History  Problem Relation Age of Onset   Arthritis Mother    Atrial fibrillation Mother 62   Fibromyalgia Mother    Osteoporosis Mother    Colon cancer Mother    Hypertension Brother    Diabetes Brother    Asthma Brother     Prior to Admission medications   Medication Sig Start Date End Date Taking? Authorizing Provider  acetaminophen (TYLENOL) 500 MG tablet Take 500 mg by mouth every 6 (six) hours as needed for moderate pain.   Yes [provider]  apixaban (ELIQUIS) 5 MG TABS tablet Take 1 tablet (5 mg total) by mouth 2 (two) times daily.  05/13/22  Yes Rollene Rotunda, MD  glipiZIDE (GLUCOTROL XL) 10 MG 24 hr tablet TAKE 1 TABLET (10 MG TOTAL) BY MOUTH DAILY WITH BREAKFAST. 12/14/21  Yes Baxley, Luanna Cole, MD  metFORMIN (GLUCOPHAGE) 850 MG tablet Take 850 mg by mouth daily with breakfast.   Yes [provider]  nystatin (MYCOSTATIN/NYSTOP) powder Apply 1 Application topically 3 (three) times daily. 07/29/22  Yes Charlynne Pander, MD  sitaGLIPtin (JANUVIA) 100 MG tablet Take 1 tablet (100 mg total) by mouth daily. 05/24/22  Yes Baxley, Luanna Cole, MD  solifenacin (VESICARE) 10 MG tablet TAKE 1/2 TABLET BY MOUTH DAILY 10/22/21  Yes Baxley, Luanna Cole, MD  triamcinolone (KENALOG) 0.1 % APPLY TO AFFECTED AREA TWICE A DAY Patient taking differently: Apply 1 Application topically 2 (two) times daily as needed (itching). 01/14/20  Yes Margaree Mackintosh, MD  amLODipine (NORVASC) 10 MG tablet TAKE 1 TABLET BY MOUTH EVERY DAY 02/20/22   Margaree Mackintosh, MD  ciprofloxacin (CIPRO) 250 MG tablet Take 1 tablet (250 mg total) by mouth 2 (two) times daily. Patient not taking: Reported on 08/01/2022 07/13/22   Margaree Mackintosh, MD  Continuous Glucose Receiver (DEXCOM G7 RECEIVER) DEVI Use as directed 05/24/22   Margaree Mackintosh, MD  Continuous Glucose Sensor (DEXCOM G7 SENSOR) MISC Apply 1 sensor every 14 days 05/24/22   Margaree Mackintosh, MD  fluconazole (DIFLUCAN) 150 MG tablet Take 1 tablet (150 mg total) by mouth daily. 07/29/22   Charlynne Pander, MD  metFORMIN (GLUCOPHAGE) 500 MG tablet Take 1 tablet (500 mg total) by mouth 2 (two) times daily. Patient not taking: Reported on 08/01/2022 05/24/22   Margaree Mackintosh, MD  metoprolol succinate (TOPROL-XL) 100 MG 24 hr tablet TAKE 1 TABLET WITH OR IMMEDIATELY FOLLOWING A MEAL Patient not taking: Reported on 08/01/2022 02/20/22   Margaree Mackintosh, MD  ramipril (ALTACE) 5 MG capsule TAKE 1 CAPSULE BY MOUTH EVERY DAY Patient not taking: Reported on 08/01/2022 04/18/22   Margaree Mackintosh, MD  rosuvastatin (CRESTOR) 5 MG tablet TAKE  1 TABLET (5 MG TOTAL) BY MOUTH 3 (THREE) TIMES A WEEK. MONDAYS, WEDNESDAYS AND FRIDAYS. Patient not taking: Reported on 08/01/2022 10/23/21   Margaree Mackintosh, MD  sulfamethoxazole-trimethoprim (BACTRIM DS) 800-160 MG tablet Take 1 tablet by mouth 2 (two) times daily for 5 days. 07/29/22 08/03/22  Charlynne Pander, MD  traMADol (ULTRAM) 50 MG tablet TAKE 1 TABLET (50 MG TOTAL) BY MOUTH EVERY 8 (EIGHT) HOURS AS NEEDED. *MAX 7 DAYS Patient not taking: Reported on 08/01/2022 10/21/20   Margaree Mackintosh, MD    Physical Exam: Vitals:   08/01/22 1728 08/01/22 1812 08/01/22 1816 08/01/22 1830  BP: (!) 150/133   Marland Kitchen)  170/94  Pulse: (!) 106   93  Resp: 20   (!) 25  Temp:  98.4 F (36.9 C) 97.6 F (36.4 C)   TempSrc:   Oral   SpO2: 98%   93%  Weight:      Height:        Constitutional: NAD, comfortable, pleasant, Aox2, didn't know the month, obese Eyes: pupils equal and reactive to light, anicteric, without injection ENMT: MMM, throat without exudates or erythema Neck: normal, supple, no masses, no thyromegaly noted Respiratory: CTAB, nwob,    Cardiovascular: rrr w/o mrg, warm extremities Abdomen: NBS, NT,   Musculoskeletal: moving all 4 extremities, seems weak but still 5/5 in UE and LE's,  Skin: no rashes, lesions, ulcers. No induration Neurologic: CN 2-12 grossly intact. Sensation intact Psychiatric: AO appearing, mentation appropriate     Labs on Admission: I have personally reviewed following labs and imaging studies  CBC: Recent Labs  Lab 07/29/22 1705 08/01/22 1600  WBC 13.1* 16.5*  NEUTROABS 10.9* 14.2*  HGB 15.6* 17.0*  HCT 48.8* 53.5*  MCV 93.7 95.9  PLT 260 260   Basic Metabolic Panel: Recent Labs  Lab 07/29/22 1705 08/01/22 1804  NA 137 138  K 4.2 4.0  CL 100 101  CO2 24 26  GLUCOSE 199* 161*  BUN 12 12  CREATININE 0.78 0.66  CALCIUM 9.0 8.9   GFR: Estimated Creatinine Clearance: 73.9 mL/min (by C-G formula based on SCr of 0.66 mg/dL). Liver Function  Tests: Recent Labs  Lab 07/29/22 1705 08/01/22 1804  AST 23 50*  ALT 17 25  ALKPHOS 101 93  BILITOT 0.7 1.1  PROT 7.1 7.3  ALBUMIN 3.9 3.7   No results for input(s): "LIPASE", "AMYLASE" in the last 168 hours. No results for input(s): "AMMONIA" in the last 168 hours. Coagulation Profile: No results for input(s): "INR", "PROTIME" in the last 168 hours. Cardiac Enzymes: Recent Labs  Lab 07/29/22 1705 08/01/22 1804  CKTOTAL 196 1,562*   BNP (last 3 results) No results for input(s): "PROBNP" in the last 8760 hours. HbA1C: No results for input(s): "HGBA1C" in the last 72 hours. CBG: Recent Labs  Lab 08/01/22 1532  GLUCAP 210*   Lipid Profile: No results for input(s): "CHOL", "HDL", "LDLCALC", "TRIG", "CHOLHDL", "LDLDIRECT" in the last 72 hours. Thyroid Function Tests: No results for input(s): "TSH", "T4TOTAL", "FREET4", "T3FREE", "THYROIDAB" in the last 72 hours. Anemia Panel: No results for input(s): "VITAMINB12", "FOLATE", "FERRITIN", "TIBC", "IRON", "RETICCTPCT" in the last 72 hours. Urine analysis:    Component Value Date/Time   COLORURINE YELLOW 08/01/2022 1508   APPEARANCEUR CLOUDY (A) 08/01/2022 1508   LABSPEC 1.015 08/01/2022 1508   PHURINE 5.0 08/01/2022 1508   GLUCOSEU 50 (A) 08/01/2022 1508   HGBUR MODERATE (A) 08/01/2022 1508   BILIRUBINUR NEGATIVE 08/01/2022 1508   BILIRUBINUR neg 07/20/2022 1537   KETONESUR 80 (A) 08/01/2022 1508   PROTEINUR 100 (A) 08/01/2022 1508   UROBILINOGEN 0.2 07/20/2022 1537   NITRITE NEGATIVE 08/01/2022 1508   LEUKOCYTESUR SMALL (A) 08/01/2022 1508    Radiological Exams on Admission: DG Chest Portable 1 View  Result Date: 08/01/2022 CLINICAL DATA:  Altered mental status. Recent urinary tract infection. EXAM: PORTABLE CHEST 1 VIEW COMPARISON:  One-view chest x-ray 07/29/2022 FINDINGS: The heart is enlarged. Mild pulmonary vascular congestion present. Lung volumes are low. No focal airspace consolidation present. No edema or  effusion is present IMPRESSION: Cardiomegaly and mild pulmonary vascular congestion. Electronically Signed   By: Virl Son.D.  On: 08/01/2022 16:28    EKG: Independently reviewed.    Assessment/Plan Principal Problem:   Recurrent UTI     #Leukocytosis, possibly from a recurrent UTI, she also states that she has been quite constipated and wonders if this is causing some of her urinary symptoms.  Doubt stercoral colitis.  Do not see any signs of abscess in her wounds.  Fluconazole 200 mg one-time dose was given in the ED. --ct abd below # Intertrigo, fluconazole from ED, wound care consult  #Confusion, fatigue, think patient tired from not sleeping well Tsh --ct head since had fall 6/18  #Urinary frequency -wonder if overflow incontinence and trying to do a postvoid residual and then even In-N-Out cath and asked the nurse to document this.  I also explained that constipation can cause this frequency.  Trying to add on a culture.  Will start empiric ceftriaxone for now - Follow-up cultures --don't feel like KUB would be fruitful so will do CT scan of the abdomen/pelv since this has been going on for 1-2 months and failed UTI --consider urology in outpatient --will hold vesicare for now  #Weakness, failure to thrive.  At this moment, I think this is just being gradual deconditioning on top of not eating the past couple days -- With CK elevation, doubt rhabdo, consider a myositis but doubtful. PT consult -- I talked with the son on the phone and thought that given her weakness she might require SNF or the son was thinking about bringing her to her house  #on 2021 echo mild left atrial enlargement and moderate pulmonary hypertension, doubt but possible cause of weakness, no significant LE edema, or othpnea but will get BNP Follows w dr. Antoine Poche in the outpatient  #poorly controlled dm - on januvia, metformin, glipizide. hgba1c has been improving in the outaptient. Hold orals,  do ssi   #htn - pcp note saids ramipril and metoprolol but she says not taking.  Will hold until have better idea of what is going on. hld- crestor chronic afib, holding eliquis until ct head comes back with confusoin Erythrocytosis likely from dehydration Severe obesity  Patient and/or Family completely agreed with the plan, expressed understanding and I answered all questions.  DVT prophylaxis: Heparin SQ Code Status: Full code w son on the phone.  Would want Mellody Dance, the son, to make medical decisions even though there is another daughter who is also next of kin Family Communication: keith on the phone . Mellody Dance said he would like to be updated daily on the phone.  He was willing to be called when doctor goes into the room with patient. Disposition Plan: I suspect SNF or needing to go to son's house, Mellody Dance, who was thinking about this previously  Consults called: wound care Admission status: observation for now    A total of 83 minutes utilized during this admission.  Charlane Ferretti DO [facility]   If 7PM-7AM, please contact night-coverage [email or web address] Password [facility]  08/01/2022, 8:53 PM

## 2022-08-01 NOTE — Consult Note (Signed)
WOC Nurse Consult Note: Reason for Consult:erythema intertrigo (irritant contact dermatitis). Recent and prolonged course of antibiotic therapy Wound type:Moisture, irritant contact dermatitis ICD-10 CM Codes for Irritant Dermatitis  L24A2 - Due to fecal, urinary or dual incontinence L24A9 - Due to friction or contact with other  L30.4  - Erythema intertrigo. Also used for abrasion of the hand, chafing of the skin, dermatitis due to sweating and friction, friction dermatitis, friction eczema, and genital/thigh intertrigo.   Pressure Injury POA: N/A Measurement:diffuse intertriginous dermatitis Wound bed:red, moist Drainage (amount, consistency, odor) serous Periwound: erythematous  Dressing procedure/placement/frequency: I have provided guidance for the care of the integumentary system using our house antimicrobial moisture wicking textile, Arneta Cliche [employer] [place] # [phone]).   Recommend consideration of a few doses of systemic antifungal, e.g.Diflucan unless contraindicated.  Turning and repositioning to minimize time in the supine position is recommended for PI prevention.  WOC nursing team will not follow, but will remain available to this patient, the nursing and medical teams.  Please re-consult if needed.  Thank you for inviting [place] to participate in this patient's Plan of Care.  Ladona Mow, MSN, RN, CNS, GNP, Leda Min, [facility], [employer] phone:  [phone] .

## 2022-08-01 NOTE — ED Triage Notes (Addendum)
Pt arrives via [facility] from home where she lives alone with report of AMS of unknown time. Pt recently sen in ED for UTI 3 days ago. EMS relates pt has been on the toilet since 0600. Pt weakness for unknown time, can answer questions appropriately. Presents with skin breakdown to chest and abdomen skinfolds. Has skin breakdown on her bottom as well. 20g Lt FA via EMS BGL 242.

## 2022-08-01 NOTE — ED Provider Notes (Signed)
[place] EMERGENCY DEPARTMENT AT [facility] Provider Note   CSN: [id number] Arrival date & time: 08/01/22  1401     History  Chief Complaint  Patient presents with   Altered Mental Status    Ebony Ashley is a 77 y.o. female.  Pt is a 77 yo female with pmhx significant for dm, htn, afib on Eliquis, and frequent UTIs.  Pt did have a klebsiella uti in June for which she took Keflex.  She had another UTI earlier in July and took Cipro.  She came to the ED on the 25th and had another UTI.  She was d/c with a rx for bactrim, but did not get that med filled. She said she was too weak to get to the pharmacy.  She said she has continued to feel very weak.  She sat on the toilet this am around 0600 and has not been able to get off the toilet all day.  She finally called EMS.  She also has skin breakdown in her groin, breast, and bottom.  She denies fevers.  She lives alone.  Her family lives in [place].       Home Medications Prior to Admission medications   Medication Sig Start Date End Date Taking? Authorizing Provider  acetaminophen (TYLENOL) 500 MG tablet Take 500 mg by mouth every 6 (six) hours as needed for moderate pain.   Yes [provider]  apixaban (ELIQUIS) 5 MG TABS tablet Take 1 tablet (5 mg total) by mouth 2 (two) times daily. 05/13/22  Yes Rollene Rotunda, MD  glipiZIDE (GLUCOTROL XL) 10 MG 24 hr tablet TAKE 1 TABLET (10 MG TOTAL) BY MOUTH DAILY WITH BREAKFAST. 12/14/21  Yes Baxley, Luanna Cole, MD  metFORMIN (GLUCOPHAGE) 850 MG tablet Take 850 mg by mouth daily with breakfast.   Yes [provider]  nystatin (MYCOSTATIN/NYSTOP) powder Apply 1 Application topically 3 (three) times daily. 07/29/22  Yes Charlynne Pander, MD  sitaGLIPtin (JANUVIA) 100 MG tablet Take 1 tablet (100 mg total) by mouth daily. 05/24/22  Yes Baxley, Luanna Cole, MD  solifenacin (VESICARE) 10 MG tablet TAKE 1/2 TABLET BY MOUTH DAILY 10/22/21  Yes Baxley, Luanna Cole, MD   triamcinolone (KENALOG) 0.1 % APPLY TO AFFECTED AREA TWICE A DAY Patient taking differently: Apply 1 Application topically 2 (two) times daily as needed (itching). 01/14/20  Yes Margaree Mackintosh, MD  amLODipine (NORVASC) 10 MG tablet TAKE 1 TABLET BY MOUTH EVERY DAY 02/20/22   Margaree Mackintosh, MD  ciprofloxacin (CIPRO) 250 MG tablet Take 1 tablet (250 mg total) by mouth 2 (two) times daily. Patient not taking: Reported on 08/01/2022 07/13/22   Margaree Mackintosh, MD  Continuous Glucose Receiver (DEXCOM G7 RECEIVER) DEVI Use as directed 05/24/22   Margaree Mackintosh, MD  Continuous Glucose Sensor (DEXCOM G7 SENSOR) MISC Apply 1 sensor every 14 days 05/24/22   Margaree Mackintosh, MD  fluconazole (DIFLUCAN) 150 MG tablet Take 1 tablet (150 mg total) by mouth daily. 07/29/22   Charlynne Pander, MD  metFORMIN (GLUCOPHAGE) 500 MG tablet Take 1 tablet (500 mg total) by mouth 2 (two) times daily. Patient not taking: Reported on 08/01/2022 05/24/22   Margaree Mackintosh, MD  metoprolol succinate (TOPROL-XL) 100 MG 24 hr tablet TAKE 1 TABLET WITH OR IMMEDIATELY FOLLOWING A MEAL Patient not taking: Reported on 08/01/2022 02/20/22   Margaree Mackintosh, MD  ramipril (ALTACE) 5 MG capsule TAKE 1 CAPSULE BY MOUTH EVERY DAY Patient not  taking: Reported on 08/01/2022 04/18/22   Margaree Mackintosh, MD  rosuvastatin (CRESTOR) 5 MG tablet TAKE 1 TABLET (5 MG TOTAL) BY MOUTH 3 (THREE) TIMES A WEEK. MONDAYS, WEDNESDAYS AND FRIDAYS. Patient not taking: Reported on 08/01/2022 10/23/21   Margaree Mackintosh, MD  sulfamethoxazole-trimethoprim (BACTRIM DS) 800-160 MG tablet Take 1 tablet by mouth 2 (two) times daily for 5 days. 07/29/22 08/03/22  Charlynne Pander, MD  traMADol (ULTRAM) 50 MG tablet TAKE 1 TABLET (50 MG TOTAL) BY MOUTH EVERY 8 (EIGHT) HOURS AS NEEDED. *MAX 7 DAYS Patient not taking: Reported on 08/01/2022 10/21/20   Margaree Mackintosh, MD      Allergies    Adhesive [tape] and Polysporin [bacitracin-polymyxin b]    Review of Systems   Review of  Systems  Genitourinary:  Positive for dysuria.  Skin:  Positive for wound.  Neurological:  Positive for weakness.  All other systems reviewed and are negative.   Physical Exam Updated Vital Signs BP (!) 170/94   Pulse 93   Temp 97.6 F (36.4 C) (Oral)   Resp (!) 25   Ht 5\' 1"  (1.549 m)   Wt 127 kg   LMP 01/05/1988 (Approximate)   SpO2 93%   BMI 52.90 kg/m  Physical Exam Vitals and nursing note reviewed.  Constitutional:      Appearance: Normal appearance. She is obese.  HENT:     Head: Normocephalic and atraumatic.     Right Ear: External ear normal.     Left Ear: External ear normal.     Nose: Nose normal.     Mouth/Throat:     Mouth: Mucous membranes are dry.  Eyes:     Extraocular Movements: Extraocular movements intact.     Conjunctiva/sclera: Conjunctivae normal.     Pupils: Pupils are equal, round, and reactive to light.  Cardiovascular:     Rate and Rhythm: Normal rate. Rhythm irregular.     Pulses: Normal pulses.     Heart sounds: Normal heart sounds.  Pulmonary:     Effort: Pulmonary effort is normal.     Breath sounds: Normal breath sounds.  Abdominal:     General: Abdomen is flat. Bowel sounds are normal.     Palpations: Abdomen is soft.  Musculoskeletal:        General: Normal range of motion.     Cervical back: Normal range of motion and neck supple.  Skin:    Capillary Refill: Capillary refill takes less than 2 seconds.     Comments: Significant intertrigo under both breasts and groin creases  Neurological:     General: No focal deficit present.     Mental Status: She is alert and oriented to person, place, and time.  Psychiatric:        Mood and Affect: Mood normal.        Behavior: Behavior normal.     ED Results / Procedures / Treatments   Labs (all labs ordered are listed, but only abnormal results are displayed) Labs Reviewed  CBC WITH DIFFERENTIAL/PLATELET - Abnormal; Notable for the following components:      Result Value   WBC  16.5 (*)    RBC 5.58 (*)    Hemoglobin 17.0 (*)    HCT 53.5 (*)    Neutro Abs 14.2 (*)    Monocytes Absolute 1.2 (*)    All other components within normal limits  URINALYSIS, W/ REFLEX TO CULTURE (INFECTION SUSPECTED) - Abnormal; Notable for the following components:  APPearance CLOUDY (*)    Glucose, UA 50 (*)    Hgb urine dipstick MODERATE (*)    Ketones, ur 80 (*)    Protein, ur 100 (*)    Leukocytes,Ua SMALL (*)    Bacteria, UA RARE (*)    All other components within normal limits  CK - Abnormal; Notable for the following components:   Total CK 1,562 (*)    All other components within normal limits  COMPREHENSIVE METABOLIC PANEL - Abnormal; Notable for the following components:   Glucose, Bld 161 (*)    AST 50 (*)    All other components within normal limits  CBG MONITORING, ED - Abnormal; Notable for the following components:   Glucose-Capillary 210 (*)    All other components within normal limits  I-STAT CG4 LACTIC ACID, ED - Abnormal; Notable for the following components:   Lactic Acid, Venous <0.3 (*)    All other components within normal limits  CULTURE, BLOOD (ROUTINE X 2)  CULTURE, BLOOD (ROUTINE X 2)  SARS CORONAVIRUS 2 BY RT PCR  HEMOGLOBIN A1C    EKG EKG Interpretation Date/Time:  Sunday August 01 2022 17:47:20 EDT Ventricular Rate:  104 PR Interval:    QRS Duration:  84 QT Interval:  318 QTC Calculation: 419 R Axis:   97  Text Interpretation: Atrial fibrillation Right axis deviation Low voltage, extremity leads Minimal ST depression, diffuse leads Since last tracing rate faster Confirmed by Jacalyn Lefevre [phone]) on 08/01/2022 6:00:55 PM  Radiology DG Chest Portable 1 View  Result Date: 08/01/2022 CLINICAL DATA:  Altered mental status. Recent urinary tract infection. EXAM: PORTABLE CHEST 1 VIEW COMPARISON:  One-view chest x-ray 07/29/2022 FINDINGS: The heart is enlarged. Mild pulmonary vascular congestion present. Lung volumes are low. No focal airspace  consolidation present. No edema or effusion is present IMPRESSION: Cardiomegaly and mild pulmonary vascular congestion. Electronically Signed   By: Marin Roberts M.D.   On: 08/01/2022 16:28    Procedures Procedures    Medications Ordered in ED Medications  fluconazole (DIFLUCAN) tablet 200 mg (has no administration in time range)  sodium chloride 0.9 % bolus 1,000 mL (1,000 mLs Intravenous New Bag/Given 08/01/22 1603)  ketoconazole (NIZORAL) 2 % cream ( Topical Given 08/01/22 1742)    ED Course/ Medical Decision Making/ A&P                             Medical Decision Making Amount and/or Complexity of Data Reviewed Labs: ordered. Radiology: ordered.  Risk Prescription drug management. Decision regarding hospitalization.   This patient presents to the ED for concern of weakness, this involves an extensive number of treatment options, and is a complaint that carries with it a high risk of complications and morbidity.  The differential diagnosis includes uti, electrolyte abn, rhabdo, anemia   Co morbidities that complicate the patient evaluation  dm, htn, afib on Eliquis, and frequent UTIs   Additional history obtained:  Additional history obtained from epic chart review External records from outside source obtained and reviewed including EMS report   Lab Tests:  I Ordered, and personally interpreted labs.  The pertinent results include:  cbc with wbc elevated at 16.5, lactic nl, ua with ketones/protein.  No uti; ck elevated at 1562   Imaging Studies ordered:  I ordered imaging studies including cxr  I independently visualized and interpreted imaging which showed Cardiomegaly and mild pulmonary vascular congestion.  I agree with the radiologist interpretation  Cardiac Monitoring:  The patient was maintained on a cardiac monitor.  I personally viewed and interpreted the cardiac monitored which showed an underlying rhythm of: afib   Medicines ordered and  prescription drug management:  I ordered medication including ivfs  for dehydration  Reevaluation of the patient after these medicines showed that the patient improved I have reviewed the patients home medicines and have made adjustments as needed   Critical Interventions:  ivfs   Consultations Obtained:  I requested consultation with the hospitalist (Dr. Thornell Mule),  and discussed lab and imaging findings as well as pertinent plan - he will admit   Problem List / ED Course:  Rhabdomyolysis:  likely from sitting on the commode all day Dehydration:  pt given ivfs Generalized weakness:  etiology unclear.  No UTI.  Covid added on. Intertrigo:  likely from dm, obesity, and abx.  Antifungal cream and diflucan given   Reevaluation:  After the interventions noted above, I reevaluated the patient and found that they have :improved   Social Determinants of Health:  Lives alone   Dispostion:  After consideration of the diagnostic results and the patients response to treatment, I feel that the patent would benefit from admission.          Final Clinical Impression(s) / ED Diagnoses Final diagnoses:  Dehydration  Traumatic rhabdomyolysis, initial encounter (HCC)  Failure to thrive in adult  Intertrigo    Rx / DC Orders ED Discharge Orders     None         Jacalyn Lefevre, MD 08/01/22 1942

## 2022-08-02 ENCOUNTER — Observation Stay (HOSPITAL_COMMUNITY): Payer: PPO

## 2022-08-02 ENCOUNTER — Encounter (HOSPITAL_COMMUNITY): Payer: Self-pay | Admitting: Internal Medicine

## 2022-08-02 DIAGNOSIS — G9341 Metabolic encephalopathy: Secondary | ICD-10-CM | POA: Diagnosis present

## 2022-08-02 DIAGNOSIS — Z79899 Other long term (current) drug therapy: Secondary | ICD-10-CM | POA: Diagnosis not present

## 2022-08-02 DIAGNOSIS — Z6841 Body Mass Index (BMI) 40.0 and over, adult: Secondary | ICD-10-CM | POA: Diagnosis not present

## 2022-08-02 DIAGNOSIS — Z833 Family history of diabetes mellitus: Secondary | ICD-10-CM | POA: Diagnosis not present

## 2022-08-02 DIAGNOSIS — Y92002 Bathroom of unspecified non-institutional (private) residence single-family (private) house as the place of occurrence of the external cause: Secondary | ICD-10-CM | POA: Diagnosis not present

## 2022-08-02 DIAGNOSIS — Z8262 Family history of osteoporosis: Secondary | ICD-10-CM | POA: Diagnosis not present

## 2022-08-02 DIAGNOSIS — Z7984 Long term (current) use of oral hypoglycemic drugs: Secondary | ICD-10-CM | POA: Diagnosis not present

## 2022-08-02 DIAGNOSIS — Z8261 Family history of arthritis: Secondary | ICD-10-CM | POA: Diagnosis not present

## 2022-08-02 DIAGNOSIS — N390 Urinary tract infection, site not specified: Secondary | ICD-10-CM | POA: Diagnosis present

## 2022-08-02 DIAGNOSIS — Z7901 Long term (current) use of anticoagulants: Secondary | ICD-10-CM | POA: Diagnosis not present

## 2022-08-02 DIAGNOSIS — I482 Chronic atrial fibrillation, unspecified: Secondary | ICD-10-CM | POA: Diagnosis present

## 2022-08-02 DIAGNOSIS — E860 Dehydration: Secondary | ICD-10-CM | POA: Diagnosis present

## 2022-08-02 DIAGNOSIS — E1165 Type 2 diabetes mellitus with hyperglycemia: Secondary | ICD-10-CM | POA: Diagnosis present

## 2022-08-02 DIAGNOSIS — R627 Adult failure to thrive: Secondary | ICD-10-CM | POA: Diagnosis present

## 2022-08-02 DIAGNOSIS — Z825 Family history of asthma and other chronic lower respiratory diseases: Secondary | ICD-10-CM | POA: Diagnosis not present

## 2022-08-02 DIAGNOSIS — L304 Erythema intertrigo: Secondary | ICD-10-CM | POA: Diagnosis present

## 2022-08-02 DIAGNOSIS — E785 Hyperlipidemia, unspecified: Secondary | ICD-10-CM | POA: Diagnosis present

## 2022-08-02 DIAGNOSIS — Z1152 Encounter for screening for COVID-19: Secondary | ICD-10-CM | POA: Diagnosis not present

## 2022-08-02 DIAGNOSIS — T796XXA Traumatic ischemia of muscle, initial encounter: Secondary | ICD-10-CM | POA: Diagnosis present

## 2022-08-02 DIAGNOSIS — Z8249 Family history of ischemic heart disease and other diseases of the circulatory system: Secondary | ICD-10-CM | POA: Diagnosis not present

## 2022-08-02 DIAGNOSIS — I10 Essential (primary) hypertension: Secondary | ICD-10-CM | POA: Diagnosis present

## 2022-08-02 DIAGNOSIS — X58XXXA Exposure to other specified factors, initial encounter: Secondary | ICD-10-CM | POA: Diagnosis present

## 2022-08-02 LAB — GLUCOSE, CAPILLARY
Glucose-Capillary: 132 mg/dL — ABNORMAL HIGH (ref 70–99)
Glucose-Capillary: 151 mg/dL — ABNORMAL HIGH (ref 70–99)
Glucose-Capillary: 168 mg/dL — ABNORMAL HIGH (ref 70–99)
Glucose-Capillary: 174 mg/dL — ABNORMAL HIGH (ref 70–99)
Glucose-Capillary: 175 mg/dL — ABNORMAL HIGH (ref 70–99)

## 2022-08-02 MED ORDER — FLUCONAZOLE IN SODIUM CHLORIDE 200-0.9 MG/100ML-% IV SOLN
200.0000 mg | INTRAVENOUS | Status: DC
  Administered 2022-08-02 – 2022-08-05 (×4): 200 mg via INTRAVENOUS
  Filled 2022-08-02 (×4): qty 100

## 2022-08-02 MED ORDER — HYDRALAZINE HCL 20 MG/ML IJ SOLN
10.0000 mg | Freq: Four times a day (QID) | INTRAMUSCULAR | Status: DC | PRN
  Filled 2022-08-02: qty 1

## 2022-08-02 MED ORDER — APIXABAN 5 MG PO TABS
5.0000 mg | ORAL_TABLET | Freq: Two times a day (BID) | ORAL | Status: DC
  Administered 2022-08-02 – 2022-08-06 (×8): 5 mg via ORAL
  Filled 2022-08-02 (×8): qty 1

## 2022-08-02 NOTE — Evaluation (Signed)
Physical Therapy Evaluation Patient Details Name: Ebony Ashley MRN: [id number] DOB: 05-26-1945 Today's Date: 08/02/2022  History of Present Illness  Pt is 77 yo female admitted on 08/01/22 with generalized weakness secondary to recurrent UTI. Pt with hx including but not limited to DM, HTN, afib, and frequent UTI  Clinical Impression  Pt admitted with above diagnosis. At baseline, pt resides alone and is independent.  She has been using a cane for short community ambulation.  Pt reports over the last month increasing weakness - mostly in quads and has difficulty standing from toilet.  She denies any back pain or numbness/tingling  Today, pt required min to mod A for transfers and ambulate 20' in room with RW.  Reports muscles burning in thighs with walking.  Educated on tips to improve standing and recommendation for [facility] or elevated toilet seat with arm rest.  As pt lives alone and requiring min-mod A for transfers, she may benefit from  continued inpatient follow up therapy, <3 hours/day at discharge.  Pt does have good potential to progress.  Pt currently with functional limitations due to the deficits listed below (see PT Problem List). Pt will benefit from acute skilled PT to increase their independence and safety with mobility to allow discharge.           If plan is discharge home, recommend the following: A little help with walking and/or transfers;A little help with bathing/dressing/bathroom;Help with stairs or ramp for entrance;Assistance with cooking/housework   Can travel by private vehicle   Yes    Equipment Recommendations BSC/3in1 (or elevated toilet seat)  Recommendations for Other Services       Functional Status Assessment Patient has had a recent decline in their functional status and demonstrates the ability to make significant improvements in function in a reasonable and predictable amount of time.     Precautions / Restrictions Precautions Precautions: Fall       Mobility  Bed Mobility Overal bed mobility: Needs Assistance Bed Mobility: Supine to Sit     Supine to sit: Min assist, HOB elevated     General bed mobility comments: increased time    Transfers Overall transfer level: Needs assistance Equipment used: Rolling walker (2 wheels) Transfers: Sit to/from Stand Sit to Stand: Min assist, Mod assist           General transfer comment: Mod A from bed but able to stand 3 x from chair using armrest with min A.  Pt educated on standing technqiues (scooting forward, feet back, pushing up, and nose over toes) as she reports this is where she has most difficulty.  REports she has often gotten stuck on toilet and unable to stand - educated on [facility] or elevated toiet seat    Ambulation/Gait Ambulation/Gait assistance: Editor, commissioning (Feet): 25 Feet Assistive device: Rolling walker (2 wheels) Gait Pattern/deviations: Step-through pattern, Decreased stance time - left, Shuffle Gait velocity: decreased     General Gait Details: fatigued easily; reports thighs burning  Stairs            Wheelchair Mobility     Tilt Bed    Modified Rankin (Stroke Patients Only)       Balance Overall balance assessment: Needs assistance Sitting-balance support: No upper extremity supported Sitting balance-Leahy Scale: Good     Standing balance support: Bilateral upper extremity supported, Reliant on assistive device for balance Standing balance-Leahy Scale: Poor  Pertinent Vitals/Pain Pain Assessment Pain Assessment: No/denies pain    Home Living Family/patient expects to be discharged to:: Private residence Living Arrangements: Alone Available Help at Discharge: Friend(s);Available PRN/intermittently (friend next door, son in [place]) Type of Home: House Home Access: Level entry       Home Layout: One level Home Equipment: Pharmacist, hospital (2 wheels) Additional  Comments: Hurri-cane    Prior Function Prior Level of Function : Independent/Modified Independent             Mobility Comments: Does short community ambulation regularly with cane; in home wihtout AD; can ambulate in store but uses grocery cart; just the one fall in last 6 months and was associated on LE weakness (reason for admission( ADLs Comments: independent adls and iadls     Hand Dominance        Extremity/Trunk Assessment   Upper Extremity Assessment Upper Extremity Assessment: Overall WFL for tasks assessed    Lower Extremity Assessment Lower Extremity Assessment: RLE deficits/detail;LLE deficits/detail RLE Deficits / Details: ROM WFL; MMT: ankle 5/5, knee ext 4/5, hip flex 3/5; Report bil thighs feel weak and burn with activity, states R LE is generally her weaker leg for at least a couple of months LLE Deficits / Details: ROM WFL; MMT ankle 5/5, knee ext 4+/5, hip flex 3/5;Report bil thighs feel weak and burn with activity    Cervical / Trunk Assessment Cervical / Trunk Assessment: Other exceptions Cervical / Trunk Exceptions: obesity  Communication   Communication: No difficulties  Cognition Arousal/Alertness: Awake/alert Behavior During Therapy: WFL for tasks assessed/performed Overall Cognitive Status: Within Functional Limits for tasks assessed                                          General Comments General comments (skin integrity, edema, etc.): Pt denies any back pain or numbness and numbness/tingling in legs or previous leg injuries.  Just reports thighs are weak and burn with activity.    Exercises     Assessment/Plan    PT Assessment Patient needs continued PT services  PT Problem List Decreased strength;Decreased mobility;Decreased activity tolerance;Decreased knowledge of use of DME;Decreased balance;Pain       PT Treatment Interventions DME instruction;Therapeutic activities;Modalities;Gait training;Therapeutic  exercise;Patient/family education;Stair training;Balance training;Functional mobility training;Neuromuscular re-education    PT Goals (Current goals can be found in the Care Plan section)  Acute Rehab PT Goals Patient Stated Goal: get stronger PT Goal Formulation: With patient Time For Goal Achievement: 08/16/22 Potential to Achieve Goals: Good    Frequency       Co-evaluation               AM-PAC PT "6 Clicks" Mobility  Outcome Measure Help needed turning from your back to your side while in a flat bed without using bedrails?: A Little Help needed moving from lying on your back to sitting on the side of a flat bed without using bedrails?: A Little Help needed moving to and from a bed to a chair (including a wheelchair)?: A Little Help needed standing up from a chair using your arms (e.g., wheelchair or bedside chair)?: A Lot Help needed to walk in hospital room?: A Lot Help needed climbing 3-5 steps with a railing? : Total 6 Click Score: 14    End of Session Equipment Utilized During Treatment: Gait belt Activity Tolerance: Patient tolerated treatment well Patient left: in  chair;with call bell/phone within reach Nurse Communication: Mobility status (purewick not in place - recommend use of BSC) PT Visit Diagnosis: Other abnormalities of gait and mobility (R26.89);Muscle weakness (generalized) (M62.81)    Time: 1350-1416 PT Time Calculation (min) (ACUTE ONLY): 26 min   Charges:   PT Evaluation $PT Eval Low Complexity: 1 Low PT Treatments $Therapeutic Activity: 8-22 mins PT General Charges $$ ACUTE PT VISIT: 1 Visit         Anise Salvo, PT Acute Rehab [facility] [phone]   Rayetta Humphrey 08/02/2022, 2:36 PM

## 2022-08-02 NOTE — Progress Notes (Signed)
PROGRESS NOTE    Ebony Ashley  GMW:[id number] DOB: April 18, 1945 DOA: 08/01/2022  PCP: Margaree Mackintosh, MD    Brief Narrative:  This 77 yrs old female with PMH significant for DM, HTN, A.fib on Eliquis, and frequent UTIs, severe obesity who presented to the ED with generalized weakness.  Patient was recently treated 3 times for UTI in last two months. Patient reports not feeling well since she has last UTI, she is getting gradual weakness which has been progressive for loss 2 months. She reports difficulty getting to the bathroom and unable to get up from the commode.  She lives alone,  her son lives in [place] and considering about moving her to his house in [place].  Pertinent labs in the ED CK15 62, lactic acid normal 16.5 K.  UA is positive.  Recent urine culture was susceptible to ceftriaxone.She is admitted for further evaluation.  Assessment & Plan:   Principal Problem:   Recurrent UTI  Generalized weakness secondary to Recurrent UTI: Patient presented with generalized weakness, increased frequency of urine, leukocytosis. Doubt stercoral colitis.  She also has erythematous area under her left breast and left thigh.  Do not see any signs of abscess in her wounds.  CT abdomen/ Pelvis showed findings concerning for pyelonephritis. Continue empiric antibiotics IV ceftriaxone. Follow-up urine culture.  Intertrigo: Wound care consulted.  Recommended fluconazole.  Confusion:  This could be secondary to UTI. CT head completed report is pending Appears back to baseline.   Urinary frequency: Continue empiric ceftriaxone for now.  Follow-up urine cultures. May consider urology consult.  Hold vesicare for now.  Generalized weakness / Deconditioning: Elevated CK levels: Differentials include rhabdo, myositis but doubtful.  PT and OT evaluation  DM II: Poorly controlled. Patient takes Januvia, metformin and glipizide at home. Hold p.o. diabetic medications Continue regular  insulin sliding scale.   Obtain hemoglobin A1c  Essential hypertension Patient used to take ramipril and metoprolol but not taking it recently.  Hyperlipidemia: Continue Crestor  Chronic atrial fibrillation Resume Eliquis after CT head.  Severe obesity Diet and exercise discussed in detail  DVT prophylaxis: Heparin SQ Code Status: Full code. Family Communication: No family at bedside. Disposition Plan:     Status is: Inpatient Remains inpatient appropriate because: Admitted for generalized weakness likely secondary to UTI.  PT and OT evaluation pending    Consultants:  None  Procedures: None  Antimicrobials:  Anti-infectives (From admission, onward)    Start     Dose/Rate Route Frequency Ordered Stop   08/02/22 0900  fluconazole (DIFLUCAN) IVPB 200 mg        200 mg 100 mL/hr over 60 Minutes Intravenous Every 24 hours 08/02/22 0811     08/01/22 2030  cefTRIAXone (ROCEPHIN) 1 g in sodium chloride 0.9 % 100 mL IVPB        1 g 200 mL/hr over 30 Minutes Intravenous Every 24 hours 08/01/22 2030     08/01/22 1915  fluconazole (DIFLUCAN) tablet 200 mg        200 mg Oral  Once 08/01/22 1902 08/01/22 2024      Subjective: Patient was seen and examined at bedside.  Overnight events noted.   She reports doing better,  still reports feeling very weak and tired.  Objective: Vitals:   08/02/22 0430 08/02/22 0500 08/02/22 0620 08/02/22 1127  BP:   137/89 (!) 155/111  Pulse:   (!) 102 93  Resp:   18 19  Temp:   99 F (37.2 C)  98.5 F (36.9 C)  TempSrc:   Oral Oral  SpO2:   95% 96%  Weight: 115.1 kg 116.5 kg    Height:        Intake/Output Summary (Last 24 hours) at 08/02/2022 1155 Last data filed at 08/02/2022 0913 Gross per 24 hour  Intake 1605.34 ml  Output 1500 ml  Net 105.34 ml   Filed Weights   08/01/22 1426 08/02/22 0430 08/02/22 0500  Weight: 127 kg 115.1 kg 116.5 kg    Examination:  General exam: Appears calm and comfortable, deconditioned, not in  any acute distress Respiratory system: Clear to auscultation. Respiratory effort normal.  RR 15 Cardiovascular system: S1 & S2 heard, regular rate and rhythm, no murmur.  No pedal edema. Gastrointestinal system: Abdomen is soft, nontender, nondistended, bowel sounds present Central nervous system: Alert and oriented x 3. No focal neurological deficits. Extremities: No edema, No cyanosis, no clubbing Skin: Erythematous areas noted under left breast, left thigh area Psychiatry: Judgement and insight appear normal. Mood & affect appropriate.     Data Reviewed: I have personally reviewed following labs and imaging studies  CBC: Recent Labs  Lab 07/29/22 1705 08/01/22 1600 08/02/22 0510  WBC 13.1* 16.5* 9.8  NEUTROABS 10.9* 14.2*  --   HGB 15.6* 17.0* 15.0  HCT 48.8* 53.5* 46.2*  MCV 93.7 95.9 92.4  PLT 260 260 247   Basic Metabolic Panel: Recent Labs  Lab 07/29/22 1705 08/01/22 1804 08/01/22 2303 08/02/22 0510  NA 137 138  --  139  K 4.2 4.0  --  3.5  CL 100 101  --  105  CO2 24 26  --  24  GLUCOSE 199* 161*  --  160*  BUN 12 12  --  8  CREATININE 0.78 0.66  --  0.60  CALCIUM 9.0 8.9  --  8.4*  MG  --   --  1.7  --   PHOS  --   --  2.8  --    GFR: Estimated Creatinine Clearance: 70 mL/min (by C-G formula based on SCr of 0.6 mg/dL). Liver Function Tests: Recent Labs  Lab 07/29/22 1705 08/01/22 1804 08/02/22 0510  AST 23 50* 33  ALT 17 25 23   ALKPHOS 101 93 72  BILITOT 0.7 1.1 0.9  PROT 7.1 7.3 5.9*  ALBUMIN 3.9 3.7 3.0*   No results for input(s): "LIPASE", "AMYLASE" in the last 168 hours. No results for input(s): "AMMONIA" in the last 168 hours. Coagulation Profile: No results for input(s): "INR", "PROTIME" in the last 168 hours. Cardiac Enzymes: Recent Labs  Lab 07/29/22 1705 08/01/22 1804 08/02/22 0510  CKTOTAL 196 1,562* 648*   BNP (last 3 results) No results for input(s): "PROBNP" in the last 8760 hours. HbA1C: No results for input(s): "HGBA1C"  in the last 72 hours. CBG: Recent Labs  Lab 08/01/22 1532 08/02/22 0003 08/02/22 0746 08/02/22 1123  GLUCAP 210* 132* 168* 151*   Lipid Profile: No results for input(s): "CHOL", "HDL", "LDLCALC", "TRIG", "CHOLHDL", "LDLDIRECT" in the last 72 hours. Thyroid Function Tests: Recent Labs    08/01/22 2303  TSH 1.035   Anemia Panel: No results for input(s): "VITAMINB12", "FOLATE", "FERRITIN", "TIBC", "IRON", "RETICCTPCT" in the last 72 hours. Sepsis Labs: Recent Labs  Lab 08/01/22 1700  LATICACIDVEN <0.3*    Recent Results (from the past 240 hour(s))  Resp panel by RT-PCR (RSV, Flu A&B, Covid) Anterior Nasal Swab     Status: None   Collection Time: 07/29/22  5:05 PM  Specimen: Anterior Nasal Swab  Result Value Ref Range Status   SARS Coronavirus 2 by RT PCR NEGATIVE NEGATIVE Final    Comment: (NOTE) SARS-CoV-2 target nucleic acids are NOT DETECTED.  The SARS-CoV-2 RNA is generally detectable in upper respiratory specimens during the acute phase of infection. The lowest concentration of SARS-CoV-2 viral copies this assay can detect is 138 copies/mL. A negative result does not preclude SARS-Cov-2 infection and should not be used as the sole basis for treatment or other patient management decisions. A negative result may occur with  improper specimen collection/handling, submission of specimen other than nasopharyngeal swab, presence of viral mutation(s) within the areas targeted by this assay, and inadequate number of viral copies(<138 copies/mL). A negative result must be combined with clinical observations, patient history, and epidemiological information. The expected result is Negative.  Fact Sheet for Patients:  BloggerCourse.com  Fact Sheet for Healthcare Providers:  SeriousBroker.it  This test is no t yet approved or cleared by the [place] FDA and  has been authorized for detection and/or diagnosis of  SARS-CoV-2 by FDA under an Emergency Use Authorization (EUA). This EUA will remain  in effect (meaning this test can be used) for the duration of the COVID-19 declaration under Section 564(b)(1) of the Act, 21 U.S.C.section 360bbb-3(b)(1), unless the authorization is terminated  or revoked sooner.       Influenza A by PCR NEGATIVE NEGATIVE Final   Influenza B by PCR NEGATIVE NEGATIVE Final    Comment: (NOTE) The Xpert Xpress SARS-CoV-2/FLU/RSV plus assay is intended as an aid in the diagnosis of influenza from Nasopharyngeal swab specimens and should not be used as a sole basis for treatment. Nasal washings and aspirates are unacceptable for Xpert Xpress SARS-CoV-2/FLU/RSV testing.  Fact Sheet for Patients: BloggerCourse.com  Fact Sheet for Healthcare Providers: SeriousBroker.it  This test is not yet approved or cleared by the [place] FDA and has been authorized for detection and/or diagnosis of SARS-CoV-2 by FDA under an Emergency Use Authorization (EUA). This EUA will remain in effect (meaning this test can be used) for the duration of the COVID-19 declaration under Section 564(b)(1) of the Act, 21 U.S.C. section 360bbb-3(b)(1), unless the authorization is terminated or revoked.     Resp Syncytial Virus by PCR NEGATIVE NEGATIVE Final    Comment: (NOTE) Fact Sheet for Patients: BloggerCourse.com  Fact Sheet for Healthcare Providers: SeriousBroker.it  This test is not yet approved or cleared by the [place] FDA and has been authorized for detection and/or diagnosis of SARS-CoV-2 by FDA under an Emergency Use Authorization (EUA). This EUA will remain in effect (meaning this test can be used) for the duration of the COVID-19 declaration under Section 564(b)(1) of the Act, 21 U.S.C. section 360bbb-3(b)(1), unless the authorization is terminated  or revoked.  Performed at [facility], [address], [place], [place] [address]   SARS Coronavirus 2 by RT PCR (hospital order, performed in [facility] hospital lab) *cepheid single result test* Anterior Nasal Swab     Status: None   Collection Time: 08/01/22  7:08 PM   Specimen: Anterior Nasal Swab  Result Value Ref Range Status   SARS Coronavirus 2 by RT PCR NEGATIVE NEGATIVE Final    Comment: (NOTE) SARS-CoV-2 target nucleic acids are NOT DETECTED.  The SARS-CoV-2 RNA is generally detectable in upper and lower respiratory specimens during the acute phase of infection. The lowest concentration of SARS-CoV-2 viral copies this assay can detect is 250 copies / mL. A  negative result does not preclude SARS-CoV-2 infection and should not be used as the sole basis for treatment or other patient management decisions.  A negative result may occur with improper specimen collection / handling, submission of specimen other than nasopharyngeal swab, presence of viral mutation(s) within the areas targeted by this assay, and inadequate number of viral copies (<250 copies / mL). A negative result must be combined with clinical observations, patient history, and epidemiological information.  Fact Sheet for Patients:   RoadLapTop.co.za  Fact Sheet for Healthcare Providers: http://kim-miller.com/  This test is not yet approved or  cleared by the [place] FDA and has been authorized for detection and/or diagnosis of SARS-CoV-2 by FDA under an Emergency Use Authorization (EUA).  This EUA will remain in effect (meaning this test can be used) for the duration of the COVID-19 declaration under Section 564(b)(1) of the Act, 21 U.S.C. section 360bbb-3(b)(1), unless the authorization is terminated or revoked sooner.  Performed at [facility], [address], [place], [place] [address]     Radiology  Studies: CT ABDOMEN PELVIS W WO CONTRAST  Result Date: 08/01/2022 CLINICAL DATA:  Concern for urinary outlet obstruction, prolonged constipation and leukocytosis. EXAM: CT ABDOMEN AND PELVIS WITHOUT AND WITH CONTRAST TECHNIQUE: Multidetector CT imaging of the abdomen and pelvis was performed following the standard protocol before and following the bolus administration of intravenous contrast. RADIATION DOSE REDUCTION: This exam was performed according to the departmental dose-optimization program which includes automated exposure control, adjustment of the mA and/or kV according to patient size and/or use of iterative reconstruction technique. CONTRAST:  OMNIPAQUE IOHEXOL 300 MG/ML  SOLN COMPARISON:  None Available. FINDINGS: Lower chest: Mild atelectasis is noted at the lung bases. Hepatobiliary: No focal abnormality in the liver. Fatty infiltration of the liver is noted. No biliary ductal dilatation. Hyperdense material is present in the gallbladder, possible stones or sludge. Pancreas: Unremarkable. No pancreatic ductal dilatation or surrounding inflammatory changes. Spleen: Normal in size without focal abnormality. Adrenals/Urinary Tract: The adrenal glands are within normal limits. Perinephric edema is noted bilaterally. There is a cyst in the upper pole of the right kidney. A nonobstructive calculus is noted on the right. Patchy hypoenhancement is noted in the upper and lower poles of the left kidney. No ureteral calculus or obstructive uropathy bilaterally. Bladder is unremarkable. Stomach/Bowel: There is a small hiatal hernia. Stomach is within normal limits. Appendix appears normal. No evidence of bowel wall thickening, distention, or inflammatory changes. No free air or pneumatosis. Few scattered diverticula are present along the colon without evidence of diverticulitis. Vascular/Lymphatic: Aortic atherosclerosis. Nonspecific prominent lymph nodes are present at the porta hepatis measuring up to  1.2 cm. Prominent lymph nodes are noted in the left periaortic space at the level of the left kidney measuring 1 cm. Reproductive: Status post hysterectomy. There is a cyst in the right adnexa measuring 2.9 cm. A cyst is present in the left adnexa measuring 1.9 cm. No follow-up imaging is recommended. Other: No abdominopelvic ascites. A small fat containing umbilical hernia is present. Musculoskeletal: Degenerative changes are present in the thoracolumbar spine. No acute or suspicious osseous abnormality. IMPRESSION: 1. Patchy hypoenhancement of the left kidney, concerning for pyelonephritis. 2. Nonobstructive right renal calculus. 3. Hepatic steatosis. 4. Hyperdense material in the gallbladder, possible stones or sludge. 5. Small hiatal hernia. 6. Aortic atherosclerosis. 7. Simple cysts in the ovaries bilaterally, the largest measuring 2.9 cm, almost certainly benign. Yearly follow-up with ultrasound is recommended until resolved. Electronically  Signed   By: Thornell Sartorius M.D.   On: 08/01/2022 21:44   DG Chest Portable 1 View  Result Date: 08/01/2022 CLINICAL DATA:  Altered mental status. Recent urinary tract infection. EXAM: PORTABLE CHEST 1 VIEW COMPARISON:  One-view chest x-ray 07/29/2022 FINDINGS: The heart is enlarged. Mild pulmonary vascular congestion present. Lung volumes are low. No focal airspace consolidation present. No edema or effusion is present IMPRESSION: Cardiomegaly and mild pulmonary vascular congestion. Electronically Signed   By: Marin Roberts M.D.   On: 08/01/2022 16:28    Scheduled Meds:  heparin  5,000 Units Subcutaneous Q8H   insulin aspart  0-9 Units Subcutaneous TID WC   multivitamin with minerals  1 tablet Oral Daily   Continuous Infusions:  cefTRIAXone (ROCEPHIN)  IV 1 g (08/02/22 0037)   fluconazole (DIFLUCAN) IV 200 mg (08/02/22 0909)     LOS: 0 days    Time spent: 50 mins    Willeen Niece, MD [facility]   If 7PM-7AM, please contact  night-coverage

## 2022-08-02 NOTE — Plan of Care (Signed)
  Problem: Health Behavior/Discharge Planning: Goal: Ability to manage health-related needs will improve Outcome: Progressing   Problem: Clinical Measurements: Goal: Ability to maintain clinical measurements within normal limits will improve Outcome: Progressing Goal: Respiratory complications will improve Outcome: Progressing   Problem: Activity: Goal: Risk for activity intolerance will decrease Outcome: Progressing   Problem: Coping: Goal: Level of anxiety will decrease Outcome: Progressing

## 2022-08-03 ENCOUNTER — Telehealth: Payer: Self-pay

## 2022-08-03 ENCOUNTER — Ambulatory Visit: Payer: Self-pay

## 2022-08-03 DIAGNOSIS — N390 Urinary tract infection, site not specified: Secondary | ICD-10-CM | POA: Diagnosis not present

## 2022-08-03 LAB — GLUCOSE, CAPILLARY
Glucose-Capillary: 119 mg/dL — ABNORMAL HIGH (ref 70–99)
Glucose-Capillary: 194 mg/dL — ABNORMAL HIGH (ref 70–99)
Glucose-Capillary: 182 mg/dL — ABNORMAL HIGH (ref 70–99)
Glucose-Capillary: 155 mg/dL — ABNORMAL HIGH (ref 70–99)

## 2022-08-03 MED ORDER — HYDRALAZINE HCL 25 MG PO TABS
25.0000 mg | ORAL_TABLET | Freq: Three times a day (TID) | ORAL | Status: DC
  Administered 2022-08-03 – 2022-08-06 (×10): 25 mg via ORAL
  Filled 2022-08-03 (×10): qty 1

## 2022-08-03 MED ORDER — METOPROLOL SUCCINATE ER 50 MG PO TB24
50.0000 mg | ORAL_TABLET | Freq: Every day | ORAL | Status: DC
  Administered 2022-08-03 – 2022-08-06 (×4): 50 mg via ORAL
  Filled 2022-08-03 (×4): qty 1

## 2022-08-03 NOTE — Progress Notes (Signed)
Physical Therapy Treatment Patient Details Name: Ebony Ashley MRN: [id number] DOB: [birth year] Today's Date: 08/03/2022   History of Present Illness Pt is 77 yo female admitted on 08/01/22 with generalized weakness secondary to recurrent UTI. Pt with hx including but not limited to DM, HTN, afib, and frequent UTI    PT Comments  Pt making gradual progress but does still need assist at times for transfers, and fatigues easily, not yet ambulating household distances.  She ambulated 105' and 18' with rest break.  Needs min guard to min A with cues for sit to stands.  Do continue to recommend inpatient post acute therapy <3 hr at d/c.     If plan is discharge home, recommend the following: A little help with walking and/or transfers;A little help with bathing/dressing/bathroom;Help with stairs or ramp for entrance;Assistance with cooking/housework   Can travel by private vehicle     Yes  Equipment Recommendations  BSC/3in1 (or elevated toilet seat)    Recommendations for Other Services       Precautions / Restrictions Precautions Precautions: Fall     Mobility  Bed Mobility Overal bed mobility: Needs Assistance Bed Mobility: Supine to Sit     Supine to sit: HOB elevated, Mod assist     General bed mobility comments: increased time ; also increased assist to scoot forward today as pt on dermatherapy sheets yesterday and normal sheets today with increased fricition to overcome    Transfers Overall transfer level: Needs assistance Equipment used: Rolling walker (2 wheels) Transfers: Sit to/from Stand Sit to Stand: Min assist, Min guard           General transfer comment: Sit to stand x 5 from bed and x 3 from chair.  Cues to scoot forward, push from seated surface, nose over toes, and use momentum.  Initially requiring min A but progressing to min guard    Ambulation/Gait Ambulation/Gait assistance: Min assist Gait Distance (Feet): 15 Feet (15' then 18') Assistive  device: Rolling walker (2 wheels) Gait Pattern/deviations: Step-to pattern, Decreased stride length, Shuffle Gait velocity: decreased     General Gait Details: Fatigued easily with legs getting shakey requiring seated rest breaks   Stairs             Wheelchair Mobility     Tilt Bed    Modified Rankin (Stroke Patients Only)       Balance Overall balance assessment: Needs assistance Sitting-balance support: No upper extremity supported Sitting balance-Leahy Scale: Good     Standing balance support: Bilateral upper extremity supported, Reliant on assistive device for balance Standing balance-Leahy Scale: Poor                              Cognition Arousal/Alertness: Awake/alert Behavior During Therapy: WFL for tasks assessed/performed Overall Cognitive Status: No family/caregiver present to determine baseline cognitive functioning                                 General Comments: Overall WFL- follows commands, A&O x 4, good safety awareness.  Did need repetition in regards to therapy recommendations and transfer techniqes        Exercises      General Comments General comments (skin integrity, edema, etc.): Educated pt on PT recommendations for post acute rehab due to living alone, fall risk, not ambulating household distances, and still needs assist.  Pt agrees  that she would not feel safe at home.      Pertinent Vitals/Pain Pain Assessment Pain Assessment: No/denies pain    Home Living                          Prior Function            PT Goals (current goals can now be found in the care plan section) Progress towards PT goals: Progressing toward goals    Frequency    Min 1X/week      PT Plan Current plan remains appropriate    Co-evaluation              AM-PAC PT "6 Clicks" Mobility   Outcome Measure  Help needed turning from your back to your side while in a flat bed without using bedrails?:  A Little Help needed moving from lying on your back to sitting on the side of a flat bed without using bedrails?: A Little Help needed moving to and from a bed to a chair (including a wheelchair)?: A Little Help needed standing up from a chair using your arms (e.g., wheelchair or bedside chair)?: A Little Help needed to walk in hospital room?: Total Help needed climbing 3-5 steps with a railing? : Total 6 Click Score: 14    End of Session Equipment Utilized During Treatment: Gait belt Activity Tolerance: Patient tolerated treatment well Patient left: in chair;with call bell/phone within reach;with chair alarm set Nurse Communication: Mobility status PT Visit Diagnosis: Other abnormalities of gait and mobility (R26.89);Muscle weakness (generalized) (M62.81)     Time: [phone] (some time out of room to retrieve rollator , only 2 units) PT Time Calculation (min) (ACUTE ONLY): 39 min  Charges:    $Gait Training: 8-22 mins $Therapeutic Activity: 8-22 mins PT General Charges $$ ACUTE PT VISIT: 1 Visit                     Anise Salvo, PT Acute Rehab Services [facility] [phone]    Rayetta Humphrey 08/03/2022, 12:33 PM

## 2022-08-03 NOTE — [place] FL2 (Signed)
[place] MEDICAID FL2 LEVEL OF CARE FORM     IDENTIFICATION  Patient Name: Ebony Ashley Birthdate: 07-11-1945 Sex: female Admission Date (Current Location): 08/01/2022  [facility] and [place] Number:  Producer, television/film/video and Address:  [facility],  [address] [place], [place] [address]      Provider Number: [id number]  Attending Physician Name and Address:  Willeen Niece, MD  Relative Name and Phone Number:  son, Brenlynn Bin [phone]    Current Level of Care: Hospital Recommended Level of Care: Skilled Nursing Facility Prior Approval Number:    Date Approved/Denied:   PASRR Number: [id number]  Discharge Plan: SNF    Current Diagnoses: Patient Active Problem List   Diagnosis Date Noted   Recurrent UTI 08/01/2022   Hyperlipidemia associated with type 2 diabetes mellitus (HCC) 07/27/2020   Anticoagulated 08/08/2019   Mild pulmonary hypertension (HCC) 08/08/2019   Atrial fibrillation (HCC) 05/07/2019   LVH (left ventricular hypertrophy) 05/07/2019   Educated about COVID-19 virus infection 05/07/2019   Urinary incontinence 09/10/2011   Metabolic syndrome 09/10/2011   Murmur 03/01/2011   Morbid obesity (HCC) 03/01/2011   Hypertension 11/04/2010   Non-insulin dependent type 2 diabetes mellitus (HCC) 11/04/2010   Esophagitis 11/04/2010   Esophageal spasm 11/04/2010   Dyspnea 11/04/2010    Orientation RESPIRATION BLADDER Height & Weight     Self, Time  Normal Incontinent, External catheter (currently with purewick) Weight: 256 lb 13.4 oz (116.5 kg) Height:  5\' 1"  (154.9 cm)  BEHAVIORAL SYMPTOMS/MOOD NEUROLOGICAL BOWEL NUTRITION STATUS      Continent Diet (regular)  AMBULATORY STATUS COMMUNICATION OF NEEDS Skin   Limited Assist Verbally Other (Comment) (erythema breast/ buttocks/ groin)                       Personal Care Assistance Level of Assistance  Bathing, Feeding, Dressing Bathing Assistance: Limited assistance Feeding  assistance: Limited assistance Dressing Assistance: Limited assistance     Functional Limitations Info  Sight, Hearing, Speech Sight Info: Adequate Hearing Info: Adequate Speech Info: Adequate    SPECIAL CARE FACTORS FREQUENCY  PT (By licensed PT), OT (By licensed OT)     PT Frequency: 5x/wk OT Frequency: 5x/wk            Contractures Contractures Info: Not present    Additional Factors Info  Code Status, Allergies, Insulin Sliding Scale Code Status Info: Full Allergies Info: Adhesive (Tape), Polysporin (Bacitracin-polymyxin B)   Insulin Sliding Scale Info: see MAR       Current Medications (08/03/2022):  This is the current hospital active medication list Current Facility-Administered Medications  Medication Dose Route Frequency Provider Last Rate Last Admin   acetaminophen (TYLENOL) tablet 650 mg  650 mg Oral Q6H PRN Charlane Ferretti, DO   650 mg at 08/02/22 2100   Or   acetaminophen (TYLENOL) suppository 650 mg  650 mg Rectal Q6H PRN Charlane Ferretti, DO       apixaban (ELIQUIS) tablet 5 mg  5 mg Oral BID Willeen Niece, MD   5 mg at 08/03/22 0855   bisacodyl (DULCOLAX) suppository 10 mg  10 mg Rectal Daily PRN Charlane Ferretti, DO       cefTRIAXone (ROCEPHIN) 1 g in sodium chloride 0.9 % 100 mL IVPB  1 g Intravenous Q24H Charlane Ferretti, DO   Stopped at 08/02/22 2126   fluconazole (DIFLUCAN) IVPB 200 mg  200 mg Intravenous Q24H Willeen Niece, MD 100 mL/hr at 08/03/22 0921 200 mg at 08/03/22  4010   hydrALAZINE (APRESOLINE) injection 10 mg  10 mg Intravenous Q6H PRN Willeen Niece, MD       hydrALAZINE (APRESOLINE) tablet 25 mg  25 mg Oral Q8H Khatri, Pardeep, MD   25 mg at 08/03/22 0855   insulin aspart (novoLOG) injection 0-9 Units  0-9 Units Subcutaneous TID WC Charlane Ferretti, DO   2 Units at 08/02/22 1809   multivitamin with minerals tablet 1 tablet  1 tablet Oral Daily Charlane Ferretti, DO   1 tablet at 08/03/22 0855   ondansetron (ZOFRAN) tablet 4 mg  4 mg Oral Q6H PRN  Charlane Ferretti, DO       Or   ondansetron [facility]) injection 4 mg  4 mg Intravenous Q6H PRN Charlane Ferretti, DO       polyethylene glycol (MIRALAX / GLYCOLAX) packet 17 g  17 g Oral BID PRN Charlane Ferretti, DO         Discharge Medications: Please see discharge summary for a list of discharge medications.  Relevant Imaging Results:  Relevant Lab Results:   Additional Information SS# [id number]  Amada Jupiter, LCSW

## 2022-08-03 NOTE — Telephone Encounter (Signed)
Pharmacy Patient Advocate Encounter  Received notification from [employer]/ [employer] that Prior Authorization for Dexcom G7 Sensor has been DENIED. Please advise how you'd like to proceed. Full denial letter will be uploaded to the media tab. See denial reason below.  PA #/Case ID/Reference #: [id number]

## 2022-08-03 NOTE — Plan of Care (Signed)
  Problem: Education: Goal: Ability to describe self-care measures that may prevent or decrease complications (Diabetes Survival Skills Education) will improve 08/03/2022 1905 by Delila Spence, LPN Outcome: Progressing 08/03/2022 1904 by Delila Spence, LPN Outcome: Progressing Goal: Individualized Educational Video(s) 08/03/2022 1905 by Delila Spence, LPN Outcome: Progressing 08/03/2022 1904 by Delila Spence, LPN Outcome: Progressing   Problem: Coping: Goal: Ability to adjust to condition or change in health will improve 08/03/2022 1905 by Delila Spence, LPN Outcome: Progressing 08/03/2022 1904 by Delila Spence, LPN Outcome: Progressing

## 2022-08-03 NOTE — Patient Outreach (Signed)
  Care Coordination   Case Collaboration Visit Note   08/03/2022 Name: Ebony Ashley MRN: [id number] DOB: 1945-02-10  Ebony Ashley is a 77 y.o. year old female who sees Baxley, Luanna Cole, MD for primary care. I collaborated with PCP office regarding patient's inpatient admission.   What matters to the patients health and wellness today?  N/a    Goals Addressed             This Visit's Progress    RN Care Coordination Activities: further follow up needed       Care Coordination Interventions: Received PCP update advising patient was admitted into the hospital  Reviewed chart, noted patient was admitted to [facility] on 08/01/22; dx: recurrent UTI with PT recommendation for discharge to skilled nursing facility    Interventions Today    Flowsheet Row Most Recent Value  General Interventions   General Interventions Discussed/Reviewed Communication with  Communication with RN  [PCP/Wanda Booth]          SDOH assessments and interventions completed:  No     Care Coordination Interventions:  Yes, provided   Follow up plan: Follow up call scheduled for upon patient's discharge home     Encounter Outcome:  Pt. Visit Completed

## 2022-08-03 NOTE — TOC Initial Note (Addendum)
Transition of Care [facility]) - Initial/Assessment Note    Patient Details  Name: Ebony Ashley MRN: [id number] Date of Birth: 09/05/1945  Transition of Care [facility]) CM/SW Contact:    Amada Jupiter, LCSW Phone Number: 08/03/2022, 11:54 AM  Clinical Narrative:                 Met with pt this morning and have spoken with son regarding therapy recommendations for SNF rehab.  Both confirm that pt does live alone and son is in [employer].  They are agreed that she is not currently at a functional level to be safe to return home alone and agreeable with plan for SNF/ no facility preferences.  Have explained to both that PT will continue to work with her here while we conduct SNF bed search and can change plan to home dc if needed improvements in function occur.  ADDENDUM: Have started insurance auth with [employer].  Expected Discharge Plan: Skilled Nursing Facility Barriers to Discharge: Continued Medical Work up, English as a second language teacher, SNF Pending bed offer   Patient Goals and CMS Choice Patient states their goals for this hospitalization and ongoing recovery are:: return home if possible          Expected Discharge Plan and Services In-house Referral: Clinical Social Work   Post Acute Care Choice: Skilled Nursing Facility Living arrangements for the past 2 months: Single Family Home                 DME Arranged: N/A DME Agency: NA                  Prior Living Arrangements/Services Living arrangements for the past 2 months: Single Family Home Lives with:: Self Patient language and need for interpreter reviewed:: Yes Do you feel safe going back to the place where you live?: Yes      Need for Family Participation in Patient Care: Yes (Comment) Care giver support system in place?: No (comment)   Criminal Activity/Legal Involvement Pertinent to Current Situation/Hospitalization: No - Comment as needed  Activities of Daily Living Home Assistive Devices/Equipment: Eyeglasses,  Shower chair with back, Cane (specify quad or straight), Walker (specify type) ADL Screening (condition at time of admission) Patient's cognitive ability adequate to safely complete daily activities?: Yes Is the patient deaf or have difficulty hearing?: No Does the patient have difficulty seeing, even when wearing glasses/contacts?: No Does the patient have difficulty concentrating, remembering, or making decisions?: No Patient able to express need for assistance with ADLs?: No Does the patient have difficulty dressing or bathing?: No Independently performs ADLs?: Yes (appropriate for developmental age) Does the patient have difficulty walking or climbing stairs?: No Weakness of Legs: None Weakness of Arms/Hands: None  Permission Sought/Granted Permission sought to share information with : Family Supports Permission granted to share information with : Yes, Verbal Permission Granted  Share Information with NAME: son, Ishia Hanania @ [phone]           Emotional Assessment Appearance:: Appears stated age Attitude/Demeanor/Rapport: Gracious Affect (typically observed): Accepting Orientation: : Oriented to Self, Oriented to Place Alcohol / Substance Use: Not Applicable Psych Involvement: No (comment)  Admission diagnosis:  Dehydration [E86.0] Intertrigo [L30.4] Recurrent UTI [N39.0] Failure to thrive in adult [R62.7] Traumatic rhabdomyolysis, initial encounter (HCC) [T79.6XXA] Patient Active Problem List   Diagnosis Date Noted   Recurrent UTI 08/01/2022   Hyperlipidemia associated with type 2 diabetes mellitus (HCC) 07/27/2020   Anticoagulated 08/08/2019   Mild pulmonary hypertension (HCC) 08/08/2019  Atrial fibrillation (HCC) 05/07/2019   LVH (left ventricular hypertrophy) 05/07/2019   Educated about COVID-19 virus infection 05/07/2019   Urinary incontinence 09/10/2011   Metabolic syndrome 09/10/2011   Murmur 03/01/2011   Morbid obesity (HCC) 03/01/2011   Hypertension  11/04/2010   Non-insulin dependent type 2 diabetes mellitus (HCC) 11/04/2010   Esophagitis 11/04/2010   Esophageal spasm 11/04/2010   Dyspnea 11/04/2010   PCP:  Margaree Mackintosh, MD Pharmacy:   [facility]) [facility] - [place], [place] - [address] [place] [address] Phone: [phone] Fax: [phone]  [id number] - [place], [place] - [address] [place] [address] Phone: [phone] Fax: [phone]     Social Determinants of Health (SDOH) Social History: SDOH Screenings   Food Insecurity: No Food Insecurity (08/01/2022)  Housing: Low Risk  (08/01/2022)  Transportation Needs: No Transportation Needs (08/01/2022)  Utilities: Not At Risk (08/01/2022)  Depression (PHQ2-9): Low Risk  (07/16/2022)  Social Connections: Unknown (05/27/2022)   Received from [facility], [facility]  Tobacco Use: Low Risk  (08/01/2022)   SDOH Interventions:     Readmission Risk Interventions    08/03/2022   11:52 AM  Readmission Risk Prevention Plan  Transportation Screening Complete  PCP or Specialist Appt within 5-7 Days Complete  Home Care Screening Complete  Medication Review (RN CM) Complete

## 2022-08-03 NOTE — Progress Notes (Signed)
PROGRESS NOTE    Ebony Ashley  NWG:[id number] DOB: 03-Jun-1945 DOA: 08/01/2022  PCP: Margaree Mackintosh, MD    Brief Narrative:  This 77 yrs old female with PMH significant for DM, HTN, A.fib on Eliquis, and frequent UTIs, severe obesity who presented to the ED with generalized weakness.  Patient was recently treated 3 times for UTI in last two months. Patient reports not feeling well since she has last UTI, she is getting gradual weakness which has been progressive for loss 2 months. She reports difficulty getting to the bathroom and unable to get up from the commode.  She lives alone,  her son lives in [place] and considering about moving her to his house in [place].  Pertinent labs in the ED CK15 62, lactic acid normal 16.5 K.  UA is positive.  Recent urine culture was susceptible to ceftriaxone.She is admitted for further evaluation.  Assessment & Plan:   Principal Problem:   Recurrent UTI  Generalized weakness secondary to Recurrent UTI: Patient presented with generalized weakness, increased frequency of urine, leukocytosis. Doubt stercoral colitis.  She also has erythematous area under her left breast and left thigh.  Does not see any signs of abscess in her wounds.  CT abdomen/ Pelvis showed findings concerning for pyelonephritis. Continue empiric antibiotics IV ceftriaxone. Urine culture showed multiple species.  Intertrigo: Wound care consulted.  Recommended fluconazole. Erythematous areas/rashes improving.  Confusion:  This could be secondary to UTI. CT head no acute intracranial abnormality noted. Appears back to baseline.   Urinary frequency: Continue empiric ceftriaxone for now.   Urine culture multiple species. May consider urology consult.  Hold vesicare for now.  Generalized weakness / Deconditioning: Elevated CK levels: Differentials include rhabdo, myositis but doubtful.  PT and OT evaluation  DM II: Poorly controlled. Patient takes Januvia, metformin  and glipizide at home. Hold p.o. diabetic medications Continue regular insulin sliding scale.   Hb A1c 8.2  Essential hypertension Patient used to take ramipril and metoprolol but not taking it recently. Started hydralazine 25 mg 3 times daily.  Hyperlipidemia: Continue Crestor  Chronic atrial fibrillation Rate well-controlled.  Continue Eliquis.  Severe obesity Diet and exercise discussed in detail.  DVT prophylaxis: Eliquis Code Status: Full code. Family Communication: No family at bedside. Disposition Plan:     Status is: Inpatient Remains inpatient appropriate because: Admitted for generalized weakness likely secondary to UTI.  PT and OT evaluation pending    Consultants:  None  Procedures: None  Antimicrobials:  Anti-infectives (From admission, onward)    Start     Dose/Rate Route Frequency Ordered Stop   08/02/22 0900  fluconazole (DIFLUCAN) IVPB 200 mg        200 mg 100 mL/hr over 60 Minutes Intravenous Every 24 hours 08/02/22 0811     08/01/22 2030  cefTRIAXone (ROCEPHIN) 1 g in sodium chloride 0.9 % 100 mL IVPB        1 g 200 mL/hr over 30 Minutes Intravenous Every 24 hours 08/01/22 2030     08/01/22 1915  fluconazole (DIFLUCAN) tablet 200 mg        200 mg Oral  Once 08/01/22 1902 08/01/22 2024      Subjective: Patient was seen and examined at bedside.  Overnight events noted.   Patient reports doing much better.  She still reports feeling weak and tired.   Rash under the breasts and in the thigh areas has much improved.  Objective: Vitals:   08/02/22 1127 08/02/22 1213 08/02/22 2029 08/03/22  0013  BP: (!) 155/111 (!) 149/100 (!) 151/88 (!) 147/103  Pulse: 93   91  Resp: 19  17 17   Temp: 98.5 F (36.9 C)  98.2 F (36.8 C) 98.2 F (36.8 C)  TempSrc: Oral  Oral Oral  SpO2: 96%  93% 94%  Weight:      Height:        Intake/Output Summary (Last 24 hours) at 08/03/2022 1113 Last data filed at 08/03/2022 0858 Gross per 24 hour  Intake 680.08 ml   Output 2100 ml  Net -1419.92 ml   Filed Weights   08/01/22 1426 08/02/22 0430 08/02/22 0500  Weight: 127 kg 115.1 kg 116.5 kg    Examination:  General exam: Appears comfortable, deconditioned, not in any acute distress. Respiratory system: CTA bilaterally. Respiratory effort normal.  RR 14 Cardiovascular system: S1 & S2 heard, regular rate and rhythm, no murmur.  No pedal edema. Gastrointestinal system: Abdomen is soft, non tender, non distended, bowel sounds present. Central nervous system: Alert and oriented x 3. No focal neurological deficits. Extremities: No edema, No cyanosis, no clubbing Skin: Erythematous areas noted under left breast, left thigh area > Improving Psychiatry: Judgement and insight appear normal. Mood & affect appropriate.     Data Reviewed: I have personally reviewed following labs and imaging studies  CBC: Recent Labs  Lab 07/29/22 1705 08/01/22 1600 08/02/22 0510  WBC 13.1* 16.5* 9.8  NEUTROABS 10.9* 14.2*  --   HGB 15.6* 17.0* 15.0  HCT 48.8* 53.5* 46.2*  MCV 93.7 95.9 92.4  PLT 260 260 247   Basic Metabolic Panel: Recent Labs  Lab 07/29/22 1705 08/01/22 1804 08/01/22 2303 08/02/22 0510  NA 137 138  --  139  K 4.2 4.0  --  3.5  CL 100 101  --  105  CO2 24 26  --  24  GLUCOSE 199* 161*  --  160*  BUN 12 12  --  8  CREATININE 0.78 0.66  --  0.60  CALCIUM 9.0 8.9  --  8.4*  MG  --   --  1.7  --   PHOS  --   --  2.8  --    GFR: Estimated Creatinine Clearance: 70 mL/min (by C-G formula based on SCr of 0.6 mg/dL). Liver Function Tests: Recent Labs  Lab 07/29/22 1705 08/01/22 1804 08/02/22 0510  AST 23 50* 33  ALT 17 25 23   ALKPHOS 101 93 72  BILITOT 0.7 1.1 0.9  PROT 7.1 7.3 5.9*  ALBUMIN 3.9 3.7 3.0*   No results for input(s): "LIPASE", "AMYLASE" in the last 168 hours. No results for input(s): "AMMONIA" in the last 168 hours. Coagulation Profile: No results for input(s): "INR", "PROTIME" in the last 168 hours. Cardiac  Enzymes: Recent Labs  Lab 07/29/22 1705 08/01/22 1804 08/02/22 0510  CKTOTAL 196 1,562* 648*   BNP (last 3 results) No results for input(s): "PROBNP" in the last 8760 hours. HbA1C: Recent Labs    08/01/22 2303  HGBA1C 8.2*   CBG: Recent Labs  Lab 08/02/22 0746 08/02/22 1123 08/02/22 1653 08/02/22 2030 08/03/22 0838  GLUCAP 168* 151* 174* 175* 119*   Lipid Profile: No results for input(s): "CHOL", "HDL", "LDLCALC", "TRIG", "CHOLHDL", "LDLDIRECT" in the last 72 hours. Thyroid Function Tests: Recent Labs    08/01/22 2303  TSH 1.035   Anemia Panel: No results for input(s): "VITAMINB12", "FOLATE", "FERRITIN", "TIBC", "IRON", "RETICCTPCT" in the last 72 hours. Sepsis Labs: Recent Labs  Lab 08/01/22 1700  LATICACIDVEN <  0.3*    Recent Results (from the past 240 hour(s))  Resp panel by RT-PCR (RSV, Flu A&B, Covid) Anterior Nasal Swab     Status: None   Collection Time: 07/29/22  5:05 PM   Specimen: Anterior Nasal Swab  Result Value Ref Range Status   SARS Coronavirus 2 by RT PCR NEGATIVE NEGATIVE Final    Comment: (NOTE) SARS-CoV-2 target nucleic acids are NOT DETECTED.  The SARS-CoV-2 RNA is generally detectable in upper respiratory specimens during the acute phase of infection. The lowest concentration of SARS-CoV-2 viral copies this assay can detect is 138 copies/mL. A negative result does not preclude SARS-Cov-2 infection and should not be used as the sole basis for treatment or other patient management decisions. A negative result may occur with  improper specimen collection/handling, submission of specimen other than nasopharyngeal swab, presence of viral mutation(s) within the areas targeted by this assay, and inadequate number of viral copies(<138 copies/mL). A negative result must be combined with clinical observations, patient history, and epidemiological information. The expected result is Negative.  Fact Sheet for Patients:   BloggerCourse.com  Fact Sheet for Healthcare Providers:  SeriousBroker.it  This test is no t yet approved or cleared by the [place] FDA and  has been authorized for detection and/or diagnosis of SARS-CoV-2 by FDA under an Emergency Use Authorization (EUA). This EUA will remain  in effect (meaning this test can be used) for the duration of the COVID-19 declaration under Section 564(b)(1) of the Act, 21 U.S.C.section 360bbb-3(b)(1), unless the authorization is terminated  or revoked sooner.       Influenza A by PCR NEGATIVE NEGATIVE Final   Influenza B by PCR NEGATIVE NEGATIVE Final    Comment: (NOTE) The Xpert Xpress SARS-CoV-2/FLU/RSV plus assay is intended as an aid in the diagnosis of influenza from Nasopharyngeal swab specimens and should not be used as a sole basis for treatment. Nasal washings and aspirates are unacceptable for Xpert Xpress SARS-CoV-2/FLU/RSV testing.  Fact Sheet for Patients: BloggerCourse.com  Fact Sheet for Healthcare Providers: SeriousBroker.it  This test is not yet approved or cleared by the [place] FDA and has been authorized for detection and/or diagnosis of SARS-CoV-2 by FDA under an Emergency Use Authorization (EUA). This EUA will remain in effect (meaning this test can be used) for the duration of the COVID-19 declaration under Section 564(b)(1) of the Act, 21 U.S.C. section 360bbb-3(b)(1), unless the authorization is terminated or revoked.     Resp Syncytial Virus by PCR NEGATIVE NEGATIVE Final    Comment: (NOTE) Fact Sheet for Patients: BloggerCourse.com  Fact Sheet for Healthcare Providers: SeriousBroker.it  This test is not yet approved or cleared by the [place] FDA and has been authorized for detection and/or diagnosis of SARS-CoV-2 by FDA under an Emergency Use  Authorization (EUA). This EUA will remain in effect (meaning this test can be used) for the duration of the COVID-19 declaration under Section 564(b)(1) of the Act, 21 U.S.C. section 360bbb-3(b)(1), unless the authorization is terminated or revoked.  Performed at [facility], [address], [place], [place] [address]   Culture, blood (routine x 2)     Status: None (Preliminary result)   Collection Time: 08/01/22  4:00 PM   Specimen: BLOOD RIGHT HAND  Result Value Ref Range Status   Specimen Description   Final    BLOOD RIGHT HAND Performed at [facility], [address] [place], [place] [address]    Special Requests   Final  BOTTLES DRAWN AEROBIC AND ANAEROBIC Blood Culture adequate volume Performed at [facility] [address], [place], [place] [address]    Culture   Final    NO GROWTH 2 DAYS Performed at [facility], [address], [place], [place] [address]    Report Status PENDING  Incomplete  Culture, blood (routine x 2)     Status: None (Preliminary result)   Collection Time: 08/01/22  4:41 PM   Specimen: BLOOD RIGHT ARM  Result Value Ref Range Status   Specimen Description   Final    BLOOD RIGHT ARM Performed at [facility], [address], [place], [place] [address]    Special Requests   Final    BOTTLES DRAWN AEROBIC AND ANAEROBIC Blood Culture results may not be optimal due to an excessive volume of blood received in culture bottles Performed at [facility], [address] [place], [place] [address]    Culture   Final    NO GROWTH 2 DAYS Performed at [facility], [address] [place], [place] [address]    Report Status PENDING  Incomplete  SARS Coronavirus 2 by RT PCR (hospital order, performed in [facility] hospital lab) *cepheid single result test* Anterior Nasal Swab     Status: None   Collection Time: 08/01/22  7:08 PM   Specimen:  Anterior Nasal Swab  Result Value Ref Range Status   SARS Coronavirus 2 by RT PCR NEGATIVE NEGATIVE Final    Comment: (NOTE) SARS-CoV-2 target nucleic acids are NOT DETECTED.  The SARS-CoV-2 RNA is generally detectable in upper and lower respiratory specimens during the acute phase of infection. The lowest concentration of SARS-CoV-2 viral copies this assay can detect is 250 copies / mL. A negative result does not preclude SARS-CoV-2 infection and should not be used as the sole basis for treatment or other patient management decisions.  A negative result may occur with improper specimen collection / handling, submission of specimen other than nasopharyngeal swab, presence of viral mutation(s) within the areas targeted by this assay, and inadequate number of viral copies (<250 copies / mL). A negative result must be combined with clinical observations, patient history, and epidemiological information.  Fact Sheet for Patients:   RoadLapTop.co.za  Fact Sheet for Healthcare Providers: http://kim-miller.com/  This test is not yet approved or  cleared by the [place] FDA and has been authorized for detection and/or diagnosis of SARS-CoV-2 by FDA under an Emergency Use Authorization (EUA).  This EUA will remain in effect (meaning this test can be used) for the duration of the COVID-19 declaration under Section 564(b)(1) of the Act, 21 U.S.C. section 360bbb-3(b)(1), unless the authorization is terminated or revoked sooner.  Performed at [facility], [address], [place], [place] [address]   Urine Culture (for pregnant, neutropenic or urologic patients or patients with an indwelling urinary catheter)     Status: Abnormal   Collection Time: 08/01/22  9:57 PM   Specimen: Urine, Clean Catch  Result Value Ref Range Status   Specimen Description   Final    URINE, CLEAN CATCH Performed at [facility], [address], [place], [place] [address]    Special Requests   Final    NONE Performed at [facility], [address], [place], [place] [address]    Culture MULTIPLE SPECIES PRESENT, SUGGEST RECOLLECTION (A)  Final   Report Status 08/03/2022 FINAL  Final  Radiology Studies: CT HEAD WO CONTRAST ( )  Result Date: 08/02/2022 CLINICAL DATA:  Encephalopathy, fall 2 weeks ago, knot on back of head. EXAM: CT HEAD WITHOUT CONTRAST TECHNIQUE: Contiguous axial images were obtained from the base of the skull through the vertex without intravenous contrast. RADIATION DOSE REDUCTION: This exam was performed according to the departmental dose-optimization program which includes automated exposure control, adjustment of the mA and/or kV according to patient size and/or use of iterative reconstruction technique. COMPARISON:  CT dated 06/22/2022. FINDINGS: Brain: Generalized age related parenchymal volume loss with commensurate dilatation of the ventricles and sulci. Chronic small vessel ischemic changes within the bilateral periventricular and subcortical white matter regions. Small old lacunar infarct within the RIGHT basal ganglia region. No mass, hemorrhage, edema or other evidence of acute parenchymal abnormality. No extra-axial hemorrhage. Vascular: No hyperdense vessel or unexpected calcification. Skull: Normal. Negative for fracture or focal lesion. Sinuses/Orbits: No acute finding. Other: None. IMPRESSION: 1. No acute findings. No intracranial mass, hemorrhage or edema. No skull fracture. 2. Chronic small vessel ischemic changes, as detailed above. Electronically Signed   By: Bary Richard M.D.   On: 08/02/2022 14:11   CT ABDOMEN PELVIS W WO CONTRAST  Result Date: 08/01/2022 CLINICAL DATA:  Concern for urinary outlet obstruction, prolonged constipation and leukocytosis. EXAM: CT ABDOMEN AND PELVIS WITHOUT AND WITH CONTRAST TECHNIQUE: Multidetector CT imaging of the  abdomen and pelvis was performed following the standard protocol before and following the bolus administration of intravenous contrast. RADIATION DOSE REDUCTION: This exam was performed according to the departmental dose-optimization program which includes automated exposure control, adjustment of the mA and/or kV according to patient size and/or use of iterative reconstruction technique. CONTRAST:  OMNIPAQUE IOHEXOL 300 MG/ML  SOLN COMPARISON:  None Available. FINDINGS: Lower chest: Mild atelectasis is noted at the lung bases. Hepatobiliary: No focal abnormality in the liver. Fatty infiltration of the liver is noted. No biliary ductal dilatation. Hyperdense material is present in the gallbladder, possible stones or sludge. Pancreas: Unremarkable. No pancreatic ductal dilatation or surrounding inflammatory changes. Spleen: Normal in size without focal abnormality. Adrenals/Urinary Tract: The adrenal glands are within normal limits. Perinephric edema is noted bilaterally. There is a cyst in the upper pole of the right kidney. A nonobstructive calculus is noted on the right. Patchy hypoenhancement is noted in the upper and lower poles of the left kidney. No ureteral calculus or obstructive uropathy bilaterally. Bladder is unremarkable. Stomach/Bowel: There is a small hiatal hernia. Stomach is within normal limits. Appendix appears normal. No evidence of bowel wall thickening, distention, or inflammatory changes. No free air or pneumatosis. Few scattered diverticula are present along the colon without evidence of diverticulitis. Vascular/Lymphatic: Aortic atherosclerosis. Nonspecific prominent lymph nodes are present at the porta hepatis measuring up to 1.2 cm. Prominent lymph nodes are noted in the left periaortic space at the level of the left kidney measuring 1 cm. Reproductive: Status post hysterectomy. There is a cyst in the right adnexa measuring 2.9 cm. A cyst is present in the left adnexa measuring 1.9  cm. No follow-up imaging is recommended. Other: No abdominopelvic ascites. A small fat containing umbilical hernia is present. Musculoskeletal: Degenerative changes are present in the thoracolumbar spine. No acute or suspicious osseous abnormality. IMPRESSION: 1. Patchy hypoenhancement of the left kidney, concerning for pyelonephritis. 2. Nonobstructive right renal calculus. 3. Hepatic steatosis. 4. Hyperdense material in the gallbladder, possible stones or sludge. 5. Small hiatal hernia. 6. Aortic atherosclerosis. 7. Simple cysts in the ovaries bilaterally,  the largest measuring 2.9 cm, almost certainly benign. Yearly follow-up with ultrasound is recommended until resolved. Electronically Signed   By: Thornell Sartorius M.D.   On: 08/01/2022 21:44   DG Chest Portable 1 View  Result Date: 08/01/2022 CLINICAL DATA:  Altered mental status. Recent urinary tract infection. EXAM: PORTABLE CHEST 1 VIEW COMPARISON:  One-view chest x-ray 07/29/2022 FINDINGS: The heart is enlarged. Mild pulmonary vascular congestion present. Lung volumes are low. No focal airspace consolidation present. No edema or effusion is present IMPRESSION: Cardiomegaly and mild pulmonary vascular congestion. Electronically Signed   By: Marin Roberts M.D.   On: 08/01/2022 16:28    Scheduled Meds:  apixaban  5 mg Oral BID   hydrALAZINE  25 mg Oral Q8H   insulin aspart  0-9 Units Subcutaneous TID WC   multivitamin with minerals  1 tablet Oral Daily   Continuous Infusions:  cefTRIAXone (ROCEPHIN)  IV Stopped (08/02/22 2126)   fluconazole (DIFLUCAN) IV 200 mg (08/03/22 0921)     LOS: 1 day    Time spent:35 mins    Willeen Niece, MD [facility]   If 7PM-7AM, please contact night-coverage

## 2022-08-03 NOTE — Plan of Care (Signed)
  Problem: Coping: Goal: Ability to adjust to condition or change in health will improve Outcome: Progressing   Problem: Fluid Volume: Goal: Ability to maintain a balanced intake and output will improve Outcome: Progressing   Problem: Nutritional: Goal: Maintenance of adequate nutrition will improve Outcome: Progressing   Problem: Nutritional: Goal: Progress toward achieving an optimal weight will improve Outcome: Progressing   Problem: Activity: Goal: Risk for activity intolerance will decrease Outcome: Progressing

## 2022-08-04 DIAGNOSIS — N390 Urinary tract infection, site not specified: Secondary | ICD-10-CM | POA: Diagnosis not present

## 2022-08-04 LAB — GLUCOSE, CAPILLARY
Glucose-Capillary: 173 mg/dL — ABNORMAL HIGH (ref 70–99)
Glucose-Capillary: 217 mg/dL — ABNORMAL HIGH (ref 70–99)
Glucose-Capillary: 217 mg/dL — ABNORMAL HIGH (ref 70–99)
Glucose-Capillary: 166 mg/dL — ABNORMAL HIGH (ref 70–99)

## 2022-08-04 MED ORDER — AMLODIPINE BESYLATE 10 MG PO TABS
10.0000 mg | ORAL_TABLET | Freq: Every day | ORAL | Status: DC
  Administered 2022-08-04 – 2022-08-06 (×3): 10 mg via ORAL
  Filled 2022-08-04 (×3): qty 1

## 2022-08-04 MED ORDER — SULFAMETHOXAZOLE-TRIMETHOPRIM 800-160 MG PO TABS
1.0000 | ORAL_TABLET | Freq: Two times a day (BID) | ORAL | Status: DC
  Administered 2022-08-04 – 2022-08-06 (×4): 1 via ORAL
  Filled 2022-08-04 (×4): qty 1

## 2022-08-04 NOTE — Progress Notes (Signed)
PROGRESS NOTE    Ebony Ashley  ZOX:[id number] DOB: 1945/02/07 DOA: 08/01/2022  PCP: Margaree Mackintosh, MD    Brief Narrative:  This 77 yrs old female with PMH significant for DM, HTN, A.fib on Eliquis, and frequent UTIs, severe obesity who presented to the ED with generalized weakness.  Patient was recently treated 3 times for UTI in last two months. Patient reports not feeling well since she has last UTI, she is getting gradual weakness which has been progressive for loss 2 months. She reports difficulty getting to the bathroom and unable to get up from the commode.  She lives alone,  her son lives in [place] and considering about moving her to his house in [place].  Pertinent labs in the ED CK15 62, lactic acid normal 16.5 K.  UA is positive.  Recent urine culture was susceptible to ceftriaxone.She is admitted for further evaluation.  Assessment & Plan:   Principal Problem:   Recurrent UTI  Generalized weakness secondary to Recurrent UTI: Patient presented with generalized weakness, increased frequency of urine, leukocytosis. Doubt stercoral colitis.  She also has erythematous area under her left breast and left thigh.  Does not see any signs of abscess in her wounds.  CT abdomen/ Pelvis showed findings concerning for pyelonephritis. Initiated on empiric antibiotics IV ceftriaxone. Urine culture showed multiple species.   Antibiotics de-escalated to Bactrim for 5 more days.  Intertrigo: Wound care consulted.  Recommended fluconazole. Erythematous areas/rashes improving.  Confusion:  This could be secondary to UTI. CT head no acute intracranial abnormality noted. Mental status appears back to baseline.   Urinary frequency: Initiated on empiric ceftriaxone for now.   Urine culture multiple species. May consider urology consult.  Hold vesicare for now.  Generalized weakness / Deconditioning: Elevated CK levels: Differentials include rhabdo, myositis but doubtful.  PT  and OT evaluation . SNF  DM II: Poorly controlled. Patient takes Januvia, metformin and glipizide at home. Hold p.o. diabetic medications. Continue regular insulin sliding scale.   Hb A1c 8.2  Essential hypertension Patient used to take ramipril and metoprolol but not taking it recently. Continue hydralazine 25 mg 3 times daily. Continue metoprolol 50 mg daily. Started amlodipine 5 mg daily.  Hyperlipidemia: Continue Crestor  Chronic atrial fibrillation Rate well-controlled.  Continue Eliquis.  Severe obesity Diet and exercise discussed in detail.  DVT prophylaxis: Eliquis Code Status: Full code. Family Communication: No family at bedside. Disposition Plan:     Status is: Inpatient Remains inpatient appropriate because: Admitted for generalized weakness likely secondary to UTI.  PT and OT evaluation recommended SNF.    Consultants:  None  Procedures: None  Antimicrobials:  Anti-infectives (From admission, onward)    Start     Dose/Rate Route Frequency Ordered Stop   08/04/22 2000  sulfamethoxazole-trimethoprim (BACTRIM DS) 800-160 MG per tablet 1 tablet        1 tablet Oral Every 12 hours 08/04/22 1208 08/09/22 0959   08/02/22 0900  fluconazole (DIFLUCAN) IVPB 200 mg        200 mg 100 mL/hr over 60 Minutes Intravenous Every 24 hours 08/02/22 0811     08/01/22 2030  cefTRIAXone (ROCEPHIN) 1 g in sodium chloride 0.9 % 100 mL IVPB  Status:  Discontinued        1 g 200 mL/hr over 30 Minutes Intravenous Every 24 hours 08/01/22 2030 08/04/22 1208   08/01/22 1915  fluconazole (DIFLUCAN) tablet 200 mg        200 mg Oral  Once  08/01/22 1902 08/01/22 2024      Subjective: Patient was seen and examined at bedside.  Overnight events noted.   Patient reports doing much better.  He still reports weak and tired.  Rash under the breasts and in the thigh areas has much improved.  Objective: Vitals:   08/03/22 1358 08/03/22 2158 08/04/22 0643 08/04/22 0711  BP: (!)  153/103 (!) 149/110 (!) 153/110   Pulse: (!) 110 96 (!) 106   Resp: 16 18 17    Temp: 97.8 F (36.6 C) 98.8 F (37.1 C) 97.8 F (36.6 C)   TempSrc: Oral Oral Oral   SpO2: 97% 93% 95%   Weight:    117.9 kg  Height:        Intake/Output Summary (Last 24 hours) at 08/04/2022 1244 Last data filed at 08/04/2022 1115 Gross per 24 hour  Intake 600 ml  Output 3350 ml  Net -2750 ml   Filed Weights   08/02/22 0430 08/02/22 0500 08/04/22 0711  Weight: 115.1 kg 116.5 kg 117.9 kg    Examination:  General exam: Appears comfortable, deconditioned, not in any acute distress. Respiratory system: CTA bilaterally. Respiratory effort normal.  RR 15 Cardiovascular system: S1 & S2 heard, regular rate and rhythm, no murmur.  No pedal edema. Gastrointestinal system: Abdomen is soft, non tender, non distended, bowel sounds present. Central nervous system: Alert and oriented x 3. No focal neurological deficits. Extremities: No edema, No cyanosis, no clubbing Skin: Erythematous areas noted under left breast, left thigh area > Improving Psychiatry: Judgement and insight appear normal. Mood & affect appropriate.     Data Reviewed: I have personally reviewed following labs and imaging studies  CBC: Recent Labs  Lab 07/29/22 1705 08/01/22 1600 08/02/22 0510  WBC 13.1* 16.5* 9.8  NEUTROABS 10.9* 14.2*  --   HGB 15.6* 17.0* 15.0  HCT 48.8* 53.5* 46.2*  MCV 93.7 95.9 92.4  PLT 260 260 247   Basic Metabolic Panel: Recent Labs  Lab 07/29/22 1705 08/01/22 1804 08/01/22 2303 08/02/22 0510 08/04/22 0338  NA 137 138  --  139 139  K 4.2 4.0  --  3.5 3.5  CL 100 101  --  105 104  CO2 24 26  --  24 25  GLUCOSE 199* 161*  --  160* 186*  BUN 12 12  --  8 8  CREATININE 0.78 0.66  --  0.60 0.64  CALCIUM 9.0 8.9  --  8.4* 8.5*  MG  --   --  1.7  --  1.9  PHOS  --   --  2.8  --  2.8   GFR: Estimated Creatinine Clearance: 70.5 mL/min (by C-G formula based on SCr of 0.64 mg/dL). Liver Function  Tests: Recent Labs  Lab 07/29/22 1705 08/01/22 1804 08/02/22 0510  AST 23 50* 33  ALT 17 25 23   ALKPHOS 101 93 72  BILITOT 0.7 1.1 0.9  PROT 7.1 7.3 5.9*  ALBUMIN 3.9 3.7 3.0*   No results for input(s): "LIPASE", "AMYLASE" in the last 168 hours. No results for input(s): "AMMONIA" in the last 168 hours. Coagulation Profile: No results for input(s): "INR", "PROTIME" in the last 168 hours. Cardiac Enzymes: Recent Labs  Lab 07/29/22 1705 08/01/22 1804 08/02/22 0510  CKTOTAL 196 1,562* 648*   BNP (last 3 results) No results for input(s): "PROBNP" in the last 8760 hours. HbA1C: Recent Labs    08/01/22 2303  HGBA1C 8.2*   CBG: Recent Labs  Lab 08/03/22 1133 08/03/22 1659  08/03/22 2159 08/04/22 0754 08/04/22 1208  GLUCAP 194* 182* 155* 173* 217*   Lipid Profile: No results for input(s): "CHOL", "HDL", "LDLCALC", "TRIG", "CHOLHDL", "LDLDIRECT" in the last 72 hours. Thyroid Function Tests: Recent Labs    08/01/22 2303  TSH 1.035   Anemia Panel: No results for input(s): "VITAMINB12", "FOLATE", "FERRITIN", "TIBC", "IRON", "RETICCTPCT" in the last 72 hours. Sepsis Labs: Recent Labs  Lab 08/01/22 1700  LATICACIDVEN <0.3*    Recent Results (from the past 240 hour(s))  Resp panel by RT-PCR (RSV, Flu A&B, Covid) Anterior Nasal Swab     Status: None   Collection Time: 07/29/22  5:05 PM   Specimen: Anterior Nasal Swab  Result Value Ref Range Status   SARS Coronavirus 2 by RT PCR NEGATIVE NEGATIVE Final    Comment: (NOTE) SARS-CoV-2 target nucleic acids are NOT DETECTED.  The SARS-CoV-2 RNA is generally detectable in upper respiratory specimens during the acute phase of infection. The lowest concentration of SARS-CoV-2 viral copies this assay can detect is 138 copies/mL. A negative result does not preclude SARS-Cov-2 infection and should not be used as the sole basis for treatment or other patient management decisions. A negative result may occur with  improper  specimen collection/handling, submission of specimen other than nasopharyngeal swab, presence of viral mutation(s) within the areas targeted by this assay, and inadequate number of viral copies(<138 copies/mL). A negative result must be combined with clinical observations, patient history, and epidemiological information. The expected result is Negative.  Fact Sheet for Patients:  BloggerCourse.com  Fact Sheet for Healthcare Providers:  SeriousBroker.it  This test is no t yet approved or cleared by the [place] FDA and  has been authorized for detection and/or diagnosis of SARS-CoV-2 by FDA under an Emergency Use Authorization (EUA). This EUA will remain  in effect (meaning this test can be used) for the duration of the COVID-19 declaration under Section 564(b)(1) of the Act, 21 U.S.C.section 360bbb-3(b)(1), unless the authorization is terminated  or revoked sooner.       Influenza A by PCR NEGATIVE NEGATIVE Final   Influenza B by PCR NEGATIVE NEGATIVE Final    Comment: (NOTE) The Xpert Xpress SARS-CoV-2/FLU/RSV plus assay is intended as an aid in the diagnosis of influenza from Nasopharyngeal swab specimens and should not be used as a sole basis for treatment. Nasal washings and aspirates are unacceptable for Xpert Xpress SARS-CoV-2/FLU/RSV testing.  Fact Sheet for Patients: BloggerCourse.com  Fact Sheet for Healthcare Providers: SeriousBroker.it  This test is not yet approved or cleared by the [place] FDA and has been authorized for detection and/or diagnosis of SARS-CoV-2 by FDA under an Emergency Use Authorization (EUA). This EUA will remain in effect (meaning this test can be used) for the duration of the COVID-19 declaration under Section 564(b)(1) of the Act, 21 U.S.C. section 360bbb-3(b)(1), unless the authorization is terminated or revoked.     Resp  Syncytial Virus by PCR NEGATIVE NEGATIVE Final    Comment: (NOTE) Fact Sheet for Patients: BloggerCourse.com  Fact Sheet for Healthcare Providers: SeriousBroker.it  This test is not yet approved or cleared by the [place] FDA and has been authorized for detection and/or diagnosis of SARS-CoV-2 by FDA under an Emergency Use Authorization (EUA). This EUA will remain in effect (meaning this test can be used) for the duration of the COVID-19 declaration under Section 564(b)(1) of the Act, 21 U.S.C. section 360bbb-3(b)(1), unless the authorization is terminated or revoked.  Performed at [facility], [address], [place], [place] [address]   Culture, blood (routine x 2)     Status: None (Preliminary result)   Collection Time: 08/01/22  4:00 PM   Specimen: BLOOD RIGHT HAND  Result Value Ref Range Status   Specimen Description   Final    BLOOD RIGHT HAND Performed at [facility], [address], [place], [place] [address]    Special Requests   Final    BOTTLES DRAWN AEROBIC AND ANAEROBIC Blood Culture adequate volume Performed at [facility], [address] [place], [place] [address]    Culture   Final    NO GROWTH 3 DAYS Performed at [facility], [address], [place], [place] [address]    Report Status PENDING  Incomplete  Culture, blood (routine x 2)     Status: None (Preliminary result)   Collection Time: 08/01/22  4:41 PM   Specimen: BLOOD RIGHT ARM  Result Value Ref Range Status   Specimen Description   Final    BLOOD RIGHT ARM Performed at [facility], [address] [place], [place] [address]    Special Requests   Final    BOTTLES DRAWN AEROBIC AND ANAEROBIC Blood Culture results may not be optimal due to an excessive volume of blood received in culture bottles Performed at [facility], [address] [place], [place] [address]    Culture   Final    NO GROWTH 3 DAYS Performed at [facility], [address], [place], [place] [address]    Report Status PENDING  Incomplete  SARS Coronavirus 2 by RT PCR (hospital order, performed in [facility] hospital lab) *cepheid single result test* Anterior Nasal Swab     Status: None   Collection Time: 08/01/22  7:08 PM   Specimen: Anterior Nasal Swab  Result Value Ref Range Status   SARS Coronavirus 2 by RT PCR NEGATIVE NEGATIVE Final    Comment: (NOTE) SARS-CoV-2 target nucleic acids are NOT DETECTED.  The SARS-CoV-2 RNA is generally detectable in upper and lower respiratory specimens during the acute phase of infection. The lowest concentration of SARS-CoV-2 viral copies this assay can detect is 250 copies / mL. A negative result does not preclude SARS-CoV-2 infection and should not be used as the sole basis for treatment or other patient management decisions.  A negative result may occur with improper specimen collection / handling, submission of specimen other than nasopharyngeal swab, presence of viral mutation(s) within the areas targeted by this assay, and inadequate number of viral copies (<250 copies / mL). A negative result must be combined with clinical observations, patient history, and epidemiological information.  Fact Sheet for Patients:   RoadLapTop.co.za  Fact Sheet for Healthcare Providers: http://kim-miller.com/  This test is not yet approved or  cleared by the [place] FDA and has been authorized for detection and/or diagnosis of SARS-CoV-2 by FDA under an Emergency Use Authorization (EUA).  This EUA will remain in effect (meaning this test can be used) for the duration of the COVID-19 declaration under Section 564(b)(1) of the Act, 21 U.S.C. section 360bbb-3(b)(1), unless the authorization is terminated or revoked sooner.  Performed at [facility], [address] [place], [place] [address]   Urine Culture (for pregnant, neutropenic or urologic patients or patients with an indwelling urinary catheter)     Status: Abnormal   Collection Time: 08/01/22  9:57 PM   Specimen: Urine, Clean Catch  Result Value Ref Range Status   Specimen Description   Final    URINE, CLEAN CATCH Performed at [facility], [address], [place], [place] [address]    Special Requests   Final    NONE Performed at [facility], [address], [place], [place] [address]    Culture MULTIPLE SPECIES PRESENT, SUGGEST RECOLLECTION (A)  Final   Report Status 08/03/2022 FINAL  Final    Radiology Studies: No results found.  Scheduled Meds:  amLODipine  10 mg Oral Daily   apixaban  5 mg Oral BID   hydrALAZINE  25 mg Oral Q8H   insulin aspart  0-9 Units Subcutaneous TID WC   metoprolol succinate  50 mg Oral Daily   multivitamin with minerals  1 tablet Oral Daily   sulfamethoxazole-trimethoprim  1 tablet Oral Q12H   Continuous Infusions:  fluconazole (DIFLUCAN) IV 200 mg (08/04/22 0935)     LOS: 2 days    Time spent:35 mins    Willeen Niece, MD [facility]   If 7PM-7AM, please contact night-coverage

## 2022-08-04 NOTE — Progress Notes (Signed)
Physical Therapy Treatment Patient Details Name: MAHEEN DEDIOS MRN: [id number] DOB: 1945/04/18 Today's Date: 08/04/2022   History of Present Illness Pt is 77 yo female admitted on 08/01/22 with generalized weakness secondary to recurrent UTI. Pt with hx including but not limited to DM, HTN, afib, and frequent UTI    PT Comments  Pt making daily progress but still required min A at times to stand, mod A bed mobility, and fatigues easily with ambulation.  She had difficulty recalling transfer techniques within session requiring repeated cues. Pt lives alone and remains fall risk .  Continue to recommend post acute rehab unless pt has supervision at home.     If plan is discharge home, recommend the following: A little help with walking and/or transfers;A little help with bathing/dressing/bathroom;Help with stairs or ramp for entrance;Assistance with cooking/housework   Can travel by private vehicle     Yes  Equipment Recommendations  BSC/3in1    Recommendations for Other Services       Precautions / Restrictions Precautions Precautions: Fall     Mobility  Bed Mobility Overal bed mobility: Needs Assistance Bed Mobility: Supine to Sit     Supine to sit: HOB elevated, Mod assist     General bed mobility comments: increased time ; also increased assist to scoot forward today as pt on dermatherapy sheets Monday and normal sheets today with increased fricition to overcome    Transfers Overall transfer level: Needs assistance Equipment used: Rolling walker (2 wheels) Transfers: Sit to/from Stand Sit to Stand: Min assist, Min guard           General transfer comment: Sit to stand x 6 from bed and x 5 from chair.  Cues to scoot forward, push from seated surface, nose over toes, and use momentum.  Initially requiring min A but progressing to min guard.  Pt at one point felt would be better with hands on RW - allowed her to try to see that RW would tip. Resumed pushing from bed.   Last stand at end of session, pt again wanting to pull from RW and required cues for hand placement on bed    Ambulation/Gait Ambulation/Gait assistance: Min guard Gait Distance (Feet): 30 Feet (30'x3) Assistive device: Rolling walker (2 wheels) Gait Pattern/deviations: Step-to pattern, Decreased stride length, Shuffle Gait velocity: decreased     General Gait Details: Fatigued easily requiring chair follow and 3 min rest breaks.  Cues for RW proximity   Stairs             Wheelchair Mobility     Tilt Bed    Modified Rankin (Stroke Patients Only)       Balance Overall balance assessment: Needs assistance Sitting-balance support: No upper extremity supported Sitting balance-Leahy Scale: Good     Standing balance support: Bilateral upper extremity supported, Reliant on assistive device for balance Standing balance-Leahy Scale: Poor                              Cognition Arousal/Alertness: Awake/alert Behavior During Therapy: WFL for tasks assessed/performed Overall Cognitive Status: No family/caregiver present to determine baseline cognitive functioning                                 General Comments: Follows commands and oriented but decreased short term memory and recall of transfer techniques. Also, provided repeated instruction to not get  up alone and call nursing staff.  Utilized repetition, demos, trial and error, and teach back for transfer technqiues and safety.         Exercises General Exercises - Lower Extremity Long Arc Quad: AROM, Both, 10 reps, Seated Hip Flexion/Marching: AROM, 20 reps, Seated, Standing, Both (10 seated, 10 standing with RW and min guard) Heel Raises: AROM, Both, 10 reps, Standing    General Comments        Pertinent Vitals/Pain Pain Assessment Pain Assessment: No/denies pain    Home Living                          Prior Function            PT Goals (current goals can now be  found in the care plan section) Progress towards PT goals: Progressing toward goals    Frequency    Min 1X/week      PT Plan Current plan remains appropriate    Co-evaluation              AM-PAC PT "6 Clicks" Mobility   Outcome Measure  Help needed turning from your back to your side while in a flat bed without using bedrails?: A Little Help needed moving from lying on your back to sitting on the side of a flat bed without using bedrails?: A Little Help needed moving to and from a bed to a chair (including a wheelchair)?: A Little Help needed standing up from a chair using your arms (e.g., wheelchair or bedside chair)?: A Little Help needed to walk in hospital room?: A Little Help needed climbing 3-5 steps with a railing? : Total 6 Click Score: 16    End of Session Equipment Utilized During Treatment: Gait belt Activity Tolerance: Patient tolerated treatment well Patient left: in chair;with call bell/phone within reach;with chair alarm set (Left door open) Nurse Communication: Mobility status PT Visit Diagnosis: Other abnormalities of gait and mobility (R26.89);Muscle weakness (generalized) (M62.81)     Time: 1610-9604 PT Time Calculation (min) (ACUTE ONLY): 24 min  Charges:    $Gait Training: 8-22 mins $Therapeutic Exercise: 8-22 mins PT General Charges $$ ACUTE PT VISIT: 1 Visit                     Anise Salvo, PT Acute Rehab Services [facility] [phone]    Rayetta Humphrey 08/04/2022, 12:06 PM

## 2022-08-04 NOTE — Plan of Care (Signed)
  Problem: Skin Integrity: Goal: Risk for impaired skin integrity will decrease Outcome: Progressing   Problem: Education: Goal: Knowledge of General Education information will improve Description: Including pain rating scale, medication(s)/side effects and non-pharmacologic comfort measures Outcome: Progressing   Problem: Activity: Goal: Risk for activity intolerance will decrease Outcome: Progressing   

## 2022-08-05 DIAGNOSIS — N390 Urinary tract infection, site not specified: Secondary | ICD-10-CM | POA: Diagnosis not present

## 2022-08-05 LAB — CK: Total CK: 94 U/L (ref 38–234)

## 2022-08-05 LAB — GLUCOSE, CAPILLARY
Glucose-Capillary: 220 mg/dL — ABNORMAL HIGH (ref 70–99)
Glucose-Capillary: 244 mg/dL — ABNORMAL HIGH (ref 70–99)
Glucose-Capillary: 221 mg/dL — ABNORMAL HIGH (ref 70–99)
Glucose-Capillary: 192 mg/dL — ABNORMAL HIGH (ref 70–99)

## 2022-08-05 MED ORDER — FLUCONAZOLE 100 MG PO TABS
200.0000 mg | ORAL_TABLET | Freq: Every day | ORAL | Status: DC
  Administered 2022-08-06: 200 mg via ORAL
  Filled 2022-08-05: qty 2

## 2022-08-05 NOTE — TOC Progression Note (Signed)
Transition of Care [facility]) - Progression Note    Patient Details  Name: Ebony Ashley MRN: [id number] Date of Birth: [birth year]  Transition of Care [facility]) CM/SW Contact  Amada [place], [place] Phone Number: 08/05/2022, 3:06 PM  Clinical Narrative:     Have reviewed SNF bed offers with pt, however, she wants TOC to speak with son to review as well. Have left VM x 2 today.  Have received insurance auth for SNF [id number]) and for PTAR transport (# [id number]).  Anticipate dc tomorrow once bed choice made.  Expected Discharge Plan: Skilled Nursing Facility Barriers to Discharge: Continued Medical Work up, English as a second language teacher, SNF Pending bed offer  Expected Discharge Plan and Services In-house Referral: Clinical Social Work   Post Acute Care Choice: Skilled Nursing Facility Living arrangements for the past 2 months: Single Family Home                 DME Arranged: N/A DME Agency: NA                   Social Determinants of Health (SDOH) Interventions SDOH Screenings   Food Insecurity: No Food Insecurity (08/01/2022)  Housing: Low Risk  (08/01/2022)  Transportation Needs: No Transportation Needs (08/01/2022)  Utilities: Not At Risk (08/01/2022)  Depression (PHQ2-9): Low Risk  (07/16/2022)  Social Connections: Unknown (05/27/2022)   [facility] from [facility]  Tobacco Use: Low Risk  (08/01/2022)    Readmission Risk Interventions    08/03/2022   11:52 AM  Readmission Risk Prevention Plan  Transportation Screening Complete  PCP or Specialist Appt within 5-7 Days Complete  Home Care Screening Complete  Medication Review (RN CM) Complete

## 2022-08-05 NOTE — Plan of Care (Signed)
  Problem: Skin Integrity: Goal: Risk for impaired skin integrity will decrease Outcome: Progressing   Problem: Education: Goal: Knowledge of General Education information will improve Description: Including pain rating scale, medication(s)/side effects and non-pharmacologic comfort measures Outcome: Progressing   Problem: Nutrition: Goal: Adequate nutrition will be maintained Outcome: Progressing

## 2022-08-05 NOTE — Progress Notes (Addendum)
Mobility Specialist - Progress Note   08/05/22 1312  Mobility  Activity Ambulated with assistance in hallway  Level of Assistance Standby assist, set-up cues, supervision of patient - no hands on  Assistive Device Front wheel walker  Distance Ambulated (ft) 120 ft  Activity Response Tolerated well  Mobility Referral Yes  $Mobility charge 1 Mobility  Mobility Specialist Start Time (ACUTE ONLY) 1254  Mobility Specialist Stop Time (ACUTE ONLY) 1312  Mobility Specialist Time Calculation (min) (ACUTE ONLY) 18 min   Pt received in bed and agreeable to mobility. No complaints during session. Pt to bed after session with all needs met. Bed alarm on.  [facility]

## 2022-08-05 NOTE — Progress Notes (Signed)
PROGRESS NOTE    DANYELE CULLIPHER  ZOX:[id number] DOB: August 23, 1945 DOA: 08/01/2022  PCP: Margaree Mackintosh, MD    Brief Narrative:  This 77 yrs old female with PMH significant for DM, HTN, A.fib on Eliquis, and frequent UTIs, severe obesity who presented to the ED with generalized weakness.  Patient was recently treated 3 times for UTI in last two months. Patient reports not feeling well since she has last UTI, she is getting gradual weakness which has been progressive for loss 2 months. She reports difficulty getting to the bathroom and unable to get up from the commode.  She lives alone,  her son lives in [place] and considering about moving her to his house in [place].  Pertinent labs in the ED CK15 62, lactic acid normal 16.5 K.  UA is positive.  Recent urine culture was susceptible to ceftriaxone.She is admitted for further evaluation.  Assessment & Plan:   Principal Problem:   Recurrent UTI  Generalized weakness secondary to Recurrent UTI: Patient presented with generalized weakness, increased frequency of urine, leukocytosis. Doubt stercoral colitis.  She also has erythematous areas under her left breast and left thigh.  Does not see any signs of abscess in her wounds.  CT abdomen/ Pelvis showed findings concerning for pyelonephritis. Initiated on empiric antibiotics IV ceftriaxone. Urine culture showed multiple species.   Antibiotics de-escalated to Bactrim for 5 more days. PT and OT recommended skilled nursing facility.  Awaiting insurance authorization  Intertrigo: Wound care consulted.  Recommended fluconazole x 7 days. Erythematous areas/rashes improving.  Confusion:  This could be secondary to UTI. CT head no acute intracranial abnormality noted. Mental status appears back to baseline.   Urinary frequency: Initiated on empiric ceftriaxone , changed to bactrim.   Urine culture multiple species. May consider urology consult.  Hold vesicare for now.  Generalized  weakness / Deconditioning: Elevated CK levels:  Resolved. Differentials include rhabdo, myositis but doubtful.  PT and OT evaluation > SNF.  DM II: Poorly controlled. Patient takes Januvia, metformin and glipizide at home. Hold PO diabetic medications. Continue regular insulin sliding scale.   Hb A1c 8.2  Essential hypertension Patient used to take ramipril and metoprolol but not taking it recently. Continue hydralazine 25 mg 3 times daily. Continue metoprolol 50 mg daily. Continue amlodipine 5 mg daily.  Hyperlipidemia: Continue Crestor.  Chronic atrial fibrillation Rate well-controlled.  Continue Eliquis.  Severe obesity Diet and exercise discussed in detail.  DVT prophylaxis: Eliquis Code Status: Full code. Family Communication: No family at bedside. Disposition Plan:     Status is: Inpatient Remains inpatient appropriate because: Admitted for generalized weakness likely secondary to UTI.  PT and OT evaluation recommended SNF.    Consultants:  None  Procedures: None  Antimicrobials:  Anti-infectives (From admission, onward)    Start     Dose/Rate Route Frequency Ordered Stop   08/06/22 1000  fluconazole (DIFLUCAN) tablet 200 mg        200 mg Oral Daily 08/05/22 1111 08/08/22 0959   08/04/22 2000  sulfamethoxazole-trimethoprim (BACTRIM DS) 800-160 MG per tablet 1 tablet        1 tablet Oral Every 12 hours 08/04/22 1208 08/09/22 0959   08/02/22 0900  fluconazole (DIFLUCAN) IVPB 200 mg  Status:  Discontinued        200 mg 100 mL/hr over 60 Minutes Intravenous Every 24 hours 08/02/22 0811 08/05/22 1111   08/01/22 2030  cefTRIAXone (ROCEPHIN) 1 g in sodium chloride 0.9 % 100 mL IVPB  Status:  Discontinued        1 g 200 mL/hr over 30 Minutes Intravenous Every 24 hours 08/01/22 2030 08/04/22 1208   08/01/22 1915  fluconazole (DIFLUCAN) tablet 200 mg        200 mg Oral  Once 08/01/22 1902 08/01/22 2024      Subjective: Patient was seen and examined at bedside.   Overnight events noted.   Patient reports doing much better.  She choked on the breakfast in the morning. Rash under the breast and thigh has significantly improved.  Objective: Vitals:   08/04/22 2305 08/05/22 0500 08/05/22 0615 08/05/22 0617  BP: (!) 115/113  (!) 133/121 (!) 152/91  Pulse: (!) 102  66 (!) 109  Resp: 16  18 18   Temp: 97.7 F (36.5 C)  98 F (36.7 C)   TempSrc: Oral  Oral   SpO2: 94%  92% 94%  Weight:  112.8 kg    Height:        Intake/Output Summary (Last 24 hours) at 08/05/2022 1120 Last data filed at 08/05/2022 0610 Gross per 24 hour  Intake 620 ml  Output 2000 ml  Net -1380 ml   Filed Weights   08/02/22 0500 08/04/22 0711 08/05/22 0500  Weight: 116.5 kg 117.9 kg 112.8 kg    Examination:  General exam: Appears comfortable, deconditioned, not in any acute distress. Respiratory system: CTA bilaterally. Respiratory effort normal.  RR 14 Cardiovascular system: S1 & S2 heard, regular rate and rhythm, no murmur.  No pedal edema. Gastrointestinal system: Abdomen is soft, non tender, non distended, bowel sounds present. Central nervous system: Alert and oriented x 3. No focal neurological deficits. Extremities: No edema, No cyanosis, no clubbing Skin: Erythematous areas noted under left breast, left thigh area > Improving Psychiatry: Judgement and insight appear normal. Mood & affect appropriate.     Data Reviewed: I have personally reviewed following labs and imaging studies  CBC: Recent Labs  Lab 07/29/22 1705 08/01/22 1600 08/02/22 0510  WBC 13.1* 16.5* 9.8  NEUTROABS 10.9* 14.2*  --   HGB 15.6* 17.0* 15.0  HCT 48.8* 53.5* 46.2*  MCV 93.7 95.9 92.4  PLT 260 260 247   Basic Metabolic Panel: Recent Labs  Lab 07/29/22 1705 08/01/22 1804 08/01/22 2303 08/02/22 0510 08/04/22 0338  NA 137 138  --  139 139  K 4.2 4.0  --  3.5 3.5  CL 100 101  --  105 104  CO2 24 26  --  24 25  GLUCOSE 199* 161*  --  160* 186*  BUN 12 12  --  8 8   CREATININE 0.78 0.66  --  0.60 0.64  CALCIUM 9.0 8.9  --  8.4* 8.5*  MG  --   --  1.7  --  1.9  PHOS  --   --  2.8  --  2.8   GFR: Estimated Creatinine Clearance: 68.6 mL/min (by C-G formula based on SCr of 0.64 mg/dL). Liver Function Tests: Recent Labs  Lab 07/29/22 1705 08/01/22 1804 08/02/22 0510  AST 23 50* 33  ALT 17 25 23   ALKPHOS 101 93 72  BILITOT 0.7 1.1 0.9  PROT 7.1 7.3 5.9*  ALBUMIN 3.9 3.7 3.0*   No results for input(s): "LIPASE", "AMYLASE" in the last 168 hours. No results for input(s): "AMMONIA" in the last 168 hours. Coagulation Profile: No results for input(s): "INR", "PROTIME" in the last 168 hours. Cardiac Enzymes: Recent Labs  Lab 07/29/22 1705 08/01/22 1804 08/02/22 0510 08/05/22  0842  CKTOTAL 196 1,562* 648* 94   BNP (last 3 results) No results for input(s): "PROBNP" in the last 8760 hours. HbA1C: No results for input(s): "HGBA1C" in the last 72 hours.  CBG: Recent Labs  Lab 08/04/22 1208 08/04/22 1703 08/04/22 2138 08/05/22 0725 08/05/22 1112  GLUCAP 217* 217* 166* 220* 244*   Lipid Profile: No results for input(s): "CHOL", "HDL", "LDLCALC", "TRIG", "CHOLHDL", "LDLDIRECT" in the last 72 hours. Thyroid Function Tests: No results for input(s): "TSH", "T4TOTAL", "FREET4", "T3FREE", "THYROIDAB" in the last 72 hours.  Anemia Panel: No results for input(s): "VITAMINB12", "FOLATE", "FERRITIN", "TIBC", "IRON", "RETICCTPCT" in the last 72 hours. Sepsis Labs: Recent Labs  Lab 08/01/22 1700  LATICACIDVEN <0.3*    Recent Results (from the past 240 hour(s))  Resp panel by RT-PCR (RSV, Flu A&B, Covid) Anterior Nasal Swab     Status: None   Collection Time: 07/29/22  5:05 PM   Specimen: Anterior Nasal Swab  Result Value Ref Range Status   SARS Coronavirus 2 by RT PCR NEGATIVE NEGATIVE Final    Comment: (NOTE) SARS-CoV-2 target nucleic acids are NOT DETECTED.  The SARS-CoV-2 RNA is generally detectable in upper respiratory specimens  during the acute phase of infection. The lowest concentration of SARS-CoV-2 viral copies this assay can detect is 138 copies/mL. A negative result does not preclude SARS-Cov-2 infection and should not be used as the sole basis for treatment or other patient management decisions. A negative result may occur with  improper specimen collection/handling, submission of specimen other than nasopharyngeal swab, presence of viral mutation(s) within the areas targeted by this assay, and inadequate number of viral copies(<138 copies/mL). A negative result must be combined with clinical observations, patient history, and epidemiological information. The expected result is Negative.  Fact Sheet for Patients:  BloggerCourse.com  Fact Sheet for Healthcare Providers:  SeriousBroker.it  This test is no t yet approved or cleared by the [place] FDA and  has been authorized for detection and/or diagnosis of SARS-CoV-2 by FDA under an Emergency Use Authorization (EUA). This EUA will remain  in effect (meaning this test can be used) for the duration of the COVID-19 declaration under Section 564(b)(1) of the Act, 21 U.S.C.section 360bbb-3(b)(1), unless the authorization is terminated  or revoked sooner.       Influenza A by PCR NEGATIVE NEGATIVE Final   Influenza B by PCR NEGATIVE NEGATIVE Final    Comment: (NOTE) The Xpert Xpress SARS-CoV-2/FLU/RSV plus assay is intended as an aid in the diagnosis of influenza from Nasopharyngeal swab specimens and should not be used as a sole basis for treatment. Nasal washings and aspirates are unacceptable for Xpert Xpress SARS-CoV-2/FLU/RSV testing.  Fact Sheet for Patients: BloggerCourse.com  Fact Sheet for Healthcare Providers: SeriousBroker.it  This test is not yet approved or cleared by the [place] FDA and has been authorized for detection  and/or diagnosis of SARS-CoV-2 by FDA under an Emergency Use Authorization (EUA). This EUA will remain in effect (meaning this test can be used) for the duration of the COVID-19 declaration under Section 564(b)(1) of the Act, 21 U.S.C. section 360bbb-3(b)(1), unless the authorization is terminated or revoked.     Resp Syncytial Virus by PCR NEGATIVE NEGATIVE Final    Comment: (NOTE) Fact Sheet for Patients: BloggerCourse.com  Fact Sheet for Healthcare Providers: SeriousBroker.it  This test is not yet approved or cleared by the [place] FDA and has been authorized for detection and/or diagnosis of SARS-CoV-2 by FDA under an Emergency Use  Authorization (EUA). This EUA will remain in effect (meaning this test can be used) for the duration of the COVID-19 declaration under Section 564(b)(1) of the Act, 21 U.S.C. section 360bbb-3(b)(1), unless the authorization is terminated or revoked.  Performed at [facility], [address], [place], [place] [address]   Culture, blood (routine x 2)     Status: None (Preliminary result)   Collection Time: 08/01/22  4:00 PM   Specimen: BLOOD RIGHT HAND  Result Value Ref Range Status   Specimen Description   Final    BLOOD RIGHT HAND Performed at [facility], [address], [place], [place] [address]    Special Requests   Final    BOTTLES DRAWN AEROBIC AND ANAEROBIC Blood Culture adequate volume Performed at [facility], [address], [place], [place] [address]    Culture   Final    NO GROWTH 4 DAYS Performed at [facility], [address], [place], [place] [address]    Report Status PENDING  Incomplete  Culture, blood (routine x 2)     Status: None (Preliminary result)   Collection Time: 08/01/22  4:41 PM   Specimen: BLOOD RIGHT ARM  Result Value Ref Range Status   Specimen Description   Final    BLOOD RIGHT  ARM Performed at [facility], [address], [place], [place] [address]    Special Requests   Final    BOTTLES DRAWN AEROBIC AND ANAEROBIC Blood Culture results may not be optimal due to an excessive volume of blood received in culture bottles Performed at [facility], [address], [place], [place] [address]    Culture   Final    NO GROWTH 4 DAYS Performed at [facility], [address], [place], [place] [address]    Report Status PENDING  Incomplete  SARS Coronavirus 2 by RT PCR (hospital order, performed in [facility] hospital lab) *cepheid single result test* Anterior Nasal Swab     Status: None   Collection Time: 08/01/22  7:08 PM   Specimen: Anterior Nasal Swab  Result Value Ref Range Status   SARS Coronavirus 2 by RT PCR NEGATIVE NEGATIVE Final    Comment: (NOTE) SARS-CoV-2 target nucleic acids are NOT DETECTED.  The SARS-CoV-2 RNA is generally detectable in upper and lower respiratory specimens during the acute phase of infection. The lowest concentration of SARS-CoV-2 viral copies this assay can detect is 250 copies / mL. A negative result does not preclude SARS-CoV-2 infection and should not be used as the sole basis for treatment or other patient management decisions.  A negative result may occur with improper specimen collection / handling, submission of specimen other than nasopharyngeal swab, presence of viral mutation(s) within the areas targeted by this assay, and inadequate number of viral copies (<250 copies / mL). A negative result must be combined with clinical observations, patient history, and epidemiological information.  Fact Sheet for Patients:   RoadLapTop.co.za  Fact Sheet for Healthcare Providers: http://kim-miller.com/  This test is not yet approved or  cleared by the [place] FDA and has been authorized for detection and/or diagnosis of SARS-CoV-2  by FDA under an Emergency Use Authorization (EUA).  This EUA will remain in effect (meaning this test can be used) for the duration of the COVID-19 declaration under Section 564(b)(1) of the Act, 21 U.S.C. section 360bbb-3(b)(1), unless the authorization is terminated or revoked sooner.  Performed at [facility], [address] [place], [place] [address]   Urine Culture (for pregnant, neutropenic or urologic patients or patients with an indwelling urinary catheter)     Status: Abnormal   Collection Time: 08/01/22  9:57 PM   Specimen: Urine, Clean Catch  Result Value Ref Range Status   Specimen Description   Final    URINE, CLEAN CATCH Performed at [facility], [address], [place], [place] [address]    Special Requests   Final    NONE Performed at [facility], [address], [place], [place] [address]    Culture MULTIPLE SPECIES PRESENT, SUGGEST RECOLLECTION (A)  Final   Report Status 08/03/2022 FINAL  Final    Radiology Studies: No results found.  Scheduled Meds:  amLODipine  10 mg Oral Daily   apixaban  5 mg Oral BID   [START ON 08/06/2022] fluconazole  200 mg Oral Daily   hydrALAZINE  25 mg Oral Q8H   insulin aspart  0-9 Units Subcutaneous TID WC   metoprolol succinate  50 mg Oral Daily   multivitamin with minerals  1 tablet Oral Daily   sulfamethoxazole-trimethoprim  1 tablet Oral Q12H   Continuous Infusions:     LOS: 3 days    Time spent:35 mins    Willeen Niece, MD [facility]   If 7PM-7AM, please contact night-coverage

## 2022-08-05 NOTE — Care Management Important Message (Signed)
Important Message  Patient Details IM Letter given Name: Ebony Ashley MRN: [id number] Date of Birth: Aug 22, 1945   Medicare Important Message Given:  Yes     Caren Macadam 08/05/2022, 2:37 PM

## 2022-08-05 NOTE — Evaluation (Signed)
Clinical/Bedside Swallow Evaluation Patient Details  Name: Ebony Ashley MRN: [id number] Date of Birth: 09/01/1945  Today's Date: 08/05/2022 Time: SLP Start Time (ACUTE ONLY): 1007 SLP Stop Time (ACUTE ONLY): 1025 SLP Time Calculation (min) (ACUTE ONLY): 18 min  Past Medical History:  Past Medical History:  Diagnosis Date   Atrial fibrillation (HCC)    Diabetes mellitus    Esophageal spasm    Esophagitis    Hiatal hernia    Hypertension    Past Surgical History:  Past Surgical History:  Procedure Laterality Date   ABDOMINAL HYSTERECTOMY  1990   DUB, ovaries remain  Dr. Laurena Bering, off ERT 4/11   TONSILLECTOMY AND ADENOIDECTOMY  1951   HPI:  Pt is a 77 yo female presenting 7/28 with generalized weakness secondary to recurrent UTI. SLP ordered 8/1 after pt had a choking incident during breakfast. PMH includes: DM, HTN, A.fib on Eliquis, and frequent UTIs, severe obesity. Esophagram 2007 due to dysphagia and hyperactive gag reflex showed HH, mild reflux, tertiary contractions, prominent CP, and questionable Zenker's diverticulum.    Assessment / Plan / Recommendation  Clinical Impression  Pt describes a h/o what sounds like more esophageal symptoms, including the sensation of solid foods getting stuck in her chest or down near her stomach. At times she has to make herself regurgitate at home, similarly to what she says happened this morning during breakfast. Oral motor exam is [facility] and she has no overt signs of oropharyngeal dysphagia or aspiration. SLP provided education about esophageal precautions, which pt appears to use already. With these precautions in place, she said it has been at least a year since she has had an episode like the one she had this morning. Recommend regular solids and thin liquids, encouraging her to avoid her "trigger" foods. Would consider f/u with GI even on an OP basis as this appears to be more chronic and intermittent.  SLP Visit Diagnosis: Dysphagia,  unspecified (R13.10)    Aspiration Risk  Mild aspiration risk    Diet Recommendation Regular;Thin liquid    Liquid Administration via: Cup;Straw Medication Administration: Whole meds with liquid (crush large pills) Supervision: Patient able to self feed;Intermittent supervision to cue for compensatory strategies Compensations: Slow rate;Small sips/bites;Follow solids with liquid Postural Changes: Seated upright at 90 degrees;Remain upright for at least 30 minutes after po intake    Other  Recommendations Recommended Consults: Consider GI evaluation Oral Care Recommendations: Oral care BID    Recommendations for follow up therapy are one component of a multi-disciplinary discharge planning process, led by the attending physician.  Recommendations may be updated based on patient status, additional functional criteria and insurance authorization.  Follow up Recommendations No SLP follow up      Assistance Recommended at Discharge    Functional Status Assessment Patient has not had a recent decline in their functional status  Frequency and Duration            Prognosis        Swallow Study   General HPI: Pt is a 77 yo female presenting 7/28 with generalized weakness secondary to recurrent UTI. SLP ordered 8/1 after pt had a choking incident during breakfast. PMH includes: DM, HTN, A.fib on Eliquis, and frequent UTIs, severe obesity. Esophagram 2007 due to dysphagia and hyperactive gag reflex showed HH, mild reflux, tertiary contractions, prominent CP, and questionable Zenker's diverticulum. Type of Study: Bedside Swallow Evaluation Previous Swallow Assessment: none in chart Diet Prior to this Study: Regular;Thin liquids (Level 0) Temperature Spikes  Noted: No Respiratory Status: Room air History of Recent Intubation: No Behavior/Cognition: Alert;Cooperative;Pleasant mood Oral Cavity Assessment: Within Functional Limits Oral Care Completed by SLP: No Oral Cavity - Dentition:  Adequate natural dentition Vision: Functional for self-feeding Self-Feeding Abilities: Able to feed self Patient Positioning: Upright in bed Baseline Vocal Quality: Normal Volitional Cough: Strong Volitional Swallow: Able to elicit    Oral/Motor/Sensory Function Overall Oral Motor/Sensory Function: Within functional limits   Ice Chips Ice chips: Not tested   Thin Liquid Thin Liquid: Within functional limits Presentation: Self Fed;Straw    Nectar Thick Nectar Thick Liquid: Not tested   Honey Thick Honey Thick Liquid: Not tested   Puree Puree: Not tested (deferred - pt says applesauce gives her reflux)   Solid     Solid: Within functional limits Presentation: Self Fed      Mahala Menghini., M.A. CCC-SLP Acute Rehabilitation Services Office [phone]  Secure chat preferred  08/05/2022,10:28 AM

## 2022-08-06 DIAGNOSIS — N390 Urinary tract infection, site not specified: Secondary | ICD-10-CM | POA: Diagnosis not present

## 2022-08-06 LAB — GLUCOSE, CAPILLARY
Glucose-Capillary: 205 mg/dL — ABNORMAL HIGH (ref 70–99)
Glucose-Capillary: 208 mg/dL — ABNORMAL HIGH (ref 70–99)

## 2022-08-06 MED ORDER — SULFAMETHOXAZOLE-TRIMETHOPRIM 800-160 MG PO TABS: 1.0000 | ORAL_TABLET | Freq: Two times a day (BID) | ORAL | 0 refills | Status: AC

## 2022-08-06 MED ORDER — FLUCONAZOLE 200 MG PO TABS: 200.0000 mg | ORAL_TABLET | Freq: Every day | ORAL | 0 refills | Status: AC

## 2022-08-06 MED ORDER — METOPROLOL SUCCINATE ER 50 MG PO TB24: 50.0000 mg | ORAL_TABLET | Freq: Every day | ORAL | 1 refills | Status: AC

## 2022-08-06 NOTE — Inpatient Diabetes Management (Signed)
Inpatient Diabetes Program Recommendations  AACE/ADA: New Consensus Statement on Inpatient Glycemic Control   Target Ranges:  Prepandial:   less than 140 mg/dL      Peak postprandial:   less than 180 mg/dL (1-2 hours)      Critically ill patients:  140 - 180 mg/dL    Latest Reference Range & Units 08/05/22 07:25 08/05/22 11:12 08/05/22 16:32 08/05/22 21:45 08/06/22 07:31  Glucose-Capillary 70 - 99 mg/dL 161 (H) 096 (H) 045 (H) 192 (H) 205 (H)   Review of Glycemic Control  Diabetes history: DM2 Outpatient Diabetes medications: Glipizide XL 10 mg QAM, Metformin 850 mg QAM, Januvia 100 mg daily Current orders for Inpatient glycemic control: Novolog 0-9 units TID with meals  Inpatient Diabetes Program Recommendations:    Insulin: Please consider ordering Semglee 6 units Q24H and adding Novolog 0-5 units at bedtime.  Thanks, Orlando Penner, RN, MSN, CDCES Diabetes Coordinator Inpatient Diabetes Program [phone] (Team Pager from 8am to 5pm)

## 2022-08-06 NOTE — Progress Notes (Signed)
Patient discharge completed, awaiting PTAR

## 2022-08-06 NOTE — Plan of Care (Signed)
  Problem: Education: Goal: Ability to describe self-care measures that may prevent or decrease complications (Diabetes Survival Skills Education) will improve 08/06/2022 0740 by Beverly Sessions, RN Outcome: Progressing 08/06/2022 0740 by Beverly Sessions, RN Outcome: Progressing   Problem: Education: Goal: Knowledge of General Education information will improve Description: Including pain rating scale, medication(s)/side effects and non-pharmacologic comfort measures 08/06/2022 0740 by Beverly Sessions, RN Outcome: Progressing 08/06/2022 0740 by Beverly Sessions, RN Outcome: Progressing   Problem: Activity: Goal: Risk for activity intolerance will decrease 08/06/2022 0740 by Beverly Sessions, RN Outcome: Progressing 08/06/2022 0740 by Beverly Sessions, RN Outcome: Progressing

## 2022-08-06 NOTE — Discharge Instructions (Signed)
Advised to follow-up with primary care physician in 1 week. Advised to take Diflucan 200 mg daily for 2 more days. Advised to take Bactrim DS twice daily for 5 more doses for UTI. Advised to follow-up with urology as scheduled.

## 2022-08-06 NOTE — Discharge Summary (Addendum)
Physician Discharge Summary  Ebony Ashley IEP:[id number] DOB: May 02, 1945 DOA: 08/01/2022  PCP: Margaree Mackintosh, MD  Admit date: 08/01/2022  Discharge date: 08/06/2022  Admitted From: Home.  Disposition:  SNF.  Recommendations for Outpatient Follow-up:  Follow up with PCP in 1-2 weeks. Please obtain BMP/CBC in one week. Advised to take Diflucan 200 mg daily for 2 more days. Advised to take Bactrim DS twice daily for 5 more doses for UTI. Advised to follow-up with urology as scheduled.  Home Health: None Equipment/Devices:None  Discharge Condition: Stable CODE STATUS:Full code Diet recommendation: Heart Healthy   Brief Summary/Hospital Course: This 77 yrs old female with PMH significant for DM, HTN, A.fib on Eliquis, and frequent UTIs, severe obesity who presented to the ED with generalized weakness.  Patient was recently treated 3 times for UTI in last two months. Patient reports not feeling well since she has last UTI, She is getting gradual weakness which has been progressive for last 2 months. She reports difficulty getting to the bathroom and unable to get up from the commode. She lives alone,  her son lives in [place] and considering about moving her to his house in [place].  Pertinent labs in the ED [id number], lactic acid normal, WBC16.5 K.  UA is positive.  Recent urine culture was susceptible to ceftriaxone. She was admitted for further evaluation.  CT abdomen and pelvis shows finding concerning for pyelonephritis.  Patient was continued on ceftriaxone.  Urine culture showed multiple species blood cultures no growth so far.  Antibiotics de-escalated to Bactrim for 5 more days.  Patient was also treated with Diflucan for intertrigo.  Patient feels much better intertrigo has resolved.  Patient wants to be discharged.  Patient being discharged to a skilled nursing facility for rehab.  Discharge Diagnoses:  Principal Problem:   Recurrent UTI  Generalized weakness secondary to  Recurrent UTI: Patient presented with generalized weakness, increased frequency of urine, leukocytosis. Doubt stercoral colitis.  She also has erythematous areas under her left breast and left thigh.  Does not see any signs of abscess in her wounds.  CT abdomen/ Pelvis showed findings concerning for pyelonephritis. Initiated on empiric antibiotics IV ceftriaxone. Urine culture showed multiple species.   Antibiotics de-escalated to Bactrim for 5 more days. PT and OT recommended skilled nursing facility.    Intertrigo: Wound care consulted.  Recommended fluconazole x 7 days. Erythematous areas/rashes improved..   Confusion:  This could be secondary to UTI. CT head no acute intracranial abnormality noted. Mental status appears back to baseline.   Urinary frequency: Initiated on empiric ceftriaxone , changed to bactrim.   Urine culture multiple species. May consider urology consult.  Hold vesicare for now.   Generalized weakness / Deconditioning: Elevated CK levels:  Resolved. Differentials include rhabdo, myositis but doubtful.  PT and OT evaluation > SNF.   DM II: Poorly controlled. Resume Januvia, metformin and glipizide.  Hb A1c 8.2   Essential hypertension Patient used to take ramipril and metoprolol but not taking it recently. Continue hydralazine 25 mg 3 times daily. Continue metoprolol 50 mg daily. Continue amlodipine 10 mg daily.   Hyperlipidemia: Continue Crestor.   Chronic atrial fibrillation Rate well-controlled.  Continue Eliquis.   Severe obesity Diet and exercise discussed in detail.  Discharge Instructions  Discharge Instructions     Call MD for:  difficulty breathing, headache or visual disturbances   Complete by: As directed    Call MD for:  persistant dizziness or light-headedness   Complete by: As  directed    Call MD for:  persistant nausea and vomiting   Complete by: As directed    Diet - low sodium heart healthy   Complete by: As directed     Diet Carb Modified   Complete by: As directed    Discharge instructions   Complete by: As directed    Advised to follow-up with primary care physician in 1 week. Advised to take Diflucan 200 mg daily for 2 more days. Advised to take Bactrim DS twice daily for 5 more doses for UTI. Advised to follow-up with urology as scheduled.   Increase activity slowly   Complete by: As directed    No wound care   Complete by: As directed       Allergies as of 08/06/2022       Reactions   Adhesive [tape] Rash   Polysporin [bacitracin-polymyxin B] Rash        Medication List     STOP taking these medications    ciprofloxacin 250 MG tablet Commonly known as: Cipro   ramipril 5 MG capsule Commonly known as: ALTACE   rosuvastatin 5 MG tablet Commonly known as: CRESTOR   traMADol 50 MG tablet Commonly known as: ULTRAM       TAKE these medications    acetaminophen 500 MG tablet Commonly known as: TYLENOL Take 500 mg by mouth every 6 (six) hours as needed for moderate pain.   amLODipine 10 MG tablet Commonly known as: NORVASC TAKE 1 TABLET BY MOUTH EVERY DAY   apixaban 5 MG Tabs tablet Commonly known as: Eliquis Take 1 tablet (5 mg total) by mouth 2 (two) times daily.   Dexcom G7 Receiver Devi Use as directed   Dexcom G7 Sensor Misc Apply 1 sensor every 14 days   fluconazole 200 MG tablet Commonly known as: DIFLUCAN Take 1 tablet (200 mg total) by mouth daily for 2 days. Start taking on: August 07, 2022 What changed:  medication strength how much to take   glipiZIDE 10 MG 24 hr tablet Commonly known as: GLUCOTROL XL TAKE 1 TABLET (10 MG TOTAL) BY MOUTH DAILY WITH BREAKFAST.   metFORMIN 850 MG tablet Commonly known as: GLUCOPHAGE Take 850 mg by mouth daily with breakfast. What changed: Another medication with the same name was removed. Continue taking this medication, and follow the directions you see here.   metoprolol succinate 50 MG 24 hr tablet Commonly  known as: TOPROL-XL Take 1 tablet (50 mg total) by mouth daily. Take with or immediately following a meal. Start taking on: August 07, 2022 What changed:  medication strength how much to take how to take this when to take this additional instructions   nystatin powder Commonly known as: MYCOSTATIN/NYSTOP Apply 1 Application topically 3 (three) times daily.   sitaGLIPtin 100 MG tablet Commonly known as: Januvia Take 1 tablet (100 mg total) by mouth daily.   solifenacin 10 MG tablet Commonly known as: VESICARE TAKE 1/2 TABLET BY MOUTH DAILY   sulfamethoxazole-trimethoprim 800-160 MG tablet Commonly known as: BACTRIM DS Take 1 tablet by mouth every 12 (twelve) hours for 5 doses. What changed: when to take this   triamcinolone cream 0.1 % Commonly known as: KENALOG APPLY TO AFFECTED AREA TWICE A DAY What changed: See the new instructions.        Contact information for follow-up providers     Baxley, Luanna Cole, MD Follow up in 1 week(s).   Specialty: Internal Medicine Contact information: [address] [place]  [address] [phone]              Contact information for after-discharge care     Destination     [facility] .   Service: Skilled Nursing Contact information: [address] [place] [address] [phone]                    Allergies  Allergen Reactions   Adhesive [Tape] Rash   Polysporin [Bacitracin-Polymyxin B] Rash    Consultations: None   Procedures/Studies: CT HEAD WO CONTRAST ( )  Result Date: 08/02/2022 CLINICAL DATA:  Encephalopathy, fall 2 weeks ago, knot on back of head. EXAM: CT HEAD WITHOUT CONTRAST TECHNIQUE: Contiguous axial images were obtained from the base of the skull through the vertex without intravenous contrast. RADIATION DOSE REDUCTION: This exam was performed according to the departmental dose-optimization program which includes automated exposure control,  adjustment of the mA and/or kV according to patient size and/or use of iterative reconstruction technique. COMPARISON:  CT dated 06/22/2022. FINDINGS: Brain: Generalized age related parenchymal volume loss with commensurate dilatation of the ventricles and sulci. Chronic small vessel ischemic changes within the bilateral periventricular and subcortical white matter regions. Small old lacunar infarct within the RIGHT basal ganglia region. No mass, hemorrhage, edema or other evidence of acute parenchymal abnormality. No extra-axial hemorrhage. Vascular: No hyperdense vessel or unexpected calcification. Skull: Normal. Negative for fracture or focal lesion. Sinuses/Orbits: No acute finding. Other: None. IMPRESSION: 1. No acute findings. No intracranial mass, hemorrhage or edema. No skull fracture. 2. Chronic small vessel ischemic changes, as detailed above. Electronically Signed   By: Bary Richard M.D.   On: 08/02/2022 14:11   CT ABDOMEN PELVIS W WO CONTRAST  Result Date: 08/01/2022 CLINICAL DATA:  Concern for urinary outlet obstruction, prolonged constipation and leukocytosis. EXAM: CT ABDOMEN AND PELVIS WITHOUT AND WITH CONTRAST TECHNIQUE: Multidetector CT imaging of the abdomen and pelvis was performed following the standard protocol before and following the bolus administration of intravenous contrast. RADIATION DOSE REDUCTION: This exam was performed according to the departmental dose-optimization program which includes automated exposure control, adjustment of the mA and/or kV according to patient size and/or use of iterative reconstruction technique. CONTRAST:  OMNIPAQUE IOHEXOL 300 MG/ML  SOLN COMPARISON:  None Available. FINDINGS: Lower chest: Mild atelectasis is noted at the lung bases. Hepatobiliary: No focal abnormality in the liver. Fatty infiltration of the liver is noted. No biliary ductal dilatation. Hyperdense material is present in the gallbladder, possible stones or sludge. Pancreas:  Unremarkable. No pancreatic ductal dilatation or surrounding inflammatory changes. Spleen: Normal in size without focal abnormality. Adrenals/Urinary Tract: The adrenal glands are within normal limits. Perinephric edema is noted bilaterally. There is a cyst in the upper pole of the right kidney. A nonobstructive calculus is noted on the right. Patchy hypoenhancement is noted in the upper and lower poles of the left kidney. No ureteral calculus or obstructive uropathy bilaterally. Bladder is unremarkable. Stomach/Bowel: There is a small hiatal hernia. Stomach is within normal limits. Appendix appears normal. No evidence of bowel wall thickening, distention, or inflammatory changes. No free air or pneumatosis. Few scattered diverticula are present along the colon without evidence of diverticulitis. Vascular/Lymphatic: Aortic atherosclerosis. Nonspecific prominent lymph nodes are present at the porta hepatis measuring up to 1.2 cm. Prominent lymph nodes are noted in the left periaortic space at the level of the left kidney measuring 1 cm. Reproductive: Status post hysterectomy. There is a cyst in the right adnexa  measuring 2.9 cm. A cyst is present in the left adnexa measuring 1.9 cm. No follow-up imaging is recommended. Other: No abdominopelvic ascites. A small fat containing umbilical hernia is present. Musculoskeletal: Degenerative changes are present in the thoracolumbar spine. No acute or suspicious osseous abnormality. IMPRESSION: 1. Patchy hypoenhancement of the left kidney, concerning for pyelonephritis. 2. Nonobstructive right renal calculus. 3. Hepatic steatosis. 4. Hyperdense material in the gallbladder, possible stones or sludge. 5. Small hiatal hernia. 6. Aortic atherosclerosis. 7. Simple cysts in the ovaries bilaterally, the largest measuring 2.9 cm, almost certainly benign. Yearly follow-up with ultrasound is recommended until resolved. Electronically Signed   By: Thornell Sartorius M.D.   On: 08/01/2022  21:44   DG Chest Portable 1 View  Result Date: 08/01/2022 CLINICAL DATA:  Altered mental status. Recent urinary tract infection. EXAM: PORTABLE CHEST 1 VIEW COMPARISON:  One-view chest x-ray 07/29/2022 FINDINGS: The heart is enlarged. Mild pulmonary vascular congestion present. Lung volumes are low. No focal airspace consolidation present. No edema or effusion is present IMPRESSION: Cardiomegaly and mild pulmonary vascular congestion. Electronically Signed   By: Marin Roberts M.D.   On: 08/01/2022 16:28   DG Knee Right Port  Result Date: 07/29/2022 CLINICAL DATA:  Right knee pain EXAM: PORTABLE RIGHT KNEE - 1-2 VIEW COMPARISON:  None Available. FINDINGS: Early osteoarthritis with joint space narrowing most notable in the medial compartment and spurring most notable in the patellofemoral compartment. No acute bony abnormality. Specifically, no fracture, subluxation, or dislocation. No joint effusion. IMPRESSION: Early osteoarthritis.  No acute bony abnormality. Electronically Signed   By: Charlett Nose M.D.   On: 07/29/2022 19:19   DG Pelvis Portable  Result Date: 07/29/2022 CLINICAL DATA:  Weakness, pelvic pain. EXAM: PORTABLE PELVIS 1-2 VIEWS COMPARISON:  None Available. FINDINGS: No acute bony abnormality. Specifically, no fracture, subluxation, or dislocation. Hip joints and SI joints symmetric. IMPRESSION: No acute bony abnormality. Electronically Signed   By: Charlett Nose M.D.   On: 07/29/2022 19:18   DG Chest Port 1 View  Result Date: 07/29/2022 CLINICAL DATA:  Weakness EXAM: PORTABLE CHEST 1 VIEW COMPARISON:  10/09/2010 FINDINGS: The heart size and mediastinal contours are within normal limits. Both lungs are clear. The visualized skeletal structures are unremarkable. IMPRESSION: No active disease. Electronically Signed   By: Charlett Nose M.D.   On: 07/29/2022 19:17     Subjective: Patient was seen and examined at bedside.  Overnight events noted.   Patient reports doing much  better.  She wants to be discharged.  Discharge Exam: Vitals:   08/06/22 0558 08/06/22 1325  BP: (!) 151/93 (!) 141/89  Pulse: 100   Resp: 17 17  Temp: 97.8 F (36.6 C) 97.7 F (36.5 C)  SpO2: 93% 96%   Vitals:   08/05/22 2144 08/06/22 0500 08/06/22 0558 08/06/22 1325  BP: (!) 143/104  (!) 151/93 (!) 141/89  Pulse: 98  100   Resp: 16  17 17   Temp: 97.6 F (36.4 C)  97.8 F (36.6 C) 97.7 F (36.5 C)  TempSrc: Oral  Oral   SpO2: 92%  93% 96%  Weight:  112.6 kg    Height:        General: Pt is alert, awake, not in acute distress Cardiovascular: RRR, S1/S2 +, no rubs, no gallops Respiratory: CTA bilaterally, no wheezing, no rhonchi Abdominal: Soft, NT, ND, bowel sounds + Extremities: no edema, no cyanosis    The results of significant diagnostics from this hospitalization (including imaging, microbiology, ancillary and  laboratory) are listed below for reference.     Microbiology: Recent Results (from the past 240 hour(s))  Resp panel by RT-PCR (RSV, Flu A&B, Covid) Anterior Nasal Swab     Status: None   Collection Time: 07/29/22  5:05 PM   Specimen: Anterior Nasal Swab  Result Value Ref Range Status   SARS Coronavirus 2 by RT PCR NEGATIVE NEGATIVE Final    Comment: (NOTE) SARS-CoV-2 target nucleic acids are NOT DETECTED.  The SARS-CoV-2 RNA is generally detectable in upper respiratory specimens during the acute phase of infection. The lowest concentration of SARS-CoV-2 viral copies this assay can detect is 138 copies/mL. A negative result does not preclude SARS-Cov-2 infection and should not be used as the sole basis for treatment or other patient management decisions. A negative result may occur with  improper specimen collection/handling, submission of specimen other than nasopharyngeal swab, presence of viral mutation(s) within the areas targeted by this assay, and inadequate number of viral copies(<138 copies/mL). A negative result must be combined  with clinical observations, patient history, and epidemiological information. The expected result is Negative.  Fact Sheet for Patients:  BloggerCourse.com  Fact Sheet for Healthcare Providers:  SeriousBroker.it  This test is no t yet approved or cleared by the [place] FDA and  has been authorized for detection and/or diagnosis of SARS-CoV-2 by FDA under an Emergency Use Authorization (EUA). This EUA will remain  in effect (meaning this test can be used) for the duration of the COVID-19 declaration under Section 564(b)(1) of the Act, 21 U.S.C.section 360bbb-3(b)(1), unless the authorization is terminated  or revoked sooner.       Influenza A by PCR NEGATIVE NEGATIVE Final   Influenza B by PCR NEGATIVE NEGATIVE Final    Comment: (NOTE) The Xpert Xpress SARS-CoV-2/FLU/RSV plus assay is intended as an aid in the diagnosis of influenza from Nasopharyngeal swab specimens and should not be used as a sole basis for treatment. Nasal washings and aspirates are unacceptable for Xpert Xpress SARS-CoV-2/FLU/RSV testing.  Fact Sheet for Patients: BloggerCourse.com  Fact Sheet for Healthcare Providers: SeriousBroker.it  This test is not yet approved or cleared by the [place] FDA and has been authorized for detection and/or diagnosis of SARS-CoV-2 by FDA under an Emergency Use Authorization (EUA). This EUA will remain in effect (meaning this test can be used) for the duration of the COVID-19 declaration under Section 564(b)(1) of the Act, 21 U.S.C. section 360bbb-3(b)(1), unless the authorization is terminated or revoked.     Resp Syncytial Virus by PCR NEGATIVE NEGATIVE Final    Comment: (NOTE) Fact Sheet for Patients: BloggerCourse.com  Fact Sheet for Healthcare Providers: SeriousBroker.it  This test is not yet approved  or cleared by the [place] FDA and has been authorized for detection and/or diagnosis of SARS-CoV-2 by FDA under an Emergency Use Authorization (EUA). This EUA will remain in effect (meaning this test can be used) for the duration of the COVID-19 declaration under Section 564(b)(1) of the Act, 21 U.S.C. section 360bbb-3(b)(1), unless the authorization is terminated or revoked.  Performed at [facility], [address], [place], [place] [address]   Culture, blood (routine x 2)     Status: None   Collection Time: 08/01/22  4:00 PM   Specimen: BLOOD RIGHT HAND  Result Value Ref Range Status   Specimen Description   Final    BLOOD RIGHT HAND Performed at [facility], [address], [place], [place] [address]  Special Requests   Final    BOTTLES DRAWN AEROBIC AND ANAEROBIC Blood Culture adequate volume Performed at [facility], [address], [place], [place] [address]    Culture   Final    NO GROWTH 5 DAYS Performed at [facility], [address] [place], [place] [address]    Report Status 08/06/2022 FINAL  Final  Culture, blood (routine x 2)     Status: None   Collection Time: 08/01/22  4:41 PM   Specimen: BLOOD RIGHT ARM  Result Value Ref Range Status   Specimen Description   Final    BLOOD RIGHT ARM Performed at [facility], [address], [place], [place] [address]    Special Requests   Final    BOTTLES DRAWN AEROBIC AND ANAEROBIC Blood Culture results may not be optimal due to an excessive volume of blood received in culture bottles Performed at [facility], [address], [place], [place] [address]    Culture   Final    NO GROWTH 5 DAYS Performed at [facility], [address], [place], [place] [address]    Report Status 08/06/2022 FINAL  Final  SARS Coronavirus 2 by RT PCR (hospital order, performed in [facility] hospital lab) *cepheid single  result test* Anterior Nasal Swab     Status: None   Collection Time: 08/01/22  7:08 PM   Specimen: Anterior Nasal Swab  Result Value Ref Range Status   SARS Coronavirus 2 by RT PCR NEGATIVE NEGATIVE Final    Comment: (NOTE) SARS-CoV-2 target nucleic acids are NOT DETECTED.  The SARS-CoV-2 RNA is generally detectable in upper and lower respiratory specimens during the acute phase of infection. The lowest concentration of SARS-CoV-2 viral copies this assay can detect is 250 copies / mL. A negative result does not preclude SARS-CoV-2 infection and should not be used as the sole basis for treatment or other patient management decisions.  A negative result may occur with improper specimen collection / handling, submission of specimen other than nasopharyngeal swab, presence of viral mutation(s) within the areas targeted by this assay, and inadequate number of viral copies (<250 copies / mL). A negative result must be combined with clinical observations, patient history, and epidemiological information.  Fact Sheet for Patients:   RoadLapTop.co.za  Fact Sheet for Healthcare Providers: http://kim-miller.com/  This test is not yet approved or  cleared by the [place] FDA and has been authorized for detection and/or diagnosis of SARS-CoV-2 by FDA under an Emergency Use Authorization (EUA).  This EUA will remain in effect (meaning this test can be used) for the duration of the COVID-19 declaration under Section 564(b)(1) of the Act, 21 U.S.C. section 360bbb-3(b)(1), unless the authorization is terminated or revoked sooner.  Performed at [facility], [address] [place], [place] [address]   Urine Culture (for pregnant, neutropenic or urologic patients or patients with an indwelling urinary catheter)     Status: Abnormal   Collection Time: 08/01/22  9:57 PM   Specimen: Urine, Clean Catch  Result Value Ref Range  Status   Specimen Description   Final    URINE, CLEAN CATCH Performed at [facility], [address] [place], [place] [address]    Special Requests   Final    NONE Performed at [facility], [address] [place], [place] [address]    Culture MULTIPLE SPECIES PRESENT, SUGGEST RECOLLECTION (A)  Final  Report Status 08/03/2022 FINAL  Final     Labs: BNP (last 3 results) Recent Labs    08/01/22 2303  BNP 158.3*   Basic Metabolic Panel: Recent Labs  Lab 08/01/22 1804 08/01/22 2303 08/02/22 0510 08/04/22 0338 08/06/22 0332  NA 138  --  139 139 136  K 4.0  --  3.5 3.5 3.6  CL 101  --  105 104 100  CO2 26  --  24 25 25   GLUCOSE 161*  --  160* 186* 196*  BUN 12  --  8 8 8   CREATININE 0.66  --  0.60 0.64 0.69  CALCIUM 8.9  --  8.4* 8.5* 9.0  MG  --  1.7  --  1.9  --   PHOS  --  2.8  --  2.8  --    Liver Function Tests: Recent Labs  Lab 08/01/22 1804 08/02/22 0510  AST 50* 33  ALT 25 23  ALKPHOS 93 72  BILITOT 1.1 0.9  PROT 7.3 5.9*  ALBUMIN 3.7 3.0*   No results for input(s): "LIPASE", "AMYLASE" in the last 168 hours. No results for input(s): "AMMONIA" in the last 168 hours. CBC: Recent Labs  Lab 08/01/22 1600 08/02/22 0510  WBC 16.5* 9.8  NEUTROABS 14.2*  --   HGB 17.0* 15.0  HCT 53.5* 46.2*  MCV 95.9 92.4  PLT 260 247   Cardiac Enzymes: Recent Labs  Lab 08/01/22 1804 08/02/22 0510 08/05/22 0842  CKTOTAL 1,562* 648* 94   BNP: Invalid input(s): "POCBNP" CBG: Recent Labs  Lab 08/05/22 1112 08/05/22 1632 08/05/22 2145 08/06/22 0731 08/06/22 1136  GLUCAP 244* 221* 192* 205* 208*   D-Dimer No results for input(s): "DDIMER" in the last 72 hours. Hgb A1c No results for input(s): "HGBA1C" in the last 72 hours. Lipid Profile No results for input(s): "CHOL", "HDL", "LDLCALC", "TRIG", "CHOLHDL", "LDLDIRECT" in the last 72 hours. Thyroid function studies No results for input(s): "TSH", "T4TOTAL",  "T3FREE", "THYROIDAB" in the last 72 hours.  Invalid input(s): "FREET3" Anemia work up No results for input(s): "VITAMINB12", "FOLATE", "FERRITIN", "TIBC", "IRON", "RETICCTPCT" in the last 72 hours. Urinalysis    Component Value Date/Time   COLORURINE YELLOW 08/01/2022 1508   APPEARANCEUR CLOUDY (A) 08/01/2022 1508   LABSPEC 1.015 08/01/2022 1508   PHURINE 5.0 08/01/2022 1508   GLUCOSEU 50 (A) 08/01/2022 1508   HGBUR MODERATE (A) 08/01/2022 1508   BILIRUBINUR NEGATIVE 08/01/2022 1508   BILIRUBINUR neg 07/20/2022 1537   KETONESUR 80 (A) 08/01/2022 1508   PROTEINUR 100 (A) 08/01/2022 1508   UROBILINOGEN 0.2 07/20/2022 1537   NITRITE NEGATIVE 08/01/2022 1508   LEUKOCYTESUR SMALL (A) 08/01/2022 1508   Sepsis Labs Recent Labs  Lab 08/01/22 1600 08/02/22 0510  WBC 16.5* 9.8   Microbiology Recent Results (from the past 240 hour(s))  Resp panel by RT-PCR (RSV, Flu A&B, Covid) Anterior Nasal Swab     Status: None   Collection Time: 07/29/22  5:05 PM   Specimen: Anterior Nasal Swab  Result Value Ref Range Status   SARS Coronavirus 2 by RT PCR NEGATIVE NEGATIVE Final    Comment: (NOTE) SARS-CoV-2 target nucleic acids are NOT DETECTED.  The SARS-CoV-2 RNA is generally detectable in upper respiratory specimens during the acute phase of infection. The lowest concentration of SARS-CoV-2 viral copies this assay can detect is 138 copies/mL. A negative result does not preclude SARS-Cov-2 infection and should not be used as the sole basis for treatment or other patient management decisions. A negative  result may occur with  improper specimen collection/handling, submission of specimen other than nasopharyngeal swab, presence of viral mutation(s) within the areas targeted by this assay, and inadequate number of viral copies(<138 copies/mL). A negative result must be combined with clinical observations, patient history, and epidemiological information. The expected result is  Negative.  Fact Sheet for Patients:  BloggerCourse.com  Fact Sheet for Healthcare Providers:  SeriousBroker.it  This test is no t yet approved or cleared by the [place] FDA and  has been authorized for detection and/or diagnosis of SARS-CoV-2 by FDA under an Emergency Use Authorization (EUA). This EUA will remain  in effect (meaning this test can be used) for the duration of the COVID-19 declaration under Section 564(b)(1) of the Act, 21 U.S.C.section 360bbb-3(b)(1), unless the authorization is terminated  or revoked sooner.       Influenza A by PCR NEGATIVE NEGATIVE Final   Influenza B by PCR NEGATIVE NEGATIVE Final    Comment: (NOTE) The Xpert Xpress SARS-CoV-2/FLU/RSV plus assay is intended as an aid in the diagnosis of influenza from Nasopharyngeal swab specimens and should not be used as a sole basis for treatment. Nasal washings and aspirates are unacceptable for Xpert Xpress SARS-CoV-2/FLU/RSV testing.  Fact Sheet for Patients: BloggerCourse.com  Fact Sheet for Healthcare Providers: SeriousBroker.it  This test is not yet approved or cleared by the [place] FDA and has been authorized for detection and/or diagnosis of SARS-CoV-2 by FDA under an Emergency Use Authorization (EUA). This EUA will remain in effect (meaning this test can be used) for the duration of the COVID-19 declaration under Section 564(b)(1) of the Act, 21 U.S.C. section 360bbb-3(b)(1), unless the authorization is terminated or revoked.     Resp Syncytial Virus by PCR NEGATIVE NEGATIVE Final    Comment: (NOTE) Fact Sheet for Patients: BloggerCourse.com  Fact Sheet for Healthcare Providers: SeriousBroker.it  This test is not yet approved or cleared by the [place] FDA and has been authorized for detection and/or diagnosis of  SARS-CoV-2 by FDA under an Emergency Use Authorization (EUA). This EUA will remain in effect (meaning this test can be used) for the duration of the COVID-19 declaration under Section 564(b)(1) of the Act, 21 U.S.C. section 360bbb-3(b)(1), unless the authorization is terminated or revoked.  Performed at [facility], [address], [place], [place] [address]   Culture, blood (routine x 2)     Status: None   Collection Time: 08/01/22  4:00 PM   Specimen: BLOOD RIGHT HAND  Result Value Ref Range Status   Specimen Description   Final    BLOOD RIGHT HAND Performed at [facility], [address], [place], [place] [address]    Special Requests   Final    BOTTLES DRAWN AEROBIC AND ANAEROBIC Blood Culture adequate volume Performed at [facility], [address], [place], [place] [address]    Culture   Final    NO GROWTH 5 DAYS Performed at [facility], [address] [place], [place] [address]    Report Status 08/06/2022 FINAL  Final  Culture, blood (routine x 2)     Status: None   Collection Time: 08/01/22  4:41 PM   Specimen: BLOOD RIGHT ARM  Result Value Ref Range Status   Specimen Description   Final    BLOOD RIGHT ARM Performed at [facility], [address], [place], [place] [address]    Special Requests   Final    BOTTLES  DRAWN AEROBIC AND ANAEROBIC Blood Culture results may not be optimal due to an excessive volume of blood received in culture bottles Performed at [facility], [address] [place], [place] [address]    Culture   Final    NO GROWTH 5 DAYS Performed at [facility], [address], [place], [place] [address]    Report Status 08/06/2022 FINAL  Final  SARS Coronavirus 2 by RT PCR (hospital order, performed in [facility] hospital lab) *cepheid single result test* Anterior Nasal Swab     Status: None   Collection Time: 08/01/22  7:08 PM    Specimen: Anterior Nasal Swab  Result Value Ref Range Status   SARS Coronavirus 2 by RT PCR NEGATIVE NEGATIVE Final    Comment: (NOTE) SARS-CoV-2 target nucleic acids are NOT DETECTED.  The SARS-CoV-2 RNA is generally detectable in upper and lower respiratory specimens during the acute phase of infection. The lowest concentration of SARS-CoV-2 viral copies this assay can detect is 250 copies / mL. A negative result does not preclude SARS-CoV-2 infection and should not be used as the sole basis for treatment or other patient management decisions.  A negative result may occur with improper specimen collection / handling, submission of specimen other than nasopharyngeal swab, presence of viral mutation(s) within the areas targeted by this assay, and inadequate number of viral copies (<250 copies / mL). A negative result must be combined with clinical observations, patient history, and epidemiological information.  Fact Sheet for Patients:   RoadLapTop.co.za  Fact Sheet for Healthcare Providers: http://kim-miller.com/  This test is not yet approved or  cleared by the [place] FDA and has been authorized for detection and/or diagnosis of SARS-CoV-2 by FDA under an Emergency Use Authorization (EUA).  This EUA will remain in effect (meaning this test can be used) for the duration of the COVID-19 declaration under Section 564(b)(1) of the Act, 21 U.S.C. section 360bbb-3(b)(1), unless the authorization is terminated or revoked sooner.  Performed at [facility], [address], [place], [place] [address]   Urine Culture (for pregnant, neutropenic or urologic patients or patients with an indwelling urinary catheter)     Status: Abnormal   Collection Time: 08/01/22  9:57 PM   Specimen: Urine, Clean Catch  Result Value Ref Range Status   Specimen Description   Final    URINE, CLEAN CATCH Performed at [facility], [address], [place], [place] [address]    Special Requests   Final    NONE Performed at [facility], [address], [place], [place] [address]    Culture MULTIPLE SPECIES PRESENT, SUGGEST RECOLLECTION (A)  Final   Report Status 08/03/2022 FINAL  Final     Time coordinating discharge: Over 30 minutes  SIGNED:   Willeen Niece, MD  [facility] 08/06/2022, 2:50 PM Pager   If 7PM-7AM, please contact night-coverage.

## 2022-08-06 NOTE — TOC Transition Note (Signed)
Transition of Care [facility]) - CM/SW Discharge Note   Patient Details  Name: Ebony Ashley MRN: [id number] Date of Birth: [birth year]  Transition of Care [facility]) CM/SW Contact:  Amada Jupiter, LCSW Phone Number: 08/06/2022, 1:13 PM   Clinical Narrative:     Pt and son accepted SN[facility] and pt is medically cleared for dc today.  PTAR called at 1:10pm.  RN to call report to [phone].  No further TOC needs.  Final next level of care: Skilled Nursing Facility Barriers to Discharge: Barriers Resolved   Patient Goals and CMS Choice      Discharge Placement PASRR number recieved: 08/03/22 PASRR number recieved: 08/03/22            [facility] Patient to be transferred to facility by: [facility] Name of family member notified: son, Mellody Dance Patient and family notified of of transfer: 08/06/22  Discharge Plan and Services Additional resources added to the After Visit Summary for   In-house Referral: Clinical Social Work   Post Acute Care Choice: Skilled Nursing Facility          DME Arranged: N/A DME Agency: NA                  Social Determinants of Health (SDOH) Interventions SDOH Screenings   Food Insecurity: No Food Insecurity (08/01/2022)  Housing: Low Risk  (08/01/2022)  Transportation Needs: No Transportation Needs (08/01/2022)  Utilities: Not At Risk (08/01/2022)  Depression (PHQ2-9): Low Risk  (07/16/2022)  Social Connections: Unknown (05/27/2022)   [facility]  Tobacco Use: Low Risk  (08/01/2022)     Readmission Risk Interventions    08/03/2022   11:52 AM  Readmission Risk Prevention Plan  Transportation Screening Complete  PCP or Specialist Appt within 5-7 Days Complete  Home Care Screening Complete  Medication Review (RN CM) Complete

## 2022-08-09 NOTE — Progress Notes (Signed)
Patient here with weakness

## 2022-08-11 NOTE — Telephone Encounter (Signed)
This patient is now seeing an Dr Ocie Cornfield an Endocrinologist and she is in a skilled nursing facility. So it would be up to them to do an appeal

## 2022-08-13 ENCOUNTER — Ambulatory Visit: Payer: PPO | Admitting: Internal Medicine

## 2022-08-24 ENCOUNTER — Ambulatory Visit: Payer: Self-pay

## 2022-08-24 NOTE — Patient Outreach (Signed)
  Care Coordination   08/24/2022 Name: Ebony Ashley MRN: [id number] DOB: [birth year]   Care Coordination Outreach Attempts:  An unsuccessful telephone outreach was attempted for a scheduled appointment today.  Follow Up Plan:  Additional outreach attempts will be made to offer the patient care coordination information and services.   Encounter Outcome:  No Answer   Care Coordination Interventions:  No, not indicated    Delsa Sale, RN, BSN, CCM Care Management Coordinator [facility] Care Management Direct Phone: [phone]

## 2022-08-24 NOTE — Patient Instructions (Signed)
Visit Information  Thank you for taking time to visit with me today. Please don't hesitate to contact me if I can be of assistance to you.   Following are the goals we discussed today:   Goals Addressed               This Visit's Progress     Patient Stated     To pay more attention to dietary intake (pt-stated)        Care Coordination Interventions: Evaluation of current treatment plan related to type 2 diabetes mellitus and patient's adherence to plan as established by provider Determined patient missed her follow up with Dr. Roanna Raider due to being inpatient at acute rehab during her appointment time Discussed she will reschedule her appointment once her son is back in her home with her to help determine if he will accompany her to the next scheduled appointment  Discussed patient continues not to self monitor her sugars, she has not yet taken her am medication for diabetes Educated patient on the importance of taking her medications exactly as prescribed and stressed the importance of medication adherence  Determined patient is agreeable to a call back once her son is available to help reschedule her PCP and Endocrinology visits as well as go over her medications  Assessed patient's understanding of A1c goal:  <7.0 % Lab Results  Component Value Date   HGBA1C 8.2 (H) 08/01/2022       Other     COMPLETED: RN Care Coordination Activities: further follow up needed        Care Coordination Interventions: Determined patient has been discharged home from acute skilled nursing facility following a fall, recurrent UTI and dehydration occurring in late July (see other goals)      To avoid having further falls        Care Coordination Interventions: Completed call with patient Determined patient has been discharged to her home from acute skilled nursing facility following a fall, recurrent UTI and dehydration that occurred in late July Determined patient's son Mellody Dance will be staying with  her for a while but she may end up moving to [place] to live with him in the future, he is currently not present due to having some health issues of his own, he is due to return soon Provided written and verbal education re: potential causes of falls and Fall prevention strategies Advised patient of importance of notifying provider of falls Assessed for falls since last encounter Assessed patients knowledge of fall risk prevention secondary to previously provided education Provided patient information for fall alert systems Assessed working status of life alert bracelet and patient adherence         Our next appointment is by telephone on 09/01/22 at 1:00 PM   Please call the care guide team at [phone] if you need to cancel or reschedule your appointment.   If you are experiencing a Mental Health or Behavioral Health Crisis or need someone to talk to, please call 1-800-273-TALK (toll free, 24 hour hotline)  The patient verbalized understanding of instructions, educational materials, and care plan provided today and DECLINED offer to receive copy of patient instructions, educational materials, and care plan.   Delsa Sale, RN, BSN, CCM Care Management Coordinator [facility] Care Management Direct Phone: [phone]

## 2022-08-24 NOTE — Patient Outreach (Signed)
Care Coordination   Follow Up Visit Note   08/24/2022 Name: Ebony Ashley MRN: [id number] DOB: 1945/12/07  Ebony Ashley is a 77 y.o. year old female who sees Baxley, Ebony Cole, MD for primary care. I spoke with  Ebony Ashley by phone today.  What matters to the patients health and wellness today?  Patient would like to avoid having additional falls.    Goals Addressed               This Visit's Progress     Patient Stated     To pay more attention to dietary intake (pt-stated)        Care Coordination Interventions: Evaluation of current treatment plan related to type 2 diabetes mellitus and patient's adherence to plan as established by provider Determined patient missed her follow up with Dr. Roanna Ashley due to being inpatient at acute rehab during her appointment time Discussed she will reschedule her appointment once her son is back in her home with her to help determine if he will accompany her to the next scheduled appointment  Discussed patient continues not to self monitor her sugars, she has not yet taken her am medication for diabetes Educated patient on the importance of taking her medications exactly as prescribed and stressed the importance of medication adherence  Determined patient is agreeable to a call back once her son is available to help reschedule her PCP and Endocrinology visits as well as go over her medications  Assessed patient's understanding of A1c goal:  <7.0 % Lab Results  Component Value Date   HGBA1C 8.2 (H) 08/01/2022       Other     COMPLETED: RN Care Coordination Activities: further follow up needed        Care Coordination Interventions: Determined patient has been discharged home from acute skilled nursing facility following a fall, recurrent UTI and dehydration occurring in late July (see other goals)      To avoid having further falls        Care Coordination Interventions: Completed call with patient Determined patient has been  discharged to her home from acute skilled nursing facility following a fall, recurrent UTI and dehydration that occurred in late July Determined patient's son Ebony Ashley will be staying with her for a while but she may end up moving to [place] to live with him in the future, he is currently not present due to having some health issues of his own, he is due to return soon Provided written and verbal education re: potential causes of falls and Fall prevention strategies Advised patient of importance of notifying provider of falls Assessed for falls since last encounter Assessed patients knowledge of fall risk prevention secondary to previously provided education Provided patient information for fall alert systems Assessed working status of life alert bracelet and patient adherence     Interventions Today    Flowsheet Row Most Recent Value  Chronic Disease   Chronic disease during today's visit Other, Diabetes, Hypertension (HTN)  [s/p fall]  General Interventions   General Interventions Discussed/Reviewed General Interventions Discussed, General Interventions Reviewed, Doctor Visits  Doctor Visits Discussed/Reviewed Doctor Visits Reviewed, Doctor Visits Discussed, Specialist, PCP  Education Interventions   Education Provided Provided Education  Pharmacy Interventions   Pharmacy Dicussed/Reviewed Pharmacy Topics Discussed, Pharmacy Topics Reviewed  Safety Interventions   Safety Discussed/Reviewed Home Safety, Fall Risk, Safety Reviewed, Safety Discussed  Home Safety Assistive Devices          SDOH assessments and  interventions completed:  No     Care Coordination Interventions:  Yes, provided   Follow up plan: Follow up call scheduled for 09/01/22 @1 :00 PM     Encounter Outcome:  Pt. Visit Completed

## 2022-09-01 ENCOUNTER — Ambulatory Visit: Payer: Self-pay

## 2022-09-01 NOTE — Patient Outreach (Signed)
  Care Coordination   09/01/2022 Name: Ebony Ashley MRN: [id number] DOB: [birth year]   Care Coordination Outreach Attempts:  An unsuccessful telephone outreach was attempted for a scheduled appointment today.  Follow Up Plan:  Additional outreach attempts will be made to offer the patient care coordination information and services.   Encounter Outcome:  No Answer   Care Coordination Interventions:  No, not indicated    Delsa Sale, RN, BSN, CCM Care Management Coordinator [facility] Care Management  Direct Phone: [phone]

## 2022-09-03 ENCOUNTER — Telehealth: Payer: Self-pay | Admitting: *Deleted

## 2022-09-03 NOTE — Progress Notes (Unsigned)
  Care Coordination Note  09/03/2022 Name: Ebony Ashley MRN: [id number] DOB: 1945-02-18  Ebony Ashley is a 77 y.o. year old female who is a primary care patient of Baxley, Luanna Cole, MD and is actively engaged with the care management team. I reached out to [name] by phone today to assist with re-scheduling a follow up visit with the RN Case Manager  Follow up plan: Unsuccessful telephone outreach attempt made. A HIPAA compliant phone message was left for the patient providing contact information and requesting a return call.   [facility]  Care Coordination Care Guide  Direct Dial: [phone]

## 2022-09-07 ENCOUNTER — Telehealth: Payer: Self-pay | Admitting: Internal Medicine

## 2022-09-07 NOTE — Telephone Encounter (Signed)
Ebony Ashley called back, he had no idea that his mom needed to come in for a hospital follow up. So we scheduled her for hospital / rehab follow up Monday 09/13/2022. He was not sure when she was discharged. He is having some health issues too, but will have her here on Monday.

## 2022-09-07 NOTE — Telephone Encounter (Signed)
Floydene Flock RN, [facility] [phone]  Floydene Flock called she is an Charity fundraiser with [employer] to get verbal orders from Dr Iona Hansen for a UA, because patient is complain of UTI symptoms.  This is the first we had heard that patient was not still in rehab, I spoke with her first and she let me know that the papers she has says that patient was discharged on the 08/22/2022. Floydene Flock had explained how important to the patient it was for her to go into the office for her hospital follow up, she said she was not able to do that.  Dr Lenord Fellers spoke with Floydene Flock and gave her a verbal order to try and get a clean catch, they also talked about the importance of the patient coming into the office for a hospital follow up.

## 2022-09-07 NOTE — Telephone Encounter (Signed)
LVM to CB, that we needed to schedule a hospital / rehab follow up with his mom. That we had know idea that she had been discharged to go home.

## 2022-09-07 NOTE — Progress Notes (Signed)
  Care Coordination Note  09/07/2022 Name: MIKHAELA GIORGI MRN: [id number] DOB: September 20, 1945  [name] is a 77 y.o. year old female who is a primary care patient of Baxley, Luanna Cole, MD and is actively engaged with the care management team. I reached out to [name] by phone today to assist with re-scheduling a follow up visit with the RN Case Manager  Follow up plan: Telephone appointment with care management team member scheduled for:09/15/22  [facility] Coordination Care Guide  Direct Dial: [phone]

## 2022-09-10 NOTE — Telephone Encounter (Addendum)
Transition Care Management Follow-up Telephone Call Date of discharge and from where: 08/29/22 [facility] /  How have you been since you were released from the hospital? Feels fine  Any questions or concerns? No   Items Reviewed: Did the pt receive and understand the discharge instructions provided?Medications obtained and verified? Yes  Other?  Any new allergies since your discharge? no Dietary orders reviewed? yes Do you have support at home? No   Home Care and Equipment/Supplies: Were home health services ordered? Yes If so, what is the name of the agency? [facility]    Has the agency set up a time to come to the patient's home? yes Were any new equipment or medical supplies ordered? yes What is the name of the medical supply agency? [facility]   Were you able to get the supplies/equipment? yes  Do you have any questions related to the use of the equipment or supplies? no    Functional Questionnaire: (I = Independent and D = Dependent) ADLs: I    Bathing/Dressing- I   Meal Prep- I   Eating-  I Maintaining continence- I    Transferring/Ambulation- I   Managing Meds- I   Follow up appointments reviewed:   PCP Hospital f/u appt confirmed? yes, 09/13/22 at 12:00pm Specialist Hospital f/u appt confirmed? no, patient stated she will call to book.  Are transportation arrangements needed? no  If their condition worsens, is the pt aware to call PCP or go to the Emergency Dept.? Yes  Was the patient provided with contact information for the PCP's office or ED? yes  Was to pt encouraged to call back with questions or concerns? Yes

## 2022-09-13 ENCOUNTER — Encounter: Payer: Self-pay | Admitting: Internal Medicine

## 2022-09-13 ENCOUNTER — Ambulatory Visit (INDEPENDENT_AMBULATORY_CARE_PROVIDER_SITE_OTHER): Payer: PPO | Admitting: Internal Medicine

## 2022-09-13 VITALS — BP 120/80 | HR 76 | Temp 99.0°F | Ht 61.25 in | Wt 245.0 lb

## 2022-09-13 DIAGNOSIS — I4891 Unspecified atrial fibrillation: Secondary | ICD-10-CM

## 2022-09-13 DIAGNOSIS — Z8659 Personal history of other mental and behavioral disorders: Secondary | ICD-10-CM

## 2022-09-13 DIAGNOSIS — E1169 Type 2 diabetes mellitus with other specified complication: Secondary | ICD-10-CM | POA: Diagnosis not present

## 2022-09-13 DIAGNOSIS — N3281 Overactive bladder: Secondary | ICD-10-CM

## 2022-09-13 DIAGNOSIS — E782 Mixed hyperlipidemia: Secondary | ICD-10-CM

## 2022-09-13 DIAGNOSIS — I10 Essential (primary) hypertension: Secondary | ICD-10-CM

## 2022-09-13 DIAGNOSIS — R5381 Other malaise: Secondary | ICD-10-CM | POA: Diagnosis not present

## 2022-09-13 DIAGNOSIS — Z7901 Long term (current) use of anticoagulants: Secondary | ICD-10-CM | POA: Diagnosis not present

## 2022-09-13 DIAGNOSIS — N3941 Urge incontinence: Secondary | ICD-10-CM

## 2022-09-13 DIAGNOSIS — E785 Hyperlipidemia, unspecified: Secondary | ICD-10-CM

## 2022-09-13 DIAGNOSIS — Z09 Encounter for follow-up examination after completed treatment for conditions other than malignant neoplasm: Secondary | ICD-10-CM | POA: Diagnosis not present

## 2022-09-13 DIAGNOSIS — N390 Urinary tract infection, site not specified: Secondary | ICD-10-CM

## 2022-09-13 DIAGNOSIS — R399 Unspecified symptoms and signs involving the genitourinary system: Secondary | ICD-10-CM | POA: Diagnosis not present

## 2022-09-13 DIAGNOSIS — K219 Gastro-esophageal reflux disease without esophagitis: Secondary | ICD-10-CM

## 2022-09-13 DIAGNOSIS — R8290 Unspecified abnormal findings in urine: Secondary | ICD-10-CM

## 2022-09-13 DIAGNOSIS — Z9181 History of falling: Secondary | ICD-10-CM

## 2022-09-13 DIAGNOSIS — B9620 Unspecified Escherichia coli [E. coli] as the cause of diseases classified elsewhere: Secondary | ICD-10-CM

## 2022-09-13 DIAGNOSIS — E1165 Type 2 diabetes mellitus with hyperglycemia: Secondary | ICD-10-CM

## 2022-09-13 DIAGNOSIS — E119 Type 2 diabetes mellitus without complications: Secondary | ICD-10-CM

## 2022-09-13 LAB — POCT URINALYSIS DIP (CLINITEK)
Bilirubin, UA: NEGATIVE
Glucose, UA: NEGATIVE mg/dL
Ketones, POC UA: NEGATIVE mg/dL
Nitrite, UA: POSITIVE — AB
Spec Grav, UA: 1.01 (ref 1.010–1.025)
Urobilinogen, UA: 2 U/dL — AB
pH, UA: 6.5 (ref 5.0–8.0)

## 2022-09-13 NOTE — Progress Notes (Signed)
Patient Care Team: Margaree Mackintosh, MD as PCP - General (Internal Medicine) Rollene Rotunda, MD as PCP - Cardiology (Cardiology) Clarene Duke Karma Lew, RN as [facility] Care Management Chucky May, M.D., PA  Visit Date: 09/13/22  Subjective:    Patient ID: Ebony Ashley , Female   DOB: June 27, 1945, 77 y.o.    MRN: [id number]   77 y.o. Female presents today for a hospital/ED follow-up. She is accompanied by her son. Treated here on 07/13/22 for UTI with ciprofloxacin and then given Ceftraixone on 07/16/22. Since first developing symptoms, she was having gradually progressing weakness and was admitted to [facility] from 08/01/22 - 08/06/22 for generalized weakness. Lactic acid was normal, WBC 16,500. UA positive. CT head showed no acute findings. CT abdomen and pelvis showed findings concerning for pyelonephritis. Chest X-ray showed cardiomegaly and mild pulmonary vascular congestion. Initially, she was continued on IV ceftriaxone and later de-escalated to Bactrim for five days. Given Diflucan for intertrigo. Discharged to skilled nursing facility and she is now living at home with some assistance from her son, with whom she plans to move-in later this year. She is having frequent nocturia. She is doing well with PT and has regained some strength. She has stopped drinking orange juice. HGBA1c at 8.2% on 08/01/22. Denies shortness of breath, chest pain. 08/01/22 EKG showed atrial fibrillation. Followed by cardiologist, Dr. Antoine Poche.   Past Medical History:  Diagnosis Date   Atrial fibrillation (HCC)    Diabetes mellitus    Esophageal spasm    Esophagitis    Hiatal hernia    Hypertension      Family History  Problem Relation Age of Onset   Arthritis Mother    Atrial fibrillation Mother 48   Fibromyalgia Mother    Osteoporosis Mother    Colon cancer Mother    Hypertension Brother    Diabetes Brother    Asthma Brother          Review of Systems  Constitutional:   Negative for fever and malaise/fatigue.  HENT:  Negative for congestion.   Eyes:  Negative for blurred vision.  Respiratory:  Negative for cough and shortness of breath.   Cardiovascular:  Negative for chest pain, palpitations and leg swelling.  Gastrointestinal:  Negative for vomiting.  Genitourinary:        (+) Nocturia  Musculoskeletal:  Negative for back pain.  Skin:  Negative for rash.  Neurological:  Negative for loss of consciousness and headaches.        Objective:   Vitals: BP 120/80   Pulse 76   Temp 99 F (37.2 C)   Ht 5' 1.25" (1.556 m)   Wt 245 lb (111.1 kg)   LMP 01/05/1988 (Approximate)   SpO2 95%   BMI 45.92 kg/m    Physical Exam Vitals and nursing note reviewed.  Constitutional:      General: She is not in acute distress.    Appearance: Normal appearance. She is not toxic-appearing.  HENT:     Head: Normocephalic and atraumatic.  Cardiovascular:     Rate and Rhythm: Normal rate. Rhythm irregularly irregular. No extrasystoles are present.    Pulses: Normal pulses.     Heart sounds: Murmur heard.     Systolic murmur is present with a grade of 2/6.     No friction rub. No gallop.     Comments: Frequent irregular contractions. Pulmonary:     Effort: Pulmonary effort is normal. No respiratory distress.  Breath sounds: Normal breath sounds. No wheezing or rales.  Feet:     Comments: Skin is very dry in lower extremities. Skin:    General: Skin is warm and dry.  Neurological:     Mental Status: She is alert and oriented to person, place, and time. Mental status is at baseline.  Psychiatric:        Mood and Affect: Mood normal.        Behavior: Behavior normal.        Thought Content: Thought content normal.        Judgment: Judgment normal.       Results:   Studies obtained and personally reviewed by me:  CT head showed no acute findings.   CT abdomen and pelvis showed findings concerning for pyelonephritis.   Chest X-ray showed  cardiomegaly and mild pulmonary vascular congestion.  08/01/22 EKG showed atrial fibrillation.  Labs:       Component Value Date/Time   NA 136 08/06/2022 0332   K 3.6 08/06/2022 0332   CL 100 08/06/2022 0332   CO2 25 08/06/2022 0332   GLUCOSE 196 (H) 08/06/2022 0332   BUN 8 08/06/2022 0332   CREATININE 0.69 08/06/2022 0332   CREATININE 0.92 07/13/2022 0938   CALCIUM 9.0 08/06/2022 0332   PROT 5.9 (L) 08/02/2022 0510   ALBUMIN 3.0 (L) 08/02/2022 0510   AST 33 08/02/2022 0510   ALT 23 08/02/2022 0510   ALKPHOS 72 08/02/2022 0510   BILITOT 0.9 08/02/2022 0510   GFRNONAA >60 08/06/2022 0332   GFRNONAA 73 05/27/2020 0934   GFRAA 85 05/27/2020 0934     Lab Results  Component Value Date   WBC 9.8 08/02/2022   HGB 15.0 08/02/2022   HCT 46.2 (H) 08/02/2022   MCV 92.4 08/02/2022   PLT 247 08/02/2022    Lab Results  Component Value Date   CHOL 166 07/13/2022   HDL 44 (L) 07/13/2022   LDLCALC 100 (H) 07/13/2022   TRIG 122 07/13/2022   CHOLHDL 3.8 07/13/2022    Lab Results  Component Value Date   HGBA1C 8.2 (H) 08/01/2022     Lab Results  Component Value Date   TSH 1.035 08/01/2022      Assessment & Plan:   UTI; Hospital/ED follow-up: ordered CMET, CBC with Diff/Plat, A1c, TSH. Given contact for endocrinologist, Dr. Ocie Cornfield. Urine was cloudy and urinalysis showed moderate blood, urobilinogen 2.0, positive nitrite, moderate leukocytes. Ordered urine culture. Referral to urology, cardiology.    I,Alexander Ruley,acting as a Neurosurgeon for Margaree Mackintosh, MD.,have documented all relevant documentation on the behalf of Margaree Mackintosh, MD,as directed by  Margaree Mackintosh, MD while in the presence of Margaree Mackintosh, MD.   I, Margaree Mackintosh, MD, have reviewed all documentation for this visit. The documentation on 09/13/22 for the exam, diagnosis, procedures, and orders are all accurate and complete.

## 2022-09-14 DIAGNOSIS — I270 Primary pulmonary hypertension: Secondary | ICD-10-CM

## 2022-09-14 DIAGNOSIS — I4891 Unspecified atrial fibrillation: Secondary | ICD-10-CM

## 2022-09-14 NOTE — Patient Instructions (Signed)
Continue current meds. Labs drawn and pending. I believe patient does need assistance in the home. Son lives out of town but is trying to check in on her. Patient needs to see her Endocrinologist, Dr. Roanna Raider and was instructed to call for an appointment. Her urine specimen from home is abnormal and culture was sent. May not be a clean catch. She is asymptomatic.

## 2022-09-15 ENCOUNTER — Ambulatory Visit: Payer: Self-pay

## 2022-09-15 LAB — CBC WITH DIFFERENTIAL/PLATELET
Absolute Monocytes: 439 {cells}/uL (ref 200–950)
Basophils Absolute: 52 {cells}/uL (ref 0–200)
Basophils Relative: 0.6 %
Eosinophils Absolute: 52 {cells}/uL (ref 15–500)
Eosinophils Relative: 0.6 %
HCT: 47 % — ABNORMAL HIGH (ref 35.0–45.0)
Hemoglobin: 15.3 g/dL (ref 11.7–15.5)
Lymphs Abs: 1101 {cells}/uL (ref 850–3900)
MCH: 29.9 pg (ref 27.0–33.0)
MCHC: 32.6 g/dL (ref 32.0–36.0)
MCV: 91.8 fL (ref 80.0–100.0)
MPV: 12.5 fL (ref 7.5–12.5)
Monocytes Relative: 5.1 %
Neutro Abs: 6957 {cells}/uL (ref 1500–7800)
Neutrophils Relative %: 80.9 %
Platelets: 344 10*3/uL (ref 140–400)
RBC: 5.12 10*6/uL — ABNORMAL HIGH (ref 3.80–5.10)
RDW: 13.8 % (ref 11.0–15.0)
Total Lymphocyte: 12.8 %
WBC: 8.6 10*3/uL (ref 3.8–10.8)

## 2022-09-15 LAB — COMPLETE METABOLIC PANEL WITH GFR
AG Ratio: 1.6 (calc) (ref 1.0–2.5)
ALT: 11 U/L (ref 6–29)
AST: 14 U/L (ref 10–35)
Albumin: 3.9 g/dL (ref 3.6–5.1)
Alkaline phosphatase (APISO): 116 U/L (ref 37–153)
BUN: 9 mg/dL (ref 7–25)
CO2: 28 mmol/L (ref 20–32)
Calcium: 9.7 mg/dL (ref 8.6–10.4)
Chloride: 102 mmol/L (ref 98–110)
Creat: 0.81 mg/dL (ref 0.60–1.00)
Globulin: 2.4 g/dL (ref 1.9–3.7)
Glucose, Bld: 187 mg/dL — ABNORMAL HIGH (ref 65–99)
Potassium: 4.5 mmol/L (ref 3.5–5.3)
Sodium: 141 mmol/L (ref 135–146)
Total Bilirubin: 0.5 mg/dL (ref 0.2–1.2)
Total Protein: 6.3 g/dL (ref 6.1–8.1)
eGFR: 75 mL/min/{1.73_m2} (ref >=60)

## 2022-09-15 LAB — URINE CULTURE
MICRO NUMBER:: 15439529
SPECIMEN QUALITY:: ADEQUATE

## 2022-09-15 LAB — HEMOGLOBIN A1C
Hgb A1c MFr Bld: 7.8 %{Hb} — ABNORMAL HIGH (ref <5.7)
Mean Plasma Glucose: 177 mg/dL
eAG (mmol/L): 9.8 mmol/L

## 2022-09-15 LAB — TSH: TSH: 1.14 m[IU]/L (ref 0.40–4.50)

## 2022-09-15 NOTE — Patient Outreach (Signed)
  Care Coordination   Follow Up Visit Note   09/15/2022 Name: Ebony Ashley MRN: [id number] DOB: 05-20-1945  Ebony Ashley is a 77 y.o. year old female who sees Baxley, Luanna Cole, MD for primary care. I spoke with  Ebony Ashley by phone today.  What matters to the patients health and wellness today?  Patient will schedule and keep all MD follow up appointments as directed.     Goals Addressed             This Visit's Progress    Schedule and keep all MD appointments       Care Coordination Interventions: Patient interviewed about adult health maintenance status including  scheduling and keeping all MD follow up appointments as directed  Reviewed and discussed with patient the need to schedule a follow up with [facility], Dr. Arita Miss, Dr. Roanna Raider, Endocrinologist, and Dr. Antoine Poche, Cardiologist  Discussed with patient she will ask her son to help schedule these appointments so he can coordinate with his availability to help with transportation Discussed plans with patient for ongoing care coordination follow up and provided patient with direct contact information for nurse care coordinator     To avoid having further falls   On track    Care Coordination Interventions: Advised patient of importance of notifying provider of falls Assessed for falls since last encounter Assessed patients knowledge of fall risk prevention secondary to previously provided education     Interventions Today    Flowsheet Row Most Recent Value  Chronic Disease   Chronic disease during today's visit Other, Diabetes  [UTI]  General Interventions   General Interventions Discussed/Reviewed General Interventions Discussed, General Interventions Reviewed, Labs, Doctor Visits  Doctor Visits Discussed/Reviewed Doctor Visits Discussed, Doctor Visits Reviewed, Specialist, PCP  Education Interventions   Education Provided Provided Education  Provided Verbal Education On Labs, When to see the doctor           SDOH assessments and interventions completed:  No     Care Coordination Interventions:  Yes, provided   Follow up plan: Follow up call scheduled for 09/30/22 @2 :30 PM    Encounter Outcome:  Patient Visit Completed

## 2022-09-15 NOTE — Patient Instructions (Signed)
Visit Information  Thank you for taking time to visit with me today. Please don't hesitate to contact me if I can be of assistance to you.   Following are the goals we discussed today:   Goals Addressed             This Visit's Progress    Schedule and keep all MD appointments       Care Coordination Interventions: Patient interviewed about adult health maintenance status including  scheduling and keeping all MD follow up appointments as directed  Reviewed and discussed with patient the need to schedule a follow up with [facility], Dr. Arita Miss, Dr. Roanna Raider, Endocrinologist, and Dr. Antoine Poche, Cardiologist  Discussed with patient she will ask her son to help schedule these appointments so he can coordinate with his availability to help with transportation Discussed plans with patient for ongoing care coordination follow up and provided patient with direct contact information for nurse care coordinator       To avoid having further falls   On track    Care Coordination Interventions: Advised patient of importance of notifying provider of falls Assessed for falls since last encounter Assessed patients knowledge of fall risk prevention secondary to previously provided education         Our next appointment is by telephone on 09/30/22 at 2:30 PM  Please call the care guide team at [phone] if you need to cancel or reschedule your appointment.   If you are experiencing a Mental Health or Behavioral Health Crisis or need someone to talk to, please call 1-800-273-TALK (toll free, 24 hour hotline)  The patient verbalized understanding of instructions, educational materials, and care plan provided today and DECLINED offer to receive copy of patient instructions, educational materials, and care plan.   Delsa Sale RN BSN CCM [place]  [facility], [facility] Health Nurse Care Coordinator  Direct Dial: [phone] Website: [email or web address]

## 2022-09-17 ENCOUNTER — Other Ambulatory Visit: Payer: Self-pay

## 2022-09-17 MED ORDER — CIPROFLOXACIN HCL 250 MG PO TABS: 250.0000 mg | ORAL_TABLET | Freq: Two times a day (BID) | ORAL | 0 refills | Status: AC

## 2022-09-20 ENCOUNTER — Telehealth: Payer: Self-pay | Admitting: Internal Medicine

## 2022-09-20 NOTE — Telephone Encounter (Signed)
Received Orders from [facility] Fax [phone], phone [phone].  Certification period  09/07/2022 to 11/05/2022  Order # [id number]  Faxed Back 10 pages 09/13/2022 at 4:14 PM

## 2022-09-20 NOTE — Telephone Encounter (Signed)
Received Orders from [facility] Fax [phone], phone [phone].  [phone]  DME equipment for Ebony Ashley    Faxed to [phone] on 09/13/2022 at 4:05 PM  This message was sent via [facility], a product from [employer]. [email or web address].biscom.com/                    -------Fax Transmission Report-------  To:               Recipient at [phone] Subject:          Fw: Hp Scans Result:           The transmission was successful. Explanation:      All Pages Ok Pages Sent:       5 Connect Time:     2 minutes, 20 seconds Transmit Time:    09/20/2022 16:44 Transfer Rate:    14400 Status Code:      0000 Retry Count:      0 Job Id:           [id number] Unique Id:        [id number] Fax Line:         7 Fax Server:       [employer]

## 2022-09-20 NOTE — Telephone Encounter (Signed)
Received Orders from [facility] Fax [phone], phone [phone].   Order # [id number]  OT Orders faxed  This message was sent via [employer], a product from [employer] [email or web address].[email or web address]/                    -------Fax Transmission Report-------  To:               Recipient at [phone] Subject:          Fw: Hp Scans Result:           The transmission was successful. Explanation:      All Pages Ok Pages Sent:       4 Connect Time:     2 minutes, 27 seconds Transmit Time:    09/20/2022 15:59 Transfer Rate:    14400 Status Code:      0000 Retry Count:      0 Job Id:           [id number] Unique Id:        [id number] Fax Line:         25 Fax Server:       [employer]

## 2022-09-24 ENCOUNTER — Other Ambulatory Visit: Payer: Self-pay | Admitting: Internal Medicine

## 2022-09-28 ENCOUNTER — Ambulatory Visit (INDEPENDENT_AMBULATORY_CARE_PROVIDER_SITE_OTHER): Payer: PPO

## 2022-09-28 VITALS — BP 110/80 | HR 90 | Temp 98.2°F | Ht 61.25 in | Wt 245.0 lb

## 2022-09-28 DIAGNOSIS — A498 Other bacterial infections of unspecified site: Secondary | ICD-10-CM

## 2022-09-28 DIAGNOSIS — N390 Urinary tract infection, site not specified: Secondary | ICD-10-CM

## 2022-09-28 DIAGNOSIS — E7439 Other disorders of intestinal carbohydrate absorption: Secondary | ICD-10-CM | POA: Diagnosis not present

## 2022-09-28 LAB — POCT URINALYSIS DIP (CLINITEK)
Bilirubin, UA: NEGATIVE
Blood, UA: NEGATIVE
Glucose, UA: NEGATIVE mg/dL
Ketones, POC UA: NEGATIVE mg/dL
Leukocytes, UA: NEGATIVE
Nitrite, UA: NEGATIVE
POC PROTEIN,UA: NEGATIVE
Spec Grav, UA: 1.01 (ref 1.010–1.025)
Urobilinogen, UA: 0.2 E.U./dL
pH, UA: 6.5 (ref 5.0–8.0)

## 2022-09-28 LAB — POCT GLUCOSE (DEVICE FOR HOME USE): Glucose Fasting, POC: 180 mg/dL — AB (ref 70–99)

## 2022-09-28 NOTE — Progress Notes (Signed)
Patient complains of frequent urination to CMA today. Accucheck stable. Urine dipstick shows no UTI. Has been treated recently for persistent UTI. Continues to live alone. Has home health coming to see her Nurse visit only today. MJB, MD

## 2022-09-28 NOTE — Patient Instructions (Signed)
Urinary tract infection status posttreatment has resolved.  Patient complaining of urinary frequency.  Accu-Chek obtained today and level is in the low 200s.  Continue to work on diet exercise and glucose control.

## 2022-09-30 ENCOUNTER — Ambulatory Visit: Payer: Self-pay

## 2022-09-30 NOTE — Patient Outreach (Signed)
Care Coordination   Follow Up Visit Note   09/30/2022 Name: Ebony Ashley MRN: [id number] DOB: 10/14/1945  Ebony Ashley is a 77 y.o. year old female who sees Baxley, Luanna Cole, MD for primary care. I spoke with  [name] by phone today.  What matters to the patients health and wellness today?  Patient would like to have her urinary frequency further evaluated.     Goals Addressed             This Visit's Progress    Schedule and keep all MD appointments   Not on track    Care Coordination Interventions: Patient interviewed about adult health maintenance status including  scheduling and keeping all MD follow up appointments as directed  Reviewed and discussed with patient the need to schedule a follow up with [facility], Dr. Arita Miss, Dr. Roanna Raider, Endocrinologist, and Dr. Antoine Poche, Cardiologist  Discussed with patient she will ask her son to help schedule these appointments so he can coordinate with his availability to help with transportation Discussed plans with patient for ongoing care coordination follow up and provided patient with direct contact information for nurse care coordinator      To avoid having further falls   On track    Care Coordination Interventions: Advised patient of importance of notifying provider of falls Assessed for falls since last encounter Assessed patients knowledge of fall risk prevention secondary to previously provided education      To have urinary frequency evaluated by Urologist       Care Coordination Interventions: Evaluation of current treatment plan related to urinary frequency and patient's adherence to plan as established by provider Determined patient f/u with her PCP for a urine check, she was advised her urinalysis was negative for UTI Reviewed and discussed with patient she has 1-2 doses of Cipro remaining, she plans to complete the full course Determined patient continues to have urinary frequency, she denies having  other symptoms Reiterated to patient the importance of staying well hydrated with water, aiming for 48-64 oz daily unless directed Educated patient about the potential for urinary frequency due to hyperglycemia, patient is not checking her sugars at home Reviewed and discussed with patient she is scheduled to see a Urologist at [facility] on 10/11/22, she is unsure if she will drive herself or ask her son to help with transportation Reiterated to patient that she can contacted her health plan to request a ride to her appointment and this should be done so in advance and patient verbalizes understanding  Discussed plans with patient for ongoing care coordination follow up and provided patient with direct contact information for nurse care coordinator    Interventions Today    Flowsheet Row Most Recent Value  Chronic Disease   Chronic disease during today's visit Diabetes, Other  [urinary frequecy]  General Interventions   General Interventions Discussed/Reviewed General Interventions Discussed, General Interventions Reviewed, Doctor Visits  Doctor Visits Discussed/Reviewed Doctor Visits Discussed, Doctor Visits Reviewed, Specialist, PCP  Education Interventions   Education Provided Provided Education  Provided Verbal Education On When to see the doctor, Labs, Blood Sugar Monitoring, Nutrition  Nutrition Interventions   Nutrition Discussed/Reviewed Nutrition Reviewed, Nutrition Discussed, Fluid intake  Pharmacy Interventions   Pharmacy Dicussed/Reviewed Pharmacy Topics Discussed, Pharmacy Topics Reviewed, Medications and their functions          SDOH assessments and interventions completed:  No     Care Coordination Interventions:  Yes, provided   Follow up plan:  Follow up call scheduled for 10/13/22 @2 :30 PM    Encounter Outcome:  Patient Visit Completed

## 2022-09-30 NOTE — Patient Instructions (Addendum)
Visit Information  Thank you for taking time to visit with me today. Please don't hesitate to contact me if I can be of assistance to you.   Following are the goals we discussed today:   Goals Addressed             This Visit's Progress    Schedule and keep all MD appointments   Not on track    Care Coordination Interventions: Patient interviewed about adult health maintenance status including  scheduling and keeping all MD follow up appointments as directed  Reviewed and discussed with patient the need to schedule a follow up with [facility], Dr. Arita Miss, Dr. Roanna Raider, Endocrinologist, and Dr. Antoine Poche, Cardiologist  Discussed with patient she will ask her son to help schedule these appointments so he can coordinate with his availability to help with transportation Discussed plans with patient for ongoing care coordination follow up and provided patient with direct contact information for nurse care coordinator     To avoid having further falls   On track    Care Coordination Interventions: Advised patient of importance of notifying provider of falls Assessed for falls since last encounter Assessed patients knowledge of fall risk prevention secondary to previously provided education     To have urinary frequency evaluated by Urologist       Care Coordination Interventions: Evaluation of current treatment plan related to urinary frequency and patient's adherence to plan as established by provider Determined patient f/u with her PCP for a urine check, she was advised her urinalysis was negative for UTI Reviewed and discussed with patient she has 1-2 doses of Cipro remaining, she plans to complete the full course Determined patient continues to have urinary frequency, she denies having other symptoms Reiterated to patient the importance of staying well hydrated with water, aiming for 48-64 oz daily unless directed Educated patient about the potential for urinary frequency due to  hyperglycemia, patient is not checking her sugars at home Reviewed and discussed with patient she is scheduled to see a Urologist at [facility] on 10/11/22, she is unsure if she will drive herself or ask her son to help with transportation Reiterated to patient that she can contacted her health plan to request a ride to her appointment and this should be done so in advance and patient verbalizes understanding  Discussed plans with patient for ongoing care coordination follow up and provided patient with direct contact information for nurse care coordinator         Our next appointment is by telephone on 10/13/22 at 2:30 PM  Please call the care guide team at [phone] if you need to cancel or reschedule your appointment.   If you are experiencing a Mental Health or Behavioral Health Crisis or need someone to talk to, please call 1-800-273-TALK (toll free, 24 hour hotline)  The patient verbalized understanding of instructions, educational materials, and care plan provided today and DECLINED offer to receive copy of patient instructions, educational materials, and care plan.   Delsa Sale RN BSN CCM [place]  [facility], [facility] Health Nurse Care Coordinator  Direct Dial: [phone] Website: [name]

## 2022-10-08 ENCOUNTER — Telehealth: Payer: Self-pay | Admitting: Internal Medicine

## 2022-10-08 NOTE — Telephone Encounter (Signed)
-------  Fax Transmission Report-------  To:               Recipient at [phone] Subject:          Fw: Hp Scans Result:           The transmission was successful. Explanation:      All Pages Ok Pages Sent:       3 Connect Time:     1 minutes, 26 seconds Transmit Time:    10/08/2022 10:03 Transfer Rate:    14400 Status Code:      0000 Retry Count:      0 Job Id:           [id number] Unique Id:        [id number] Fax Line:         11 Fax Server:       [employer]

## 2022-10-08 NOTE — Telephone Encounter (Signed)
[facility] faxed orders to be signed and faxed back to [phone] Phone [phone]   Order # [id number]  ST effective 10/10/2022 1WK1

## 2022-10-13 ENCOUNTER — Ambulatory Visit: Payer: Self-pay

## 2022-10-13 NOTE — Patient Outreach (Signed)
  Care Coordination   10/13/2022 Name: Ebony Ashley MRN: [id number] DOB: [birth year]   Care Coordination Outreach Attempts:  An unsuccessful telephone outreach was attempted for a scheduled appointment today.  Follow Up Plan:  Additional outreach attempts will be made to offer the patient care coordination information and services.   Encounter Outcome:  No Answer   Care Coordination Interventions:  No, not indicated    Delsa Sale RN BSN CCM [place]  [facility], [facility] Health Nurse Care Coordinator  Direct Dial: [phone] Website: [email or web address]

## 2022-10-14 ENCOUNTER — Telehealth: Payer: Self-pay | Admitting: Internal Medicine

## 2022-10-14 NOTE — Telephone Encounter (Signed)
Gina Speech Therapist [facility]  Almira Coaster called to extend Speech Therapy eval by 1 week the visit on 10/10/2022 to 10/17/2022 due to prior appointment by patient.  Request approved.

## 2022-10-14 NOTE — Telephone Encounter (Signed)
-------  Fax Transmission Report-------  To:               Recipient at [phone] Subject:          Fw: Hp Scans Result:           The transmission was successful. Explanation:      All Pages Ok Pages Sent:       3 Connect Time:     1 minutes, 25 seconds Transmit Time:    10/14/2022 15:40 Transfer Rate:    14400 Status Code:      0000 Retry Count:      0 Job Id:           [id number] Unique Id:        [id number] Fax Line:         42 Fax Server:       [id number]

## 2022-10-14 NOTE — Telephone Encounter (Signed)
Received orders for patient to get a commode- Faxed back to [phone] and [phone]

## 2022-10-14 NOTE — Telephone Encounter (Signed)
-------  Fax Transmission Report-------  To:               Recipient at [phone] Subject:          Fw: Hp Scans Result:           The transmission was successful. Explanation:      All Pages Ok Pages Sent:       5 Connect Time:     2 minutes, 6 seconds Transmit Time:    10/14/2022 15:44 Transfer Rate:    14400 Status Code:      0000 Retry Count:      0 Job Id:           [id number] Unique Id:        [id number] Fax Line:         2 Fax Server:       [employer]

## 2022-10-14 NOTE — Telephone Encounter (Signed)
-------  Fax Transmission Report-------  To:               Recipient at [phone] Subject:          Fw: Hp Scans Result:           The transmission was successful. Explanation:      All Pages Ok Pages Sent:       5 Connect Time:     2 minutes, 7 seconds Transmit Time:    10/14/2022 12:07 Transfer Rate:    14400 Status Code:      0000 Retry Count:      0 Job Id:           [id number] Unique Id:        [id number] Fax Line:         37 Fax Server:       [employer]

## 2022-10-22 ENCOUNTER — Telehealth: Payer: Self-pay | Admitting: *Deleted

## 2022-10-22 ENCOUNTER — Telehealth: Payer: Self-pay | Admitting: Internal Medicine

## 2022-10-22 NOTE — Telephone Encounter (Signed)
Ebony Ashley - Speech Therapist  [facility] [phone]  Ebony Ashley called to get orders for ST 1W3 Dysphagia, Cognitive deficits. I ask her to go ahead and have some fax those over so you can sign them.

## 2022-10-22 NOTE — Progress Notes (Unsigned)
  Care Coordination Note  10/22/2022 Name: CHERYN SALMONSON MRN: [id number] DOB: 12-Aug-1945  KARENZA ELBON is a 77 y.o. year old female who is a primary care patient of Baxley, Luanna Cole, MD and is actively engaged with the care management team. I reached out to [name] by phone today to assist with re-scheduling a follow up visit with the RN Case Manager  Follow up plan: Unsuccessful telephone outreach attempt made. A HIPAA compliant phone message was left for the patient providing contact information and requesting a return call.   [facility]  Care Coordination Care Guide  Direct Dial: [phone]

## 2022-10-22 NOTE — Telephone Encounter (Signed)
Faxed signed form back to [facility].      -------Fax Transmission Report-------  To:               Recipient at [phone] Subject:          Fw: Hp Scans Result:           The transmission was successful. Explanation:      All Pages Ok Pages Sent:       4 Connect Time:     2 minutes, 25 seconds Transmit Time:    10/22/2022 15:15 Transfer Rate:    14400 Status Code:      0000 Retry Count:      0 Job Id:           [id number] Unique Id:        [id number] Fax Line:         57 Fax Server:       [id number]

## 2022-10-27 NOTE — Progress Notes (Signed)
  Care Coordination Note  10/27/2022 Name: Ebony Ashley MRN: [id number] DOB: 1945/02/23  Ebony Ashley is a 77 y.o. year old female who is a primary care patient of Baxley, Luanna Cole, MD and is actively engaged with the care management team. I reached out to [name] by phone today to assist with re-scheduling a follow up visit with the RN Case Manager  Follow up plan: Telephone appointment with care management team member scheduled for:11/09/22  [facility] Coordination Care Guide  Direct Dial: [phone]

## 2022-11-02 ENCOUNTER — Telehealth: Payer: Self-pay | Admitting: Cardiology

## 2022-11-02 NOTE — Telephone Encounter (Signed)
Pt c/o medication issue:  1. Name of Medication: Metoprolol Succinate 50 mg  2. How are you currently taking this medication (dosage and times per day)?   3. Are you having a reaction (difficulty breathing--STAT)?   4. What is your medication issue? She needs to know if patient is still taking this medicine

## 2022-11-02 NOTE — Telephone Encounter (Signed)
Verified current med list with home health nurse and prescribing physicians. Refill for metoprolol not needed at this time,

## 2022-11-04 ENCOUNTER — Telehealth: Payer: Self-pay | Admitting: Cardiology

## 2022-11-04 DIAGNOSIS — I4891 Unspecified atrial fibrillation: Secondary | ICD-10-CM

## 2022-11-04 NOTE — Telephone Encounter (Signed)
Patient calling the office for samples of medication:   1.  What medication and dosage are you requesting samples for? apixaban (ELIQUIS) 5 MG TABS tablet  2.  Are you currently out of this medication? Yes    

## 2022-11-04 NOTE — Telephone Encounter (Signed)
Spoke with pt regarding samples. Providing pt is 2 weeks of samples and pt assistance application. Pt states that she was going to be charged $400 to fill her prescription. She will pick up tomorrow and get application completed.

## 2022-11-05 MED ORDER — APIXABAN 5 MG PO TABS: 5.0000 mg | ORAL_TABLET | Freq: Two times a day (BID) | ORAL | Status: DC

## 2022-11-08 ENCOUNTER — Ambulatory Visit: Payer: PPO | Admitting: Internal Medicine

## 2022-11-08 ENCOUNTER — Encounter: Payer: Self-pay | Admitting: Internal Medicine

## 2022-11-08 ENCOUNTER — Telehealth: Payer: Self-pay | Admitting: Internal Medicine

## 2022-11-08 VITALS — BP 110/80 | HR 92 | Temp 98.0°F | Ht 61.25 in | Wt 242.0 lb

## 2022-11-08 DIAGNOSIS — Z6841 Body Mass Index (BMI) 40.0 and over, adult: Secondary | ICD-10-CM

## 2022-11-08 DIAGNOSIS — N390 Urinary tract infection, site not specified: Secondary | ICD-10-CM

## 2022-11-08 DIAGNOSIS — Z7901 Long term (current) use of anticoagulants: Secondary | ICD-10-CM

## 2022-11-08 DIAGNOSIS — I4891 Unspecified atrial fibrillation: Secondary | ICD-10-CM

## 2022-11-08 DIAGNOSIS — E7439 Other disorders of intestinal carbohydrate absorption: Secondary | ICD-10-CM

## 2022-11-08 DIAGNOSIS — R350 Frequency of micturition: Secondary | ICD-10-CM

## 2022-11-08 DIAGNOSIS — N3281 Overactive bladder: Secondary | ICD-10-CM | POA: Diagnosis not present

## 2022-11-08 DIAGNOSIS — E1165 Type 2 diabetes mellitus with hyperglycemia: Secondary | ICD-10-CM

## 2022-11-08 DIAGNOSIS — E782 Mixed hyperlipidemia: Secondary | ICD-10-CM

## 2022-11-08 LAB — POCT URINALYSIS DIP (CLINITEK)
Bilirubin, UA: NEGATIVE
Blood, UA: NEGATIVE
Glucose, UA: NEGATIVE mg/dL
Ketones, POC UA: NEGATIVE mg/dL
Nitrite, UA: POSITIVE — AB
POC PROTEIN,UA: NEGATIVE
Spec Grav, UA: 1.015 (ref 1.010–1.025)
Urobilinogen, UA: 0.2 U/dL
pH, UA: 6 (ref 5.0–8.0)

## 2022-11-08 MED ORDER — CIPROFLOXACIN HCL 250 MG PO TABS: 250.0000 mg | ORAL_TABLET | Freq: Two times a day (BID) | ORAL | 0 refills | Status: DC

## 2022-11-08 NOTE — Telephone Encounter (Signed)
Ebony Ashley [phone]  Ebony Ashley left a voice mail message saying she still has a UTI, and she can not come in today or tomorrow.  I also got a message that she NO Showed her appointment with Dr. Arita Miss at [facility] urology on 10/26/2022.

## 2022-11-09 ENCOUNTER — Telehealth: Payer: Self-pay | Admitting: Internal Medicine

## 2022-11-09 DIAGNOSIS — N390 Urinary tract infection, site not specified: Secondary | ICD-10-CM | POA: Diagnosis not present

## 2022-11-09 DIAGNOSIS — M6281 Muscle weakness (generalized): Secondary | ICD-10-CM

## 2022-11-09 DIAGNOSIS — E119 Type 2 diabetes mellitus without complications: Secondary | ICD-10-CM | POA: Diagnosis not present

## 2022-11-09 DIAGNOSIS — E860 Dehydration: Secondary | ICD-10-CM | POA: Diagnosis not present

## 2022-11-09 NOTE — Progress Notes (Signed)
   Subjective:    Patient ID: Ebony Ashley, female    DOB: 12/20/1945, 77 y.o.   MRN: [id number]  HPI Patient seen today urgently after receiving phone call from [facility] Nurse and patient's son that patient was having UTI symptoms once again.   She apparently was not able to keep appt. with Urologist, Dr. Arita Miss at [facility] but says she has made another appt for the near future there. She currently has home health services but is residing alone. Son lives in the [place] area and visits her often. Patient is driving and drove herself to the appointment today.  Hx of recurrent E.coli UTI July 2024.  She was treated here July 13, 2022 for a urinary tract infection with Cipro and then given ceftriaxone on July 12.  Subsequently had progressive weakness and was admitted to [facility] long hospital from July 28 to August 2 for generalized weakness.  White count was 16,500.  Lactic acid was normal.  Urine specimen is abnormal.  CT of the head showed no acute findings.  CT of abdomen and pelvis showed findings concerning for pyelonephritis.  Initially she was treated with IV ceftriaxone and later changed to Bactrim for 5 days.  Was given Diflucan for intertrigo and was discharged to skilled nursing facility until early September.  She is now back at home but does not have around-the-clock home health care.  Prior to her admission to the hospital she was drinking orange juice and eating a lot of sweets such as doughnuts.  Dr. Antoine Poche is her cardiologist and she has a history of atrial fibrillation.    Review of Systems urinary frequency but no dysuria. Has noticed odor to urine.     Objective:   Physical Exam  Today she is seen in the office in no acute distress.  She is alert and oriented.  Does not look to have generalized weakness and drove herself to the appointment.  Blood pressure is 110/80, pulse 92 with irregular contractions.  Temperature 98 degrees.  Pulse oximetry on room air is  95%.  BMI 45.35 height 5 feet 1.25 inches  No CVA tenderness  Dipstick UA from clean-catch urine shows the urine to be cloudy, positive for nitrite and 3+ LE present.  No proteinuria or occult blood.  Specific gravity 1.015.  No ketones.        Assessment & Plan:  Recurrent urinary tract infection  Type 2 diabetes mellitus-hemoglobin A1c in early September was 7.8% and has been as high as 11.2% some 6 months ago.  She has been referred to Endocrinologist  Plan: Urine was sent for culture.  She was placed on Cipro 250 mg twice daily for 10 days with follow-up here in 2 weeks.  She should make an appointment for follow-up with Dr. Arita Miss.  We understand she does have an appointment.  Addendum: Urine culture grew Klebsiella variicola with excellent sensitivity to Cipro.  She UTI check appointment on November 19 here.

## 2022-11-09 NOTE — Telephone Encounter (Signed)
This message was sent via FAXCOM, a product from [employer]. http://www.biscom.com/                    -------Fax Transmission Report-------  To:               Recipient at [phone] Subject:          Fw: Hp Scans Result:           The transmission was successful. Explanation:      All Pages Ok Pages Sent:       8 Connect Time:     6 minutes, 19 seconds Transmit Time:    11/09/2022 10:34 Transfer Rate:    14400 Status Code:      0000 Retry Count:      0 Job Id:           [id number] Unique Id:        [id number] Fax Line:         27 Fax Server:       [id number]

## 2022-11-09 NOTE — Telephone Encounter (Signed)
Received Home Health re-Certification and Plan of Care form to complete and sign for [facility].  Signed and fax back to [phone] Phone [phone]  Certification period 11/06/2022 till 01/04/2023  Order # [id number]

## 2022-11-11 LAB — URINE CULTURE
MICRO NUMBER:: 15682022
SPECIMEN QUALITY:: ADEQUATE

## 2022-11-11 NOTE — Telephone Encounter (Signed)
Copied from CRM [id number]. Topic: Clinical - Lab/Test Results >> Nov 11, 2022 11:13 AM Raven B wrote: Reason for CRM: PT calling in regarding lab results. Call back # [phone]  Contacted patient and notified, she had not started the cipro I advised patient that she needs to start it today. I made her a follow up appt on 11/23/22. Patient verbalized understanding.

## 2022-11-16 NOTE — Progress Notes (Shared)
    Patient Care Team: Margaree Mackintosh, MD as PCP - General (Internal Medicine) Rollene Rotunda, MD as PCP - Cardiology (Cardiology) Clarene Duke Karma Lew, RN as [facility] Care Management Chucky May, M.D., PA  Visit Date: 11/16/22  Subjective:    Patient ID: Ebony Ashley , Female   DOB: [birth year], 77 y.o.    MRN: [id number]   77 y.o. Female presents today for UTI recheck. Treated here on 11/08/22 with ciprofloxacin.   Past Medical History:  Diagnosis Date   Atrial fibrillation (HCC)    Diabetes mellitus    Esophageal spasm    Esophagitis    Hiatal hernia    Hypertension      Family History  Problem Relation Age of Onset   Arthritis Mother    Atrial fibrillation Mother 42   Fibromyalgia Mother    Osteoporosis Mother    Colon cancer Mother    Hypertension Brother    Diabetes Brother    Asthma Brother     Social History   Social History Narrative   Not on file      ROS      Objective:   Vitals: LMP 01/05/1988 (Approximate)    Physical Exam    Results:   Studies obtained and personally reviewed by me:  Imaging, colonoscopy, mammogram, bone density scan, echocardiogram, heart cath, stress test, CT calcium score, etc. ***   Labs:       Component Value Date/Time   NA 141 09/13/2022 1247   K 4.5 09/13/2022 1247   CL 102 09/13/2022 1247   CO2 28 09/13/2022 1247   GLUCOSE 187 (H) 09/13/2022 1247   BUN 9 09/13/2022 1247   CREATININE 0.81 09/13/2022 1247   CALCIUM 9.7 09/13/2022 1247   PROT 6.3 09/13/2022 1247   ALBUMIN 3.0 (L) 08/02/2022 0510   AST 14 09/13/2022 1247   ALT 11 09/13/2022 1247   ALKPHOS 72 08/02/2022 0510   BILITOT 0.5 09/13/2022 1247   GFRNONAA >60 08/06/2022 0332   GFRNONAA 73 05/27/2020 0934   GFRAA 85 05/27/2020 0934     Lab Results  Component Value Date   WBC 8.6 09/13/2022   HGB 15.3 09/13/2022   HCT 47.0 (H) 09/13/2022   MCV 91.8 09/13/2022   PLT 344 09/13/2022    Lab Results  Component Value  Date   CHOL 166 07/13/2022   HDL 44 (L) 07/13/2022   LDLCALC 100 (H) 07/13/2022   TRIG 122 07/13/2022   CHOLHDL 3.8 07/13/2022    Lab Results  Component Value Date   HGBA1C 7.8 (H) 09/13/2022     Lab Results  Component Value Date   TSH 1.14 09/13/2022     No results found for: "PSA1", "PSA" *** delete for female pts  ***    Assessment & Plan:   ***    I,Alexander Ruley,acting as a scribe for Margaree Mackintosh, MD.,have documented all relevant documentation on the behalf of Margaree Mackintosh, MD,as directed by  Margaree Mackintosh, MD while in the presence of Margaree Mackintosh, MD.   ***

## 2022-11-17 ENCOUNTER — Telehealth: Payer: Self-pay | Admitting: Cardiology

## 2022-11-17 NOTE — Telephone Encounter (Signed)
Paper Work Dropped Off: Patient Assistance  Date:11/17/2022  Location of paper:  Dr Antoine Poche mail box

## 2022-11-20 NOTE — Patient Instructions (Addendum)
Patient has been diagnosed with another urinary tract infection.  She was advised to take Cipro 250 mg twice daily for 10 days and follow-up here on November 19.  Encouraged to keep appointment with Dr. Arita Miss, urologist.  Culture grew Klebsiella varicola with excellent sensitivity to Cipro.  Continue endocrinology follow-up for diabetes mellitus

## 2022-11-23 ENCOUNTER — Encounter: Payer: PPO | Admitting: Internal Medicine

## 2022-11-24 ENCOUNTER — Telehealth: Payer: Self-pay | Admitting: Cardiology

## 2022-11-24 DIAGNOSIS — I4891 Unspecified atrial fibrillation: Secondary | ICD-10-CM

## 2022-11-24 NOTE — Telephone Encounter (Signed)
Pt following up on getting assistance with her Eliquis. Please advise

## 2022-11-25 ENCOUNTER — Telehealth: Payer: Self-pay | Admitting: Internal Medicine

## 2022-11-25 NOTE — Telephone Encounter (Signed)
Also received order # [id number] from [facility] have signed and faxed back to [phone], phone [phone]

## 2022-11-25 NOTE — Telephone Encounter (Signed)
Received Ordered # [id number] via fax and it has been signed and faxed back to [facility] [phone], phone [phone]

## 2022-11-25 NOTE — Telephone Encounter (Signed)
-------  Fax Transmission Report-------  To:               Recipient at [phone] Subject:          Fw: Hp Scans Result:           The transmission was successful. Explanation:      All Pages Ok Pages Sent:       3 Connect Time:     1 minutes, 26 seconds Transmit Time:    11/25/2022 10:03 Transfer Rate:    14400 Status Code:      0000 Retry Count:      0 Job Id:           [id number] Unique Id:        [id number] Fax Line:         30 Fax Server:       [employer]

## 2022-11-25 NOTE — Telephone Encounter (Signed)
-------  Fax Transmission Report-------  To:               Recipient at [phone] Subject:          Fw: Hp Scans Result:           The transmission was successful. Explanation:      All Pages Ok Pages Sent:       3 Connect Time:     1 minutes, 30 seconds Transmit Time:    11/25/2022 10:04 Transfer Rate:    14400 Status Code:      0000 Retry Count:      0 Job Id:           [id number] Unique Id:        [id number] Fax Line:         30 Fax Server:       [employer]

## 2022-11-26 ENCOUNTER — Telehealth: Payer: Self-pay | Admitting: Internal Medicine

## 2022-11-26 NOTE — Telephone Encounter (Signed)
Received verbal order # [id number] to be signed and faxed back to [facility] [phone], phone [phone]    -------Fax Transmission Report-------  To:               Recipient at [phone] Subject:          Fw: Hp Scans Result:           The transmission was successful. Explanation:      All Pages Ok Pages Sent:       3 Connect Time:     1 minutes, 24 seconds Transmit Time:    11/26/2022 10:26 Transfer Rate:    14400 Status Code:      0000 Retry Count:      0 Job Id:           [id number] Unique Id:        [id number] Fax Line:         33 Fax Server:       [id number]

## 2022-11-26 NOTE — Telephone Encounter (Signed)
Unable to reach pt or leave a message  

## 2022-11-30 MED ORDER — APIXABAN 5 MG PO TABS
5.0000 mg | ORAL_TABLET | Freq: Two times a day (BID) | ORAL | Status: AC
Start: 2022-11-30 — End: ?

## 2022-11-30 NOTE — Telephone Encounter (Signed)
Patient following up on eliquis authorization, please advise.

## 2022-11-30 NOTE — Telephone Encounter (Signed)
Spoke with pt son, aware I am not able to find the eliquis application. Samples placed at the front desk for patient pick up.

## 2022-11-30 NOTE — Telephone Encounter (Signed)
Pt's son is calling back bc he lost signal when he was speaking with someone earlier. Please advise

## 2022-12-06 ENCOUNTER — Telehealth: Payer: Self-pay | Admitting: Internal Medicine

## 2022-12-06 NOTE — Telephone Encounter (Signed)
-------  Fax Transmission Report-------  To:               Recipient at [phone] Subject:          Fw: Hp Scans Result:           The transmission was successful. Explanation:      All Pages Ok Pages Sent:       5 Connect Time:     3 minutes, 5 seconds Transmit Time:    12/06/2022 11:07 Transfer Rate:    14400 Status Code:      0000 Retry Count:      0 Job Id:           [id number] Unique Id:        [id number] Fax Line:         23 Fax Server:       [id number]

## 2022-12-06 NOTE — Telephone Encounter (Signed)
Received faxed order # [id number] from [facility], it has been signed and faxed back to them at [phone], phone [phone]  SN visits

## 2022-12-06 NOTE — Telephone Encounter (Signed)
Received faxed order # [id number] from [facility], it has been signed and faxed back to them at [phone], phone [phone]

## 2022-12-06 NOTE — Telephone Encounter (Signed)
-------  Fax Transmission Report-------  To:               Recipient at [phone] Subject:          Fw: Hp Scans Result:           The transmission was successful. Explanation:      All Pages Ok Pages Sent:       5 Connect Time:     3 minutes, 38 seconds Transmit Time:    12/06/2022 11:03 Transfer Rate:    14400 Status Code:      0000 Retry Count:      0 Job Id:           [id number] Unique Id:        [id number] Fax Line:         58 Fax Server:       [id number]

## 2022-12-07 ENCOUNTER — Ambulatory Visit: Payer: Self-pay

## 2022-12-07 NOTE — Patient Outreach (Signed)
  Care Coordination   12/07/2022 Name: Ebony Ashley MRN: [id number] DOB: 1945/02/02   Care Coordination Outreach Attempts:  An unsuccessful telephone outreach was attempted for a scheduled appointment today.  Follow Up Plan:  Additional outreach attempts will be made to offer the patient care coordination information and services.   Encounter Outcome:  No Answer   Care Coordination Interventions:  No, not indicated    Delsa Sale RN BSN CCM [place]  [facility], [facility] Health Nurse Care Coordinator  Direct Dial: [phone] Website: [email or web address]

## 2022-12-07 NOTE — Progress Notes (Shared)
    Patient Care Team: Margaree Mackintosh, MD as PCP - General (Internal Medicine) Rollene Rotunda, MD as PCP - Cardiology (Cardiology) Clarene Duke Karma Lew, RN as [facility] Care Management Chucky May, M.D., PA  Visit Date: 12/07/22  Subjective:    Patient ID: Ebony Ashley , Female   DOB: 05-Nov-1945, 77 y.o.    MRN: [id number]   77 y.o. Female presents today for . Treated here for UTI on 11/08/22 with ciprofloxacin.   Past Medical History:  Diagnosis Date   Atrial fibrillation (HCC)    Diabetes mellitus    Esophageal spasm    Esophagitis    Hiatal hernia    Hypertension      Family History  Problem Relation Age of Onset   Arthritis Mother    Atrial fibrillation Mother 75   Fibromyalgia Mother    Osteoporosis Mother    Colon cancer Mother    Hypertension Brother    Diabetes Brother    Asthma Brother     Social History   Social History Narrative   Not on file      ROS      Objective:   Vitals: LMP 01/05/1988 (Approximate)    Physical Exam    Results:   Studies obtained and personally reviewed by me:  Imaging, colonoscopy, mammogram, bone density scan, echocardiogram, heart cath, stress test, CT calcium score, etc. ***   Labs:       Component Value Date/Time   NA 141 09/13/2022 1247   K 4.5 09/13/2022 1247   CL 102 09/13/2022 1247   CO2 28 09/13/2022 1247   GLUCOSE 187 (H) 09/13/2022 1247   BUN 9 09/13/2022 1247   CREATININE 0.81 09/13/2022 1247   CALCIUM 9.7 09/13/2022 1247   PROT 6.3 09/13/2022 1247   ALBUMIN 3.0 (L) 08/02/2022 0510   AST 14 09/13/2022 1247   ALT 11 09/13/2022 1247   ALKPHOS 72 08/02/2022 0510   BILITOT 0.5 09/13/2022 1247   GFRNONAA >60 08/06/2022 0332   GFRNONAA 73 05/27/2020 0934   GFRAA 85 05/27/2020 0934     Lab Results  Component Value Date   WBC 8.6 09/13/2022   HGB 15.3 09/13/2022   HCT 47.0 (H) 09/13/2022   MCV 91.8 09/13/2022   PLT 344 09/13/2022    Lab Results  Component Value  Date   CHOL 166 07/13/2022   HDL 44 (L) 07/13/2022   LDLCALC 100 (H) 07/13/2022   TRIG 122 07/13/2022   CHOLHDL 3.8 07/13/2022    Lab Results  Component Value Date   HGBA1C 7.8 (H) 09/13/2022     Lab Results  Component Value Date   TSH 1.14 09/13/2022     No results found for: "PSA1", "PSA" *** delete for female pts  ***    Assessment & Plan:   ***    I,Alexander Ruley,acting as a scribe for Margaree Mackintosh, MD.,have documented all relevant documentation on the behalf of Margaree Mackintosh, MD,as directed by  Margaree Mackintosh, MD while in the presence of Margaree Mackintosh, MD.   ***

## 2022-12-09 ENCOUNTER — Telehealth: Payer: Self-pay | Admitting: Internal Medicine

## 2022-12-09 NOTE — Telephone Encounter (Signed)
-------  Fax Transmission Report-------  To:               Recipient at [phone] Subject:          Fw: Hp Scans Result:           The transmission was successful. Explanation:      All Pages Ok Pages Sent:       3 Connect Time:     1 minutes, 25 seconds Transmit Time:    12/08/2022 13:27 Transfer Rate:    14400 Status Code:      0000 Retry Count:      0 Job Id:           [id number] Unique Id:        [id number] Fax Line:         2 Fax Server:       [employer]

## 2022-12-09 NOTE — Telephone Encounter (Signed)
Received fax order from [facility] to sign and fax back [phone], phone [phone]  Order # [id number]  MSW Evaluation performed, No additional visits required

## 2022-12-13 ENCOUNTER — Telehealth: Payer: Self-pay | Admitting: Internal Medicine

## 2022-12-13 NOTE — Telephone Encounter (Signed)
-------  Fax Transmission Report-------  To:               Recipient at [phone] Subject:          Fw: Hp Scans Result:           The transmission was successful. Explanation:      All Pages Ok Pages Sent:       3 Connect Time:     1 minutes, 26 seconds Transmit Time:    12/13/2022 10:07 Transfer Rate:    14400 Status Code:      0000 Retry Count:      0 Job Id:           [id number] Unique Id:        [id number] Fax Line:         35 Fax Server:       [employer]

## 2022-12-13 NOTE — Telephone Encounter (Signed)
Received orders from [facility] that the family request SN discharged to be completed during week ending 12/24/2022  SN effective 12/19/2022 1WK1 OT effective 12/26/2022 1WK1  Order # [id number]

## 2022-12-14 ENCOUNTER — Ambulatory Visit: Payer: PPO | Admitting: Internal Medicine

## 2022-12-15 ENCOUNTER — Encounter: Payer: Self-pay | Admitting: Internal Medicine

## 2022-12-15 ENCOUNTER — Ambulatory Visit (INDEPENDENT_AMBULATORY_CARE_PROVIDER_SITE_OTHER): Payer: PPO | Admitting: Internal Medicine

## 2022-12-15 VITALS — BP 100/70 | HR 84 | Temp 98.5°F | Ht 61.25 in

## 2022-12-15 DIAGNOSIS — N3281 Overactive bladder: Secondary | ICD-10-CM

## 2022-12-15 DIAGNOSIS — R350 Frequency of micturition: Secondary | ICD-10-CM | POA: Diagnosis not present

## 2022-12-15 DIAGNOSIS — E1165 Type 2 diabetes mellitus with hyperglycemia: Secondary | ICD-10-CM | POA: Diagnosis not present

## 2022-12-15 DIAGNOSIS — N390 Urinary tract infection, site not specified: Secondary | ICD-10-CM | POA: Diagnosis not present

## 2022-12-15 DIAGNOSIS — R82998 Other abnormal findings in urine: Secondary | ICD-10-CM | POA: Diagnosis not present

## 2022-12-15 LAB — POCT URINALYSIS DIP (CLINITEK)
Bilirubin, UA: NEGATIVE
Blood, UA: NEGATIVE
Glucose, UA: NEGATIVE mg/dL
Ketones, POC UA: NEGATIVE mg/dL
Nitrite, UA: POSITIVE — AB
Spec Grav, UA: 1.01 (ref 1.010–1.025)
Urobilinogen, UA: 0.2 U/dL
pH, UA: 6 (ref 5.0–8.0)

## 2022-12-15 MED ORDER — CIPROFLOXACIN HCL 250 MG PO TABS: 250.0000 mg | ORAL_TABLET | Freq: Two times a day (BID) | ORAL | 0 refills | Status: AC

## 2022-12-15 NOTE — Patient Instructions (Addendum)
Take Cipro 250 mg twice daily for 10 days pending culture results. RTC in 2 weeks for follow up. See Dr. Alvester Morin January 8th.

## 2022-12-15 NOTE — Progress Notes (Signed)
   Subjective:    Patient ID: Ebony Ashley, female    DOB: 13-Mar-1945, 77 y.o.   MRN: [id number]  HPI 77 year old Female seen today with urinary frequency. In September had Citrobacter UTI and in November had Klebsiella variicola UTI. Was subsequently referred to [facility] for evaluation of recurrent UTIs.  She saw Dr. Sherron Monday at [facility] in May 2021.  She had urodynamic studies.  He was diagnosed with urge urinary incontinence.  She failed Myrbetriq and was placed on Vesicare.  Had also failed Ditropan.   Has missed 2 appts at [facility] and now has upcoming appt with Dr. Alvester Morin soon for evaluation of  recurrent UTIs. Resides alone. I think home health has signed off. Continues to drive. Daughter lives out of state and son lives in the [place] area. Admitted August 01, 2022 through August 2,2024 for pyelonephritis and mild pulmonary vascular congestion. Was discharged to skilled nursing facility and was discharged from there in late August.  Had E.coli UTI in July 2024.  We saw her early November for UTI treated with Cipro.  Has seen Dr. Antoine Poche for apical systolic murmur.  History of mild LVH and had 2D echocardiogram in 2013.  She has a history of type 2 diabetes mellitus, hypertension and hyperlipidemia.  History of atrial fibrillation.  She is on chronic anticoagulation with Eliquis per Dr. Antoine Poche.  Has been treated with tramadol sparingly for musculoskeletal pain.  Takes low-dose Crestor for hyperlipidemia.  History of urge urinary incontinence.  History of GE reflux treated with PPI.  Social history: She had a she is a widow and resides alone.  She continues to drive.  She does not smoke.  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  Adult daughter lives out of state who is a Engineer, civil (consulting) and 1 adult son who lives in the [place] area.   Husband is deceased.  She does have a life alert device to use since she resides alone.  Family history: Mother died with colon cancer at age 66.  Father died at age 80 in an automobile accident.  Stepbrother died at age 48 of a stroke.  No sisters.  Review of Systems no fever or shaking chills.  No nausea or vomiting.  No back pain.     Objective:   Physical Exam  Vital signs reviewed.  She is afebrile.  She obtain urine specimen by using a hat to collect her urine.  Cannot seem to get a good clean-catch specimen any other way.  No CVA tenderness.  She is in no acute distress.  Urine is cloudy.  No glucose or ketones.  Specific gravity 1.010.  Nitrite is positive.  LE is moderate.  Culture has been sent.    Assessment & Plan:   Patient is symptomatic with UTI.  Culture was sent.  She was started again on Cipro 250 mg twice daily for 10 days.  Patient urged to keep follow-up appointment at [facility] in the near future.  She has follow-up appointment here for UTI December 27.  She has 41-month recheck appointment here in January.

## 2022-12-18 LAB — URINE CULTURE
MICRO NUMBER:: 15837437
SPECIMEN QUALITY:: ADEQUATE

## 2022-12-20 NOTE — Progress Notes (Shared)
Patient Care Team: Margaree Mackintosh, MD as PCP - General (Internal Medicine) Rollene Rotunda, MD as PCP - Cardiology (Cardiology) Clarene Duke Karma Lew, RN as [facility] Care Management Chucky May, M.D., PA  Visit Date: 12/31/22  Subjective:    Patient ID: Ebony Ashley , Female   DOB: 1945/07/25, 77 y.o.    MRN: [id number]   77 y.o. Female presents today for UTI recheck. Seen here on 12/15/22 for UTI and treated with ciprofloxacin. Denies urinary symptoms.   Past Medical History:  Diagnosis Date   Atrial fibrillation (HCC)    Diabetes mellitus    Esophageal spasm    Esophagitis    Hiatal hernia    Hypertension      Family History  Problem Relation Age of Onset   Arthritis Mother    Atrial fibrillation Mother 61   Fibromyalgia Mother    Osteoporosis Mother    Colon cancer Mother    Hypertension Brother    Diabetes Brother    Asthma Brother     Social History   Social History Narrative   Not on file      Review of Systems  Constitutional:  Negative for fever and malaise/fatigue.  HENT:  Negative for congestion.   Eyes:  Negative for blurred vision.  Respiratory:  Negative for cough and shortness of breath.   Cardiovascular:  Negative for chest pain, palpitations and leg swelling.  Gastrointestinal:  Negative for vomiting.  Musculoskeletal:  Negative for back pain.  Skin:  Negative for rash.  Neurological:  Negative for loss of consciousness and headaches.        Objective:   Vitals: BP 130/80   Pulse 90   Temp 98.2 F (36.8 C)   Ht 5' 1.25" (1.556 m)   LMP 01/05/1988 (Approximate)   SpO2 96%   BMI 45.35 kg/m    Physical Exam Vitals and nursing note reviewed.  Constitutional:      General: She is not in acute distress.    Appearance: Normal appearance. She is not toxic-appearing.  HENT:     Head: Normocephalic and atraumatic.  Pulmonary:     Effort: Pulmonary effort is normal.  Abdominal:     Tenderness: There is no  right CVA tenderness or left CVA tenderness.  Skin:    General: Skin is warm and dry.  Neurological:     Mental Status: She is alert and oriented to person, place, and time. Mental status is at baseline.  Psychiatric:        Mood and Affect: Mood normal.        Behavior: Behavior normal.        Thought Content: Thought content normal.        Judgment: Judgment normal.       Results:   Studies obtained and personally reviewed by me:  Urinalysis normal today.  Labs:       Component Value Date/Time   NA 141 09/13/2022 1247   K 4.5 09/13/2022 1247   CL 102 09/13/2022 1247   CO2 28 09/13/2022 1247   GLUCOSE 187 (H) 09/13/2022 1247   BUN 9 09/13/2022 1247   CREATININE 0.81 09/13/2022 1247   CALCIUM 9.7 09/13/2022 1247   PROT 6.3 09/13/2022 1247   ALBUMIN 3.0 (L) 08/02/2022 0510   AST 14 09/13/2022 1247   ALT 11 09/13/2022 1247   ALKPHOS 72 08/02/2022 0510   BILITOT 0.5 09/13/2022 1247   GFRNONAA >60 08/06/2022 0332   GFRNONAA 73  05/27/2020 0934   GFRAA 85 05/27/2020 0934     Lab Results  Component Value Date   WBC 8.6 09/13/2022   HGB 15.3 09/13/2022   HCT 47.0 (H) 09/13/2022   MCV 91.8 09/13/2022   PLT 344 09/13/2022    Lab Results  Component Value Date   CHOL 166 07/13/2022   HDL 44 (L) 07/13/2022   LDLCALC 100 (H) 07/13/2022   TRIG 122 07/13/2022   CHOLHDL 3.8 07/13/2022    Lab Results  Component Value Date   HGBA1C 7.8 (H) 09/13/2022     Lab Results  Component Value Date   TSH 1.14 09/13/2022      Assessment & Plan:   UTI: she is no longer having symptoms. Urinalysis normal today. Return as needed.    I,Alexander Ruley,acting as a Neurosurgeon for Margaree Mackintosh, MD.,have documented all relevant documentation on the behalf of Margaree Mackintosh, MD,as directed by  Margaree Mackintosh, MD while in the presence of Margaree Mackintosh, MD.   ***

## 2022-12-24 ENCOUNTER — Telehealth: Payer: Self-pay | Admitting: Internal Medicine

## 2022-12-24 NOTE — Telephone Encounter (Signed)
-------  Fax Transmission Report-------  To:               Recipient at [phone] Subject:          Fw: Hp Scans Result:           The transmission was successful. Explanation:      All Pages Ok Pages Sent:       3 Connect Time:     1 minutes, 26 seconds Transmit Time:    12/24/2022 10:16 Transfer Rate:    14400 Status Code:      0000 Retry Count:      0 Job Id:           [id number] Unique Id:        [id number] Fax Line:         58 Fax Server:       [id number]

## 2022-12-24 NOTE — Telephone Encounter (Signed)
Received faxed orders from [facility] that patient rescheduled nursing discharge visit to 12/27/2022 SN effective 12/19/2022 1every2WK2  Order # [id number]

## 2022-12-26 ENCOUNTER — Other Ambulatory Visit: Payer: Self-pay | Admitting: Internal Medicine

## 2022-12-31 ENCOUNTER — Ambulatory Visit (INDEPENDENT_AMBULATORY_CARE_PROVIDER_SITE_OTHER): Payer: PPO | Admitting: Internal Medicine

## 2022-12-31 ENCOUNTER — Encounter: Payer: Self-pay | Admitting: Internal Medicine

## 2022-12-31 VITALS — BP 130/80 | HR 90 | Temp 98.2°F | Ht 61.25 in

## 2022-12-31 DIAGNOSIS — Z7901 Long term (current) use of anticoagulants: Secondary | ICD-10-CM

## 2022-12-31 DIAGNOSIS — N390 Urinary tract infection, site not specified: Secondary | ICD-10-CM | POA: Diagnosis not present

## 2022-12-31 DIAGNOSIS — R82998 Other abnormal findings in urine: Secondary | ICD-10-CM | POA: Diagnosis not present

## 2022-12-31 DIAGNOSIS — E1165 Type 2 diabetes mellitus with hyperglycemia: Secondary | ICD-10-CM

## 2022-12-31 DIAGNOSIS — R350 Frequency of micturition: Secondary | ICD-10-CM | POA: Diagnosis not present

## 2022-12-31 DIAGNOSIS — N3281 Overactive bladder: Secondary | ICD-10-CM | POA: Diagnosis not present

## 2022-12-31 DIAGNOSIS — N3941 Urge incontinence: Secondary | ICD-10-CM

## 2022-12-31 DIAGNOSIS — I4891 Unspecified atrial fibrillation: Secondary | ICD-10-CM

## 2022-12-31 LAB — POCT URINALYSIS DIP (CLINITEK)
Bilirubin, UA: NEGATIVE
Blood, UA: NEGATIVE
Glucose, UA: NEGATIVE mg/dL
Ketones, POC UA: NEGATIVE mg/dL
Leukocytes, UA: NEGATIVE
Nitrite, UA: NEGATIVE
POC PROTEIN,UA: NEGATIVE
Spec Grav, UA: 1.01 (ref 1.010–1.025)
Urobilinogen, UA: 0.2 U/dL
pH, UA: 6.5 (ref 5.0–8.0)

## 2023-01-09 ENCOUNTER — Other Ambulatory Visit: Payer: Self-pay | Admitting: Internal Medicine

## 2023-01-17 ENCOUNTER — Telehealth: Payer: Self-pay | Admitting: *Deleted

## 2023-01-17 NOTE — Progress Notes (Signed)
 Complex Care Management Care Guide Note  01/17/2023 Name: [name] MRN: [id number] DOB: July 20, 1945  Ebony Ashley is a 78 y.o. year old female who is a primary care patient of Baxley, Ronal PARAS, MD and is actively engaged with the care management team. I reached out to Will GORMAN Maiden by phone today to assist with re-scheduling  with the RN Case Manager.  Follow up plan: Unsuccessful telephone outreach attempt made. A HIPAA compliant phone message was left for the patient providing contact information and requesting a return call.  [facility]  Care Coordination Care Guide  Direct Dial: [phone]

## 2023-01-17 NOTE — Progress Notes (Signed)
 Complex Care Management Care Guide Note  01/17/2023 Name: Ebony Ashley MRN: [id number] DOB: 1945/09/22  Ebony Ashley is a 78 y.o. year old female who is a primary care patient of Baxley, Ronal PARAS, MD and is actively engaged with the care management team. I reached out to Will GORMAN Maiden by phone today to assist with re-scheduling  with the RN Case Manager.  Follow up plan: Patient changed insurance and has new Primary Care Physcian Dr Myriam and patient is not attributed to [facility] no further outreaches will be made.   [facility]  Care Coordination Care Guide  Direct Dial: [phone]

## 2023-01-24 ENCOUNTER — Telehealth: Payer: Self-pay | Admitting: Internal Medicine

## 2023-01-24 NOTE — Telephone Encounter (Signed)
Copied from CRM [id number]. Topic: General - Inquiry >> Jan 20, 2023 12:05 PM Suzette B wrote: Reason for CRM: patient called in she was extremely upset she didn't understand why she was being called I attempted to read her the note in the chart several times In which she continued to cut me off explaining she does not like the new phone systems I apologized for the inconvenience.  Please have someone from the clinic call patient and advise her on what the phone call was for from the nurse tech.

## 2023-01-24 NOTE — Telephone Encounter (Signed)
Patient called back and was informed that the calls were about her upcoming appointments.

## 2023-01-27 ENCOUNTER — Other Ambulatory Visit: Payer: PPO

## 2023-01-28 ENCOUNTER — Ambulatory Visit: Payer: PPO | Admitting: Internal Medicine
